# Patient Record
Sex: Male | Born: 1955 | Race: White | Hispanic: No | Marital: Married | Smoking: Current every day smoker
Health system: Southern US, Community
[De-identification: ages and names within clinical notes are randomized; demographics above are authoritative.]

## PROBLEM LIST (undated history)

## (undated) DIAGNOSIS — I4510 Unspecified right bundle-branch block: Secondary | ICD-10-CM

## (undated) DIAGNOSIS — E785 Hyperlipidemia, unspecified: Secondary | ICD-10-CM

## (undated) DIAGNOSIS — M1990 Unspecified osteoarthritis, unspecified site: Secondary | ICD-10-CM

## (undated) HISTORY — PX: COLONOSCOPY: SHX174

## (undated) HISTORY — DX: Hyperlipidemia, unspecified: E78.5

## (undated) HISTORY — DX: Unspecified right bundle-branch block: I45.10

---

## 2013-01-19 ENCOUNTER — Encounter: Payer: Self-pay | Admitting: Emergency Medicine

## 2013-01-19 ENCOUNTER — Ambulatory Visit (INDEPENDENT_AMBULATORY_CARE_PROVIDER_SITE_OTHER): Payer: PRIVATE HEALTH INSURANCE | Admitting: Emergency Medicine

## 2013-01-19 VITALS — BP 128/70 | HR 58 | Temp 98.7°F | Resp 18 | Ht 68.5 in | Wt 199.0 lb

## 2013-01-19 DIAGNOSIS — Z0000 Encounter for general adult medical examination without abnormal findings: Secondary | ICD-10-CM

## 2013-01-19 LAB — POCT CBC
GRANULOCYTE PERCENT: 55.8 % (ref 37–80)
HEMATOCRIT: 46.6 % (ref 43.5–53.7)
Hemoglobin: 14.9 g/dL (ref 14.1–18.1)
LYMPH, POC: 3 (ref 0.6–3.4)
MCH: 30.7 pg (ref 27–31.2)
MCHC: 32 g/dL (ref 31.8–35.4)
MCV: 95.8 fL (ref 80–97)
MID (CBC): 0.7 (ref 0–0.9)
MPV: 9.7 fL (ref 0–99.8)
PLATELET COUNT, POC: 189 10*3/uL (ref 142–424)
POC Granulocyte: 4.7 (ref 2–6.9)
POC LYMPH %: 36 % (ref 10–50)
POC MID %: 8.2 %M (ref 0–12)
RBC: 4.86 M/uL (ref 4.69–6.13)
RDW, POC: 13.4 %
WBC: 8.4 10*3/uL (ref 4.6–10.2)

## 2013-01-19 LAB — COMPREHENSIVE METABOLIC PANEL
ALT: 32 U/L (ref 0–53)
AST: 19 U/L (ref 0–37)
Albumin: 4.6 g/dL (ref 3.5–5.2)
Alkaline Phosphatase: 53 U/L (ref 39–117)
BILIRUBIN TOTAL: 0.7 mg/dL (ref 0.3–1.2)
BUN: 14 mg/dL (ref 6–23)
CO2: 30 mEq/L (ref 19–32)
CREATININE: 0.71 mg/dL (ref 0.50–1.35)
Calcium: 10 mg/dL (ref 8.4–10.5)
Chloride: 101 mEq/L (ref 96–112)
Glucose, Bld: 101 mg/dL — ABNORMAL HIGH (ref 70–99)
Potassium: 4.8 mEq/L (ref 3.5–5.3)
SODIUM: 140 meq/L (ref 135–145)
TOTAL PROTEIN: 7.4 g/dL (ref 6.0–8.3)

## 2013-01-19 LAB — POCT URINALYSIS DIPSTICK
Bilirubin, UA: NEGATIVE
Blood, UA: NEGATIVE
Glucose, UA: NEGATIVE
KETONES UA: NEGATIVE
Leukocytes, UA: NEGATIVE
Nitrite, UA: NEGATIVE
PH UA: 6
Protein, UA: NEGATIVE
SPEC GRAV UA: 1.015
Urobilinogen, UA: 0.2

## 2013-01-19 LAB — POCT UA - MICROSCOPIC ONLY
Bacteria, U Microscopic: NEGATIVE
CRYSTALS, UR, HPF, POC: NEGATIVE
Casts, Ur, LPF, POC: NEGATIVE
MUCUS UA: NEGATIVE
YEAST UA: NEGATIVE

## 2013-01-19 LAB — LIPID PANEL
Cholesterol: 203 mg/dL — ABNORMAL HIGH (ref 0–200)
HDL: 37 mg/dL — AB (ref >39)
LDL Cholesterol: 132 mg/dL — ABNORMAL HIGH (ref 0–99)
TRIGLYCERIDES: 171 mg/dL — AB (ref <150)
Total CHOL/HDL Ratio: 5.5 Ratio
VLDL: 34 mg/dL (ref 0–40)

## 2013-01-19 LAB — IFOBT (OCCULT BLOOD): IFOBT: NEGATIVE

## 2013-01-19 LAB — TSH: TSH: 0.908 u[IU]/mL (ref 0.350–4.500)

## 2013-01-19 LAB — PSA: PSA: 1.03 ng/mL (ref <=4.00)

## 2013-01-19 MED ORDER — HYDROCORTISONE ACETATE 25 MG RE SUPP: 25.0000 mg | Freq: Two times a day (BID) | RECTAL | Status: DC

## 2013-01-19 NOTE — Progress Notes (Addendum)
[facility] [address], [place] [address] [phone]  Date:  01/19/2013   Name:  Raymond Velez   DOB:  1955/04/03   MRN:  [id number]  PCP:  No primary provider on file.    Chief Complaint: No chief complaint on file.   History of Present Illness:  Raymond Velez is a 58 y.o. very pleasant male patient who presents with the following:  For wellness examination. No medication. Daily smoker.  Overdue for colonoscopy.  Works as a [employer].  Denies other complaint or health concern today.   There are no active problems to display for this patient.   No past medical history on file.  No past surgical history on file.  History  Substance Use Topics  . Smoking status: Current Every Day Smoker    Types: Cigarettes  . Smokeless tobacco: Not on file  . Alcohol Use: Not on file    No family history on file.  Allergies not on file  Medication list has been reviewed and updated.  No current outpatient prescriptions on file prior to visit.   No current facility-administered medications on file prior to visit.    Review of Systems:  As per HPI, otherwise negative.    Physical Examination: Filed Vitals:   01/19/13 0800  BP: 128/70  Pulse: 58  Temp: 98.7 F (37.1 C)  Resp: 18   Filed Vitals:   01/19/13 0800  Height: 5' 8.5" (1.74 m)  Weight: 199 lb (90.266 kg)   Body mass index is 29.81 kg/(m^2). Ideal Body Weight: Weight in (lb) to have BMI = 25: 166.5  GEN: WDWN, NAD, Non-toxic, A & O x 3 HEENT: Atraumatic, Normocephalic. Neck supple. No masses, No LAD. Ears and Nose: No external deformity. CV: RRR, No M/G/R. No JVD. No thrill. No extra heart sounds. PULM: CTA B, no wheezes, crackles, rhonchi. No retractions. No resp. distress. No accessory muscle use. ABD: S, NT, ND, +BS. No rebound. No HSM. EXTR: No c/c/e NEURO Normal gait.  PSYCH: Normally interactive. Conversant. Not depressed or anxious appearing.  Calm demeanor.   DRE:  External hemorrhoids  Assessment and Plan: Hemorrhoids Labs Follow up pending labs Stop smoking   Signed,  Phillips Odor, MD   Results for orders placed in visit on 01/19/13  PSA      Result Value Range   PSA 1.03  <=4.00 ng/mL  TSH      Result Value Range   TSH 0.908  0.350 - 4.500 uIU/mL  LIPID PANEL      Result Value Range   Cholesterol 203 (*) 0 - 200 mg/dL   Triglycerides 295 (*) <150 mg/dL   HDL 37 (*) >62 mg/dL   Total CHOL/HDL Ratio 5.5     VLDL 34  0 - 40 mg/dL   LDL Cholesterol 130 (*) 0 - 99 mg/dL  COMPREHENSIVE METABOLIC PANEL      Result Value Range   Sodium 140  135 - 145 mEq/L   Potassium 4.8  3.5 - 5.3 mEq/L   Chloride 101  96 - 112 mEq/L   CO2 30  19 - 32 mEq/L   Glucose, Bld 101 (*) 70 - 99 mg/dL   BUN 14  6 - 23 mg/dL   Creat 8.65  7.84 - 6.96 mg/dL   Total Bilirubin 0.7  0.3 - 1.2 mg/dL   Alkaline Phosphatase 53  39 - 117 U/L   AST 19  0 - 37 U/L  ALT 32  0 - 53 U/L   Total Protein 7.4  6.0 - 8.3 g/dL   Albumin 4.6  3.5 - 5.2 g/dL   Calcium 16.110.0  8.4 - 09.610.5 mg/dL  POCT CBC      Result Value Range   WBC 8.4  4.6 - 10.2 K/uL   Lymph, poc 3.0  0.6 - 3.4   POC LYMPH PERCENT 36.0  10 - 50 %L   MID (cbc) 0.7  0 - 0.9   POC MID % 8.2  0 - 12 %M   POC Granulocyte 4.7  2 - 6.9   Granulocyte percent 55.8  37 - 80 %G   RBC 4.86  4.69 - 6.13 M/uL   Hemoglobin 14.9  14.1 - 18.1 g/dL   HCT, POC 04.546.6  40.943.5 - 53.7 %   MCV 95.8  80 - 97 fL   MCH, POC 30.7  27 - 31.2 pg   MCHC 32.0  31.8 - 35.4 g/dL   RDW, POC 81.113.4     Platelet Count, POC 189  142 - 424 K/uL   MPV 9.7  0 - 99.8 fL  POCT URINALYSIS DIPSTICK      Result Value Range   Color, UA yellow     Clarity, UA clear     Glucose, UA neg     Bilirubin, UA neg     Ketones, UA neg     Spec Grav, UA 1.015     Blood, UA neg     pH, UA 6.0     Protein, UA neg     Urobilinogen, UA 0.2     Nitrite, UA neg     Leukocytes, UA Negative    POCT UA - MICROSCOPIC ONLY      Result Value  Range   WBC, Ur, HPF, POC 0-1     RBC, urine, microscopic 0-2     Bacteria, U Microscopic neg     Mucus, UA neg     Epithelial cells, urine per micros 0-3     Crystals, Ur, HPF, POC neg     Casts, Ur, LPF, POC neg     Yeast, UA neg    IFOBT (OCCULT BLOOD)      Result Value Range   IFOBT Negative

## 2013-01-19 NOTE — Patient Instructions (Signed)
Hemorrhoids Hemorrhoids are swollen veins around the rectum or anus. There are two types of hemorrhoids:   Internal hemorrhoids. These occur in the veins just inside the rectum. They may poke through to the outside and become irritated and painful.  External hemorrhoids. These occur in the veins outside the anus and can be felt as a painful swelling or hard lump near the anus. CAUSES  Pregnancy.   Obesity.   Constipation or diarrhea.   Straining to have a bowel movement.   Sitting for long periods on the toilet.  Heavy lifting or other activity that caused you to strain.  Anal intercourse. SYMPTOMS   Pain.   Anal itching or irritation.   Rectal bleeding.   Fecal leakage.   Anal swelling.   One or more lumps around the anus.  DIAGNOSIS  Your caregiver may be able to diagnose hemorrhoids by visual examination. Other examinations or tests that may be performed include:   Examination of the rectal area with a gloved hand (digital rectal exam).   Examination of anal canal using a small tube (scope).   A blood test if you have lost a significant amount of blood.  A test to look inside the colon (sigmoidoscopy or colonoscopy). TREATMENT Most hemorrhoids can be treated at home. However, if symptoms do not seem to be getting better or if you have a lot of rectal bleeding, your caregiver may perform a procedure to help make the hemorrhoids get smaller or remove them completely. Possible treatments include:   Placing a rubber band at the base of the hemorrhoid to cut off the circulation (rubber band ligation).   Injecting a chemical to shrink the hemorrhoid (sclerotherapy).   Using a tool to burn the hemorrhoid (infrared light therapy).   Surgically removing the hemorrhoid (hemorrhoidectomy).   Stapling the hemorrhoid to block blood flow to the tissue (hemorrhoid stapling).  HOME CARE INSTRUCTIONS   Eat foods with fiber, such as whole grains, beans,  nuts, fruits, and vegetables. Ask your doctor about taking products with added fiber in them (fibersupplements).  Increase fluid intake. Drink enough water and fluids to keep your urine clear or pale yellow.   Exercise regularly.   Go to the bathroom when you have the urge to have a bowel movement. Do not wait.   Avoid straining to have bowel movements.   Keep the anal area dry and clean. Use wet toilet paper or moist towelettes after a bowel movement.   Medicated creams and suppositories may be used or applied as directed.   Only take over-the-counter or prescription medicines as directed by your caregiver.   Take warm sitz baths for 15 20 minutes, 3 4 times a day to ease pain and discomfort.   Place ice packs on the hemorrhoids if they are tender and swollen. Using ice packs between sitz baths may be helpful.   Put ice in a plastic bag.   Place a towel between your skin and the bag.   Leave the ice on for 15 20 minutes, 3 4 times a day.   Do not use a donut-shaped pillow or sit on the toilet for long periods. This increases blood pooling and pain.  SEEK MEDICAL CARE IF:  You have increasing pain and swelling that is not controlled by treatment or medicine.  You have uncontrolled bleeding.  You have difficulty or you are unable to have a bowel movement.  You have pain or inflammation outside the area of the hemorrhoids. MAKE SURE YOU:    Understand these instructions.  Will watch your condition.  Will get help right away if you are not doing well or get worse. Document Released: 12/25/1999 Document Revised: 12/14/2011 Document Reviewed: 11/01/2011 [employer] Patient Information 2014 [employer].  

## 2013-01-19 NOTE — Addendum Note (Signed)
Addended by: Vira AgarFARRINGTON, Chizuko Trine L on: 01/19/2013 08:36 AM   Modules accepted: Orders

## 2013-02-21 ENCOUNTER — Telehealth: Payer: Self-pay

## 2013-02-21 NOTE — Telephone Encounter (Signed)
Patient was seen for a CPE in January and he states he has not gotten any of his results back yet. States he would like a letter with all the documentation. States that a phone call is not sufficient enough.  [phone]

## 2013-03-15 ENCOUNTER — Encounter: Payer: Self-pay | Admitting: Emergency Medicine

## 2013-05-13 ENCOUNTER — Encounter: Payer: Self-pay | Admitting: Internal Medicine

## 2013-05-13 ENCOUNTER — Ambulatory Visit (INDEPENDENT_AMBULATORY_CARE_PROVIDER_SITE_OTHER): Payer: PRIVATE HEALTH INSURANCE | Admitting: Internal Medicine

## 2013-05-13 ENCOUNTER — Ambulatory Visit: Payer: PRIVATE HEALTH INSURANCE | Admitting: Internal Medicine

## 2013-05-13 VITALS — BP 120/74 | HR 75 | Temp 97.8°F | Resp 18 | Ht 68.0 in | Wt 196.0 lb

## 2013-05-13 VITALS — BP 120/74 | HR 75 | Temp 97.8°F | Resp 16 | Ht 68.0 in | Wt 196.0 lb

## 2013-05-13 DIAGNOSIS — B958 Unspecified staphylococcus as the cause of diseases classified elsewhere: Secondary | ICD-10-CM

## 2013-05-13 DIAGNOSIS — Z0289 Encounter for other administrative examinations: Secondary | ICD-10-CM

## 2013-05-13 DIAGNOSIS — L738 Other specified follicular disorders: Secondary | ICD-10-CM

## 2013-05-13 DIAGNOSIS — L739 Follicular disorder, unspecified: Secondary | ICD-10-CM

## 2013-05-13 DIAGNOSIS — L678 Other hair color and hair shaft abnormalities: Secondary | ICD-10-CM

## 2013-05-13 MED ORDER — DOXYCYCLINE HYCLATE 100 MG PO TABS: 100.0000 mg | ORAL_TABLET | Freq: Two times a day (BID) | ORAL | Status: DC

## 2013-05-13 NOTE — Progress Notes (Signed)
   Subjective:    Patient ID: Raymond Velez, male    DOB: February 07, 1955, 58 y.o.   MRN: [id number]  HPI Healthy, on no meds.   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

## 2013-05-13 NOTE — Patient Instructions (Signed)
Folliculitis  Folliculitis is redness, soreness, and swelling (inflammation) of the hair follicles. This condition can occur anywhere on the body. People with weakened immune systems, diabetes, or obesity have a greater risk of getting folliculitis. CAUSES  Bacterial infection. This is the most common cause.  Fungal infection.  Viral infection.  Contact with certain chemicals, especially oils and tars. Long-term folliculitis can result from bacteria that live in the nostrils. The bacteria may trigger multiple outbreaks of folliculitis over time. SYMPTOMS Folliculitis most commonly occurs on the scalp, thighs, legs, back, buttocks, and areas where hair is shaved frequently. An early sign of folliculitis is a small, white or yellow, pus-filled, itchy lesion (pustule). These lesions appear on a red, inflamed follicle. They are usually less than 0.2 inches (5 mm) wide. When there is an infection of the follicle that goes deeper, it becomes a boil or furuncle. A group of closely packed boils creates a larger lesion (carbuncle). Carbuncles tend to occur in hairy, sweaty areas of the body. DIAGNOSIS  Your caregiver can usually tell what is wrong by doing a physical exam. A sample may be taken from one of the lesions and tested in a lab. This can help determine what is causing your folliculitis. TREATMENT  Treatment may include:  Applying warm compresses to the affected areas.  Taking antibiotic medicines orally or applying them to the skin.  Draining the lesions if they contain a large amount of pus or fluid.  Laser hair removal for cases of long-lasting folliculitis. This helps to prevent regrowth of the hair. HOME CARE INSTRUCTIONS  Apply warm compresses to the affected areas as directed by your caregiver.  If antibiotics are prescribed, take them as directed. Finish them even if you start to feel better.  You may take over-the-counter medicines to relieve itching.  Do not shave  irritated skin.  Follow up with your caregiver as directed. SEEK IMMEDIATE MEDICAL CARE IF:   You have increasing redness, swelling, or pain in the affected area.  You have a fever. MAKE SURE YOU:  Understand these instructions.  Will watch your condition.  Will get help right away if you are not doing well or get worse. Document Released: 03/07/2001 Document Revised: 06/28/2011 Document Reviewed: 03/29/2011 [facility] Patient Information 2014 [place], [place]. MRSA Overview MRSA stands for methicillin-resistant Staphylococcus aureus. It is a type of bacteria that is resistant to some common antibiotics. It can cause infections in the skin and many other places in the body. Staphylococcus aureus, often called "staph," is a bacteria that normally lives on the skin or in the nose. Staph on the surface of the skin or in the nose does not cause problems. However, if the staph enters the body through a cut, wound, or break in the skin, an infection can happen. Up until recently, infections with the MRSA type of staph mainly occurred in hospitals and other healthcare settings. There are now increasing problems with MRSA infections in the community as well. Infections with MRSA may be very serious or even life-threatening. Most MRSA infections are acquired in one of two ways:  Healthcare-associated MRSA (HA-MRSA)  This can be acquired by people in any healthcare setting. MRSA can be a big problem for hospitalized people, people in nursing homes, people in rehabilitation facilities, people with weakened immune systems, dialysis patients, and those who have had surgery.  Community-associated MRSA (CA-MRSA)  Community spread of MRSA is becoming more common. It is known to spread in crowded settings, in jails and prisons, and  in situations where there is close skin-to-skin contact, such as during sporting events or in locker rooms. MRSA can be spread through shared items, such as children's toys,  razors, towels, or sports equipment. CAUSES  All staph, including MRSA, are normally harmless unless they enter the body through a scratch, cut, or wound, such as with surgery. All staph, including MRSA, can be spread from person-to-person by touching contaminated objects or through direct contact. SPECIAL GROUPS MRSA can present problems for special groups of people. Some of these groups include:  Breastfeeding women.  The most common problem is MRSA infection of the breast (mastitis). There is evidence that MRSA can be passed to an infant from infected breast milk. Your caregiver may recommend that you stop breastfeeding until the mastitis is under control.  If you are breastfeeding and have a MRSA infection in a place other than the breast, you may usually continue breastfeeding while under treatment. If taking antibiotics, ask your caregiver if it is safe to continue breastfeeding while taking your prescribed medicines.  Neonates (babies from birth to 76 month old) and infants (babies from 38 month to 58 year old).  There is evidence that MRSA can be passed to a newborn at birth if the mother has MRSA on the skin, in or around the birth canal, or an infection in the uterus, cervix, or vagina. MRSA infection can have the same appearance as a normal newborn or infant rash or several other skin infections. This can make it hard to diagnose MRSA.  Immune compromised people.  If you have an immune system problem, you may have a higher chance of developing a MRSA infection.  People after any type of surgery.  Staph in general, including MRSA, is the most common cause of infections occurring at the site of recent surgery.  People on long-term steroid medicines.  These kinds of medicines can lower your resistance to infection. This can increase your chance of getting MRSA.  People who have had frequent hospitalizations, live in nursing homes or other residential care facilities, have venous or  urinary catheters, or have taken multiple courses of antibiotic therapy for any reason. DIAGNOSIS  Diagnosis of MRSA is done by cultures of fluid samples that may come from:  Swabs taken from cuts or wounds in infected areas.  Nasal swabs.  Saliva or deep cough specimens from the lungs (sputum).  Urine.  Blood. Many people are "colonized" with MRSA but have no signs of infection. This means that people carry the MRSA germ on their skin or in their nose and may never develop MRSA infection.  TREATMENT  Treatment varies and is based on how serious, how deep, or how extensive the infection is. For example:  Some skin infections, such as a small boil or abscess, may be treated by draining yellowish-white fluid (pus) from the site of the infection.  Deeper or more widespread soft tissue infections are usually treated with surgery to drain pus and with antibiotic medicine given by vein or by mouth. This may be recommended even if you are pregnant.  Serious infections may require a hospital stay. If antibiotics are given, they may be needed for several weeks. PREVENTION  Because many people are colonized with staph, including MRSA, preventing the spread of the bacteria from person-to-person is most important. The best way to prevent the spread of bacteria and other germs is through proper hand washing or by using alcohol-based hand disinfectants. The following are other ways to help prevent MRSA infection  within the hospital and community settings.   Healthcare settings:  Strict hand washing or hand disinfection procedures need to be followed before and after touching every patient.  Patients infected with MRSA are placed in isolation to prevent the spread of the bacteria.  Healthcare workers need to wear disposable gowns and gloves when touching or caring for patients infected with MRSA. Visitors may also be asked to wear a gown and gloves.  Hospital surfaces need to be disinfected  frequently.  Community settings:  NIKEWash your hands frequently with soap and water for at least 15 seconds. Otherwise, use alcohol-based hand disinfectants when soap and water is not available.  Make sure people who live with you wash their hands often, too.  Do not share personal items. For example, avoid sharing razors and other personal hygiene items, towels, clothing, and athletic equipment.  Wash and dry your clothes and bedding at the warmest temperatures recommended on the labels.  Keep wounds covered. Pus from infected sores may contain MRSA and other bacteria. Keep cuts and abrasions clean and covered with germ-free (sterile), dry bandages until they are healed.  If you have a wound that appears infected, ask your caregiver if a culture for MRSA and other bacteria should be done.  If you are breastfeeding, talk to your caregiver about MRSA. You may be asked to temporarily stop breastfeeding. HOME CARE INSTRUCTIONS   Take your antibiotics as directed. Finish them even if you start to feel better.  Avoid close contact with those around you as much as possible. Do not use towels, razors, toothbrushes, bedding, or other items that will be used by others.  To fight the infection, follow your caregiver's instructions for wound care. Wash your hands before and after changing your bandages.  If you have an intravascular device, such as a catheter, make sure you know how to care for it.  Be sure to tell any healthcare providers that you have MRSA so they are aware of your infection. SEEK IMMEDIATE MEDICAL CARE IF:   The infection appears to be getting worse. Signs include:  Increased warmth, redness, or tenderness around the wound site.  A red line that extends from the infection site.  A dark color in the area around the infection.  Wound drainage that is tan, yellow, or green.  A bad smell coming from the wound.  You feel sick to your stomach (nauseous) and throw up (vomit)  or cannot keep medicine down.  You have a fever.  Your baby is older than 3 months with a rectal temperature of 102 F (38.9 C) or higher.  Your baby is 933 months old or younger with a rectal temperature of 100.4 F (38 C) or higher.  You have difficulty breathing. MAKE SURE YOU:   Understand these instructions.  Will watch your condition.  Will get help right away if you are not doing well or get worse. Document Released: 12/27/2004 Document Revised: 03/21/2011 Document Reviewed: 03/31/2010 [facility] : [facility]ExitCare Patient Information 2014 [place] RidgeExitCare, MarylandLLC.

## 2013-05-13 NOTE — Progress Notes (Signed)
Sp gr-1.010, protein-neg, glucose-neg, blood-neg 

## 2013-05-13 NOTE — Patient Instructions (Signed)
Immunization Schedule, Adult  Influenza vaccine.  All adults should be immunized every year.  All adults, including pregnant women and people with hives-only allergy to eggs can receive the inactivated influenza (IIV) vaccine.  Adults aged 57 49 years can receive the recombinant influenza (RIV) vaccine. The RIV vaccine does not contain any egg protein.  Adults aged 65 years or older can receive the standard-dose IIV or the high-dose IIV.  Tetanus, diphtheria, and acellular pertussis (Td, Tdap) vaccine.  Pregnant women should receive 1 dose of Tdap vaccine during each pregnancy. The dose should be obtained regardless of the length of time since the last dose. Immunization is preferred during the 27th to 36th week of gestation.  An adult who has not previously received Tdap or who does not know his or her vaccine status should receive 1 dose of Tdap. This initial dose should be followed by tetanus and diphtheria toxoids (Td) booster doses every 10 years.  Adults with an unknown or incomplete history of completing a 3-dose immunization series with Td-containing vaccines should begin or complete a primary immunization series including a Tdap dose.  Adults should receive a Td booster every 10 years.  Varicella vaccine.  An adult without evidence of immunity to varicella should receive 2 doses or a second dose if he or she has previously received 1 dose.  Pregnant females who do not have evidence of immunity should receive the first dose after pregnancy. This first dose should be obtained before leaving the health care facility. The second dose should be obtained 4 8 weeks after the first dose.  Human papillomavirus (HPV) vaccine.  Females aged 13 26 years who have not received the vaccine previously should obtain the 3-dose series.  The vaccine is not recommended for use in pregnant females. However, pregnancy testing is not needed before receiving a dose. If a male is found to be  pregnant after receiving a dose, no treatment is needed. In that case, the remaining doses should be delayed until after the pregnancy.  Males aged 13 21 years who have not received the vaccine previously should receive the 3-dose series. Males aged 22 26 years may be immunized.  Immunization is recommended through the age of 26 years for any male who has sex with males and did not get any or all doses earlier.  Immunization is recommended for any person with an immunocompromised condition through the age of 26 years if he or she did not get any or all doses earlier.  During the 3-dose series, the second dose should be obtained 4 8 weeks after the first dose. The third dose should be obtained 24 weeks after the first dose and 16 weeks after the second dose.  Zoster vaccine.  One dose is recommended for adults aged 60 years or older unless certain conditions are present.  Measles, mumps, and rubella (MMR) vaccine.  Adults born before 1957 generally are considered immune to measles and mumps.  Adults born in 1957 or later should have 1 or more doses of MMR vaccine unless there is a contraindication to the vaccine or there is laboratory evidence of immunity to each of the three diseases.  A routine second dose of MMR vaccine should be obtained at least 28 days after the first dose for students attending postsecondary schools, health care workers, or international travelers.  People who received inactivated measles vaccine or an unknown type of measles vaccine during 1963 1967 should receive 2 doses of MMR vaccine.  People who received   inactivated mumps vaccine or an unknown type of mumps vaccine before 1979 and are at high risk for mumps infection should consider immunization with 2 doses of MMR vaccine.  For females of childbearing age, rubella immunity should be determined. If there is no evidence of immunity, females who are not pregnant should be vaccinated. If there is no evidence of  immunity, females who are pregnant should delay immunization until after pregnancy.  Unvaccinated health care workers born before 45 who lack laboratory evidence of measles, mumps, or rubella immunity or laboratory confirmation of disease should consider measles and mumps immunization with 2 doses of MMR vaccine or rubella immunization with 1 dose of MMR vaccine.  Pneumococcal 13-valent conjugate (PCV13) vaccine.  When indicated, a person who is uncertain of his or her immunization history and has no record of immunization should receive the PCV13 vaccine.  An adult aged 38 years or older who has certain medical conditions and has not been previously immunized should receive 1 dose of PCV13 vaccine. This PCV13 should be followed with a dose of pneumococcal polysaccharide (PPSV23) vaccine. The PPSV23 vaccine dose should be obtained at least 8 weeks after the dose of PCV13 vaccine.  An adult aged 33 years or older who has certain medical conditions and previously received 1 or more doses of PPSV23 vaccine should receive 1 dose of PCV13. The PCV13 vaccine dose should be obtained 1 or more years after the last PPSV23 vaccine dose.  Pneumococcal polysaccharide (PPSV23) vaccine.  When PCV13 is also indicated, PCV13 should be obtained first.  All adults aged 71 years and older should be immunized.  An adult younger than age 74 years who has certain medical conditions should be immunized.  Any person who resides in a nursing home or long-term care facility should be immunized.  An adult smoker should be immunized.  People with an immunocompromised condition and certain other conditions should receive both PCV13 and PPSV23 vaccines.  People with human immunodeficiency virus (HIV) infection should be immunized as soon as possible after diagnosis.  Immunization during chemotherapy or radiation therapy should be avoided.  Routine use of PPSV23 vaccine is not recommended for American Indians,  [place] Natives, or people younger than 65 years unless there are medical conditions that require PPSV23 vaccine.  When indicated, people who have unknown immunization and have no record of immunization should receive PPSV23 vaccine.  One-time revaccination 5 years after the first dose of PPSV23 is recommended for people aged 22 64 years who have chronic kidney failure, nephrotic syndrome, asplenia, or immunocompromised conditions.  People who received 1 2 doses of PPSV23 before age 69 years should receive another dose of PPSV23 vaccine at age 70 years or later if at least 5 years have passed since the previous dose.  Doses of PPSV23 are not needed for people immunized with PPSV23 at or after age 66 years.  Meningococcal vaccine.  Adults with asplenia or persistent complement component deficiencies should receive 2 doses of quadrivalent meningococcal conjugate (MenACWY-D) vaccine. The doses should be obtained at least 2 months apart.  Microbiologists working with certain meningococcal bacteria, [place] recruits, people at risk during an outbreak, and people who travel to or live in countries with a high rate of meningitis should be immunized.  A first-year college student up through age 74 years who is living in a residence hall should receive a dose if he or she did not receive a dose on or after his or her 16th birthday.  Adults who have  certain high-risk conditions should receive one or more doses of vaccine.  Hepatitis A vaccine.  Adults who wish to be protected from this disease, have certain high-risk conditions, work with hepatitis A-infected animals, work in hepatitis A research labs, or travel to or work in countries with a high rate of hepatitis A should be immunized.  Adults who were previously unvaccinated and who anticipate close contact with an international adoptee during the first 60 days after arrival in the [place] from a country with a high rate of hepatitis A should  be immunized.  Hepatitis B vaccine.  Adults who wish to be protected from this disease, have certain high-risk conditions, may be exposed to blood or other infectious body fluids, are household contacts or sex partners of hepatitis B positive people, are clients or workers in certain care facilities, or travel to or work in countries with a high rate of hepatitis B should be immunized.  Haemophilus influenzae type b (Hib) vaccine.  A previously unvaccinated person with asplenia or sickle cell disease or having a scheduled splenectomy should receive 1 dose of Hib vaccine.  Regardless of previous immunization, a recipient of a hematopoietic stem cell transplant should receive a 3-dose series 6 12 months after his or her successful transplant.  Hib vaccine is not recommended for adults with HIV infection. Document Released: 03/19/2003 Document Revised: 04/23/2012 Document Reviewed: 02/14/2012 [facility] Patient Information 2014 [place], [place].

## 2013-05-13 NOTE — Progress Notes (Signed)
   Subjective:    Patient ID: Raymond Velez, male    DOB: Aug 27, 1955, 58 y.o.   MRN: [id number]  HPI 58 yr old male is today with complaints of a rash between his right testicle and anus area for the last 3-4 weeks. He states he has tried neosporin with no relief. He states he has not ran a fever or changes laundry detergent, soaps or lotion in the last 3-4 weeks.    Review of Systems     Objective:   Physical Exam        Assessment & Plan:  Folliculitis Soap and water/Doxycycline for 2 weeks

## 2014-02-20 ENCOUNTER — Ambulatory Visit (INDEPENDENT_AMBULATORY_CARE_PROVIDER_SITE_OTHER): Payer: PRIVATE HEALTH INSURANCE

## 2014-02-20 ENCOUNTER — Ambulatory Visit (INDEPENDENT_AMBULATORY_CARE_PROVIDER_SITE_OTHER): Payer: PRIVATE HEALTH INSURANCE | Admitting: Urgent Care

## 2014-02-20 VITALS — BP 128/80 | HR 59 | Temp 97.7°F | Resp 18 | Ht 68.5 in | Wt 206.4 lb

## 2014-02-20 DIAGNOSIS — R059 Cough, unspecified: Secondary | ICD-10-CM

## 2014-02-20 DIAGNOSIS — F17200 Nicotine dependence, unspecified, uncomplicated: Secondary | ICD-10-CM

## 2014-02-20 DIAGNOSIS — Z720 Tobacco use: Secondary | ICD-10-CM

## 2014-02-20 DIAGNOSIS — E785 Hyperlipidemia, unspecified: Secondary | ICD-10-CM

## 2014-02-20 DIAGNOSIS — R911 Solitary pulmonary nodule: Secondary | ICD-10-CM

## 2014-02-20 DIAGNOSIS — R05 Cough: Secondary | ICD-10-CM

## 2014-02-20 DIAGNOSIS — Z0000 Encounter for general adult medical examination without abnormal findings: Secondary | ICD-10-CM

## 2014-02-20 DIAGNOSIS — J988 Other specified respiratory disorders: Secondary | ICD-10-CM

## 2014-02-20 DIAGNOSIS — R350 Frequency of micturition: Secondary | ICD-10-CM

## 2014-02-20 LAB — CBC
HEMATOCRIT: 44.1 % (ref 39.0–52.0)
HEMOGLOBIN: 15.2 g/dL (ref 13.0–17.0)
MCH: 31.1 pg (ref 26.0–34.0)
MCHC: 34.5 g/dL (ref 30.0–36.0)
MCV: 90.2 fL (ref 78.0–100.0)
MPV: 11.9 fL (ref 8.6–12.4)
Platelets: 200 10*3/uL (ref 150–400)
RBC: 4.89 MIL/uL (ref 4.22–5.81)
RDW: 13.1 % (ref 11.5–15.5)
WBC: 7.6 10*3/uL (ref 4.0–10.5)

## 2014-02-20 LAB — COMPREHENSIVE METABOLIC PANEL
ALK PHOS: 56 U/L (ref 39–117)
ALT: 27 U/L (ref 0–53)
AST: 20 U/L (ref 0–37)
Albumin: 4.6 g/dL (ref 3.5–5.2)
BUN: 12 mg/dL (ref 6–23)
CO2: 30 meq/L (ref 19–32)
Calcium: 10 mg/dL (ref 8.4–10.5)
Chloride: 102 mEq/L (ref 96–112)
Creat: 0.78 mg/dL (ref 0.50–1.35)
Glucose, Bld: 102 mg/dL — ABNORMAL HIGH (ref 70–99)
POTASSIUM: 4.7 meq/L (ref 3.5–5.3)
SODIUM: 138 meq/L (ref 135–145)
Total Bilirubin: 0.6 mg/dL (ref 0.2–1.2)
Total Protein: 7.6 g/dL (ref 6.0–8.3)

## 2014-02-20 LAB — POCT URINALYSIS DIPSTICK
Bilirubin, UA: NEGATIVE
Glucose, UA: NEGATIVE
KETONES UA: NEGATIVE
Nitrite, UA: NEGATIVE
PH UA: 6.5
Protein, UA: NEGATIVE
RBC UA: NEGATIVE
SPEC GRAV UA: 1.01
Urobilinogen, UA: 0.2

## 2014-02-20 LAB — LIPID PANEL
CHOL/HDL RATIO: 5.6 ratio
Cholesterol: 192 mg/dL (ref 0–200)
HDL: 34 mg/dL — AB (ref >39)
LDL Cholesterol: 122 mg/dL — ABNORMAL HIGH (ref 0–99)
Triglycerides: 180 mg/dL — ABNORMAL HIGH (ref <150)
VLDL: 36 mg/dL (ref 0–40)

## 2014-02-20 LAB — POCT UA - MICROSCOPIC ONLY
BACTERIA, U MICROSCOPIC: NEGATIVE
CRYSTALS, UR, HPF, POC: NEGATIVE
Casts, Ur, LPF, POC: NEGATIVE
Epithelial cells, urine per micros: NEGATIVE
Mucus, UA: NEGATIVE
RBC, URINE, MICROSCOPIC: NEGATIVE
Yeast, UA: NEGATIVE

## 2014-02-20 LAB — POCT GLYCOSYLATED HEMOGLOBIN (HGB A1C): HEMOGLOBIN A1C: 5.7

## 2014-02-20 MED ORDER — AZITHROMYCIN 500 MG PO TABS: 500.0000 mg | ORAL_TABLET | Freq: Every day | ORAL | Status: DC

## 2014-02-20 NOTE — Progress Notes (Signed)
MRN: [id number]  Subjective:   [name] is a 59 y.o. male presenting for annual physical exam and cough, urinary frequency.  Medical care team includes:  PCP: No PCP Per Patient, last physical 1 year ago Vision: Eye care appointment ~2 years ago, wears glasses, denies vision changes. Dental: Wears partial dentures, does not get regular dental care. Specialists:   Colonoscopy: Has had 3, had a repeat 2015, with 3 non-cancerous polyps excised, recommended repeat in 3 years.  Concerns: Urinary frequency - reports 3 months history of difficulty urinating, weakened stream, occasional painful erections. Has not tried any interventions. Denies fever, rash, swelling, erythema, discharge, dysuria, post-mic dribble, nocturia, lesions, testicular masses. Patient is married, has monogamous relationship to best of his knowledge, denies history of STI. Previously had similar symptoms, saw urologist ~6 years ago and was treated with an antibiotic, symptoms resolved thereafter. Of note, patient [facility], 02/20/2014, will go straight there after today.  Cough - reports several week history of dry hacking cough, has coughing fits, causes him to "swallow spit". Has not tried any interventions. ROS as above, also denies sinus pain, congestion, ear pain, itchy or watery eyes, tooth pain, sore throat, voice hoarseness, night sweats, chest pain, chest tightness, wheezing, shob, n/v, abdominal pain, weight loss. Denies history of asthma, COPD, allergies. Patient currently smokes >1.5ppd, has 20+ pack smoking history. Patient had quit previously but picked up smoking again ~5 years ago. He admits that this has been an issue with him and his family, promised he would quit for the new year and lied about it. Would like help to quit smoking.  Raymond Velez does not have any active problems on the problem list.  Health Maintenance: Diet is unhealthy, works as a  Hospital doctordriver for a living and eats whatever is convenient, exercises 1-2 times a week, fast-paced walking, admits that he should probably do more.  Immunizations: Does not get flu shot, last TDAP ~17 years ago  [place] currently takes Swiss Kriss herbal supplement as needed for constipation. He has No Known Allergies  His past medical history includes hyperlipidemia. History reviewed. No pertinent past surgical history.  Review of Systems  Constitutional: Negative for malaise/fatigue.  HENT: Negative for tinnitus.   Eyes: Negative for blurred vision and double vision.  Gastrointestinal: Negative for heartburn, constipation and blood in stool.  Musculoskeletal: Negative for back pain, joint pain and neck pain.  Neurological: Negative for dizziness, weakness and headaches.  Psychiatric/Behavioral: Negative for depression. The patient is not nervous/anxious and does not have insomnia.   Otherwise as above.   Objective:   Vitals: BP 128/80 mmHg  Pulse 59  Temp(Src) 97.7 F (36.5 C) (Oral)  Resp 18  Ht 5' 8.5" (1.74 m)  Wt 206 lb 6.4 oz (93.622 kg)  BMI 30.92 kg/m2  SpO2 99%  Physical Exam  Constitutional: He is oriented to person, place, and time and well-developed, well-nourished, and in no distress.  HENT:  TM's intact bilaterally, no effusions or erythema. Nasal passages patent, turbinates pink and moist. Oropharynx clear, wearing partial upper dentures, no tonsillar edema or exudates. Mucous membranes moist.  Eyes: Conjunctivae and EOM are normal. Pupils are equal, round, and reactive to light. Right eye exhibits no discharge. Left eye exhibits no discharge. No scleral icterus.  Neck: Normal range of motion. No thyromegaly (no masses palpated) present.  Cardiovascular: Normal rate, regular rhythm, normal heart sounds and intact distal pulses.  Exam reveals no gallop  and no friction rub.   No murmur heard. Pulmonary/Chest: Effort normal and breath sounds normal. No stridor. No  respiratory distress. He has no wheezes. He has no rales. He exhibits no tenderness.  Abdominal: Soft. Bowel sounds are normal. He exhibits no distension and no mass. There is no tenderness.  Genitourinary: He exhibits no abnormal testicular mass, no testicular tenderness, no abnormal scrotal mass, no scrotal tenderness and no epididymal tenderness. Cremasteric reflex is present. Penis exhibits no lesions and no edema. No discharge found.  Patient deferred digital rectal exam since he was going to have his prostate checked with urologist today.  Musculoskeletal: Normal range of motion. He exhibits no edema or tenderness.  Lymphadenopathy:    He has no cervical adenopathy.  Neurological: He is alert and oriented to person, place, and time. He has normal reflexes.  Skin: Skin is warm and dry. No rash noted. No erythema.  Hands with dry skin bilaterally.  Psychiatric: Mood and affect normal.   Results for orders placed or performed in visit on 02/20/14 (from the past 24 hour(s))  POCT urinalysis dipstick     Status: None   Collection Time: 02/20/14  9:30 AM  Result Value Ref Range   Color, UA yellow    Clarity, UA clear    Glucose, UA neg    Bilirubin, UA neg    Ketones, UA neg    Spec Grav, UA 1.010    Blood, UA neg    pH, UA 6.5    Protein, UA neg    Urobilinogen, UA 0.2    Nitrite, UA neg    Leukocytes, UA Trace   POCT glycosylated hemoglobin (Hb A1C)     Status: None   Collection Time: 02/20/14  9:30 AM  Result Value Ref Range   Hemoglobin A1C 5.7   POCT UA - Microscopic Only     Status: None   Collection Time: 02/20/14  9:30 AM  Result Value Ref Range   WBC, Ur, HPF, POC 0-3    RBC, urine, microscopic neg    Bacteria, U Microscopic neg    Mucus, UA neg    Epithelial cells, urine per micros neg    Crystals, Ur, HPF, POC neg    Casts, Ur, LPF, POC neg    Yeast, UA neg    [facility] reading (PRIMARY) by  Dr. Clelia Croft and PA-Tanikka Bresnan. Chest: Right lower lobe infiltrates. Degenerative  changes in thoracic spine.  Dg Chest 2 View  02/20/2014   CLINICAL DATA:  Cough.  EXAM: CHEST  2 VIEW  COMPARISON:  None.  FINDINGS: Mediastinum and hilar structures are normal. Lungs are clear. No pleural effusion or pneumothorax. Calcified right base pulmonary nodule noted consistent with granuloma. No acute bony abnormality .  IMPRESSION: Calcified right base nodule noted consistent with granuloma. No acute cardiopulmonary disease.   Electronically Signed   By: Maisie Fus  Register   On: 02/20/2014 10:09   Assessment and Plan :   1. PE (physical exam), annual - Stable, discussed healthy lifestyle, diet, exercise, preventative care, vaccinations, and addressed patient's concerns. Patient was reluctant to update TDap. Is not interested in flu vaccine. - Discussed smoking cessation and medication options. Patient will try Wellbutrin for this. - Plan for updating his TDAP at follow up in 6 weeks. Patient reluctant to do it today. - Labs pending, consider starting statin therapy for cholesterol. Framingham 10-year risk calculator at ~18% for this patient. - Return for repeat annual exam in 1 year.  2. Urinary  frequency - UA reassuring, will call patient to follow up or urology visit, consider dirty catch urine for STI check  3. Cough 4. Tobacco use disorder 5. Respiratory infection - Start Azithromycin for possible infectious process d/t abnormal chest x-ray, will follow up by phone as above, plan to start Wellburtin for smoking cessation.  UPDATE: chest x-ray over-read reported pulmonary nodule. No previous chest x-ray for comparison. Given extensive smoking history of symptoms, will obtain chest CT.  Wallis Bamberg, PA-C [facility] [phone] 02/20/2014 9:35 AM

## 2014-02-20 NOTE — Patient Instructions (Signed)
Smoking Cessation Quitting smoking is important to your health and has many advantages. However, it is not always easy to quit since nicotine is a very addictive drug. Oftentimes, people try 3 times or more before being able to quit. This document explains the best ways for you to prepare to quit smoking. Quitting takes hard work and a lot of effort, but you can do it. ADVANTAGES OF QUITTING SMOKING  You will live longer, feel better, and live better.  Your body will feel the impact of quitting smoking almost immediately.  Within 20 minutes, blood pressure decreases. Your pulse returns to its normal level.  After 8 hours, carbon monoxide levels in the blood return to normal. Your oxygen level increases.  After 24 hours, the chance of having a heart attack starts to decrease. Your breath, hair, and body stop smelling like smoke.  After 48 hours, damaged nerve endings begin to recover. Your sense of taste and smell improve.  After 72 hours, the body is virtually free of nicotine. Your bronchial tubes relax and breathing becomes easier.  After 2 to 12 weeks, lungs can hold more air. Exercise becomes easier and circulation improves.  The risk of having a heart attack, stroke, cancer, or lung disease is greatly reduced.  After 1 year, the risk of coronary heart disease is cut in half.  After 5 years, the risk of stroke falls to the same as a nonsmoker.  After 10 years, the risk of lung cancer is cut in half and the risk of other cancers decreases significantly.  After 15 years, the risk of coronary heart disease drops, usually to the level of a nonsmoker.  If you are pregnant, quitting smoking will improve your chances of having a healthy baby.  The people you live with, especially any children, will be healthier.  You will have extra money to spend on things other than cigarettes. QUESTIONS TO THINK ABOUT BEFORE ATTEMPTING TO QUIT You may want to talk about your answers with your  health care provider.  Why do you want to quit?  If you tried to quit in the past, what helped and what did not?  What will be the most difficult situations for you after you quit? How will you plan to handle them?  Who can help you through the tough times? Your family? Friends? A health care provider?  What pleasures do you get from smoking? What ways can you still get pleasure if you quit? Here are some questions to ask your health care provider:  How can you help me to be successful at quitting?  What medicine do you think would be best for me and how should I take it?  What should I do if I need more help?  What is smoking withdrawal like? How can I get information on withdrawal? GET READY  Set a quit date.  Change your environment by getting rid of all cigarettes, ashtrays, matches, and lighters in your home, car, or work. Do not let people smoke in your home.  Review your past attempts to quit. Think about what worked and what did not. GET SUPPORT AND ENCOURAGEMENT You have a better chance of being successful if you have help. You can get support in many ways.  Tell your family, friends, and coworkers that you are going to quit and need their support. Ask them not to smoke around you.  Get individual, group, or telephone counseling and support. Programs are available at local hospitals and health centers. Call   your local health department for information about programs in your area.  Spiritual beliefs and practices may help some smokers quit.  Download a "quit meter" on your computer to keep track of quit statistics, such as how long you have gone without smoking, cigarettes not smoked, and money saved.  Get a self-help book about quitting smoking and staying off tobacco. LEARN NEW SKILLS AND BEHAVIORS  Distract yourself from urges to smoke. Talk to someone, go for a walk, or occupy your time with a task.  Change your normal routine. Take a different route to work.  Drink tea instead of coffee. Eat breakfast in a different place.  Reduce your stress. Take a hot bath, exercise, or read a book.  Plan something enjoyable to do every day. Reward yourself for not smoking.  Explore interactive web-based programs that specialize in helping you quit. GET MEDICINE AND USE IT CORRECTLY Medicines can help you stop smoking and decrease the urge to smoke. Combining medicine with the above behavioral methods and support can greatly increase your chances of successfully quitting smoking.  Nicotine replacement therapy helps deliver nicotine to your body without the negative effects and risks of smoking. Nicotine replacement therapy includes nicotine gum, lozenges, inhalers, nasal sprays, and skin patches. Some may be available over-the-counter and others require a prescription.  Antidepressant medicine helps people abstain from smoking, but how this works is unknown. This medicine is available by prescription.  Nicotinic receptor partial agonist medicine simulates the effect of nicotine in your brain. This medicine is available by prescription. Ask your health care provider for advice about which medicines to use and how to use them based on your health history. Your health care provider will tell you what side effects to look out for if you choose to be on a medicine or therapy. Carefully read the information on the package. Do not use any other product containing nicotine while using a nicotine replacement product.  RELAPSE OR DIFFICULT SITUATIONS Most relapses occur within the first 3 months after quitting. Do not be discouraged if you start smoking again. Remember, most people try several times before finally quitting. You may have symptoms of withdrawal because your body is used to nicotine. You may crave cigarettes, be irritable, feel very hungry, cough often, get headaches, or have difficulty concentrating. The withdrawal symptoms are only temporary. They are strongest  when you first quit, but they will go away within 10-14 days. To reduce the chances of relapse, try to:  Avoid drinking alcohol. Drinking lowers your chances of successfully quitting.  Reduce the amount of caffeine you consume. Once you quit smoking, the amount of caffeine in your body increases and can give you symptoms, such as a rapid heartbeat, sweating, and anxiety.  Avoid smokers because they can make you want to smoke.  Do not let weight gain distract you. Many smokers will gain weight when they quit, usually less than 10 pounds. Eat a healthy diet and stay active. You can always lose the weight gained after you quit.  Find ways to improve your mood other than smoking. FOR MORE INFORMATION  www.smokefree.gov  Document Released: 12/21/2000 Document Revised: 05/13/2013 Document Reviewed: 04/07/2011 [facility] Patient Information 2015 [place], [place]. This information is not intended to replace advice given to you by your health care provider. Make sure you discuss any questions you have with your health care provider.    Bupropion sustained-release tablets (smoking cessation) What is this medicine? BUPROPION (byoo PROE pee on) is used to help people  quit smoking. This medicine may be used for other purposes; ask your health care provider or pharmacist if you have questions. COMMON BRAND NAME(S): Buproban, Zyban What should I tell my health care provider before I take this medicine? They need to know if you have any of these conditions: -an eating disorder, such as anorexia or bulimia -bipolar disorder or psychosis -diabetes or high blood sugar, treated with medication -glaucoma -head injury or brain tumor -heart disease, previous heart attack, or irregular heart beat -high blood pressure -kidney or liver disease -seizures -suicidal thoughts or a previous suicide attempt -Tourette's syndrome -weight loss -an unusual or allergic reaction to bupropion, other medicines, foods,  dyes, or preservatives -breast-feeding -pregnant or trying to become pregnant How should I use this medicine? Take this medicine by mouth with a glass of water. Follow the directions on the prescription label. You can take it with or without food. If it upsets your stomach, take it with food. Do not cut, crush or chew this medicine. Take your medicine at regular intervals. If you take this medicine more than once a day, take your second dose at least 8 hours after you take your first dose. To limit difficulty in sleeping, avoid taking this medicine at bedtime. Do not take your medicine more often than directed. Do not stop taking this medicine suddenly except upon the advice of your doctor. Stopping this medicine too quickly may cause serious side effects. A special MedGuide will be given to you by the pharmacist with each prescription and refill. Be sure to read this information carefully each time. Talk to your pediatrician regarding the use of this medicine in children. Special care may be needed. Overdosage: If you think you have taken too much of this medicine contact a poison control center or emergency room at once. NOTE: This medicine is only for you. Do not share this medicine with others. What if I miss a dose? If you miss a dose, skip the missed dose and take your next tablet at the regular time. There should be at least 8 hours between doses. Do not take double or extra doses. What may interact with this medicine? Do not take this medicine with any of the following medications: -linezolid -MAOIs like Azilect, Carbex, Eldepryl, Marplan, Nardil, and Parnate -methylene blue (injected into a vein) -other medicines that contain bupropion like Wellbutrin This medicine may also interact with the following medications: -alcohol -certain medicines for anxiety or sleep -certain medicines for blood pressure like metoprolol, propranolol -certain medicines for depression or psychotic  disturbances -certain medicines for HIV or AIDS like efavirenz, lopinavir, nelfinavir, ritonavir -certain medicines for irregular heart beat like propafenone, flecainide -certain medicines for Parkinson's disease like amantadine, levodopa -certain medicines for seizures like carbamazepine, phenytoin, phenobarbital -cimetidine -clopidogrel -cyclophosphamide -furazolidone -isoniazid -nicotine -orphenadrine -procarbazine -steroid medicines like prednisone or cortisone -stimulant medicines for attention disorders, weight loss, or to stay awake -tamoxifen -theophylline -thiotepa -ticlopidine -tramadol -warfarin This list may not describe all possible interactions. Give your health care provider a list of all the medicines, herbs, non-prescription drugs, or dietary supplements you use. Also tell them if you smoke, drink alcohol, or use illegal drugs. Some items may interact with your medicine. What should I watch for while using this medicine? Visit your doctor or health care professional for regular checks on your progress. This medicine should be used together with a patient support program. It is important to participate in a behavioral program, counseling, or other support program that is recommended  by your health care professional. Patients and their families should watch out for new or worsening thoughts of suicide or depression. Also watch out for sudden changes in feelings such as feeling anxious, agitated, panicky, irritable, hostile, aggressive, impulsive, severely restless, overly excited and hyperactive, or not being able to sleep. If this happens, especially at the beginning of treatment or after a change in dose, call your health care professional. Avoid alcoholic drinks while taking this medicine. Drinking excessive alcoholic beverages, using sleeping or anxiety medicines, or quickly stopping the use of these agents while taking this medicine may increase your risk for a  seizure. Do not drive or use heavy machinery until you know how this medicine affects you. This medicine can impair your ability to perform these tasks. Do not take this medicine close to bedtime. It may prevent you from sleeping. Your mouth may get dry. Chewing sugarless gum or sucking hard candy, and drinking plenty of water may help. Contact your doctor if the problem does not go away or is severe. Do not use nicotine patches or chewing gum without the advice of your doctor or health care professional while taking this medicine. You may need to have your blood pressure taken regularly if your doctor recommends that you use both nicotine and this medicine together. What side effects may I notice from receiving this medicine? Side effects that you should report to your doctor or health care professional as soon as possible: -allergic reactions like skin rash, itching or hives, swelling of the face, lips, or tongue -breathing problems -changes in vision -confusion -fast or irregular heartbeat -hallucinations -increased blood pressure -redness, blistering, peeling or loosening of the skin, including inside the mouth -seizures -suicidal thoughts or other mood changes -unusually weak or tired -vomiting Side effects that usually do not require medical attention (report to your doctor or health care professional if they continue or are bothersome): -change in sex drive or performance -constipation -headache -loss of appetite -nausea -tremors -weight loss This list may not describe all possible side effects. Call your doctor for medical advice about side effects. You may report side effects to FDA at 1-800-FDA-1088. Where should I keep my medicine? Keep out of the reach of children. Store at room temperature between 20 and 25 degrees C (68 and 77 degrees F). Protect from light. Keep container tightly closed. Throw away any unused medicine after the expiration date. NOTE: This sheet is a  summary. It may not cover all possible information. If you have questions about this medicine, talk to your doctor, pharmacist, or health care provider.  2015, [employer]vier/[employer] Standard. (2012-08-24 10:55:10)

## 2014-02-21 ENCOUNTER — Telehealth: Payer: Self-pay | Admitting: Urgent Care

## 2014-02-21 DIAGNOSIS — E785 Hyperlipidemia, unspecified: Secondary | ICD-10-CM

## 2014-02-21 DIAGNOSIS — R911 Solitary pulmonary nodule: Secondary | ICD-10-CM

## 2014-02-21 DIAGNOSIS — F17200 Nicotine dependence, unspecified, uncomplicated: Secondary | ICD-10-CM | POA: Insufficient documentation

## 2014-02-21 MED ORDER — PRAVASTATIN SODIUM 40 MG PO TABS: 40.0000 mg | ORAL_TABLET | Freq: Every day | ORAL | Status: DC

## 2014-02-21 NOTE — Telephone Encounter (Signed)
Please schedule patient for a chest CT. Radiologist over-read reported that there was a right sided pulmonary nodule and due to his history of smoking, we would like to investigate this further. I would like for patient to continue Azithromycin course and I plan on following up with him after CT results. Please provide patient with instructions on where to go for his chest CT.  Lastly, I will be sending a rx for pravastatin to help lower his cholesterol. He has >18% increased risk of undergoing cardiovascular event such as heart attack or stroke. Lowering his cholesterol will decrease his risk of having a major event.  Raymond BambergMario [name], PA-C [facility] [phone] 02/21/2014  11:54 AM

## 2014-02-21 NOTE — Telephone Encounter (Signed)
Patient states that he received a call from [facility], [place]. Patient stated something about scheduling an appointment to come back and see him (I tried to help with this part but did not see any appointments on schedule until April. Gave patient his walk in hours instead). Patient also mentioned something about more imaging. Does he have a referral? Please call patient at [phone].  Thanks, Costco WholesaleJasmine

## 2014-02-21 NOTE — Telephone Encounter (Signed)
Spoke with pt, advised message from Mani. Pt understood. 

## 2014-02-21 NOTE — Telephone Encounter (Signed)
Mani I don't see any referral. Please advise.

## 2014-02-27 ENCOUNTER — Telehealth: Payer: Self-pay

## 2014-02-27 NOTE — Telephone Encounter (Signed)
Pt LM on lab VM regarding labs. LMOM to CB

## 2014-02-27 NOTE — Telephone Encounter (Signed)
Patient came by office and I went over lab results and gave him a copy

## 2014-03-14 ENCOUNTER — Inpatient Hospital Stay: Admission: RE | Admit: 2014-03-14 | Payer: Self-pay | Source: Ambulatory Visit

## 2014-09-24 ENCOUNTER — Ambulatory Visit (INDEPENDENT_AMBULATORY_CARE_PROVIDER_SITE_OTHER): Payer: PRIVATE HEALTH INSURANCE

## 2014-09-24 ENCOUNTER — Ambulatory Visit (INDEPENDENT_AMBULATORY_CARE_PROVIDER_SITE_OTHER): Payer: PRIVATE HEALTH INSURANCE | Admitting: Emergency Medicine

## 2014-09-24 VITALS — BP 116/72 | HR 63 | Temp 97.7°F | Resp 18 | Ht 68.5 in | Wt 205.0 lb

## 2014-09-24 DIAGNOSIS — R631 Polydipsia: Secondary | ICD-10-CM

## 2014-09-24 DIAGNOSIS — F17200 Nicotine dependence, unspecified, uncomplicated: Secondary | ICD-10-CM

## 2014-09-24 DIAGNOSIS — Z720 Tobacco use: Secondary | ICD-10-CM

## 2014-09-24 DIAGNOSIS — E785 Hyperlipidemia, unspecified: Secondary | ICD-10-CM

## 2014-09-24 LAB — GLUCOSE, POCT (MANUAL RESULT ENTRY): POC Glucose: 94 mg/dl (ref 70–99)

## 2014-09-24 LAB — POCT CBC
Granulocyte percent: 70.3 %G (ref 37–80)
HCT, POC: 42.4 % — AB (ref 43.5–53.7)
Hemoglobin: 14 g/dL — AB (ref 14.1–18.1)
LYMPH, POC: 2.7 (ref 0.6–3.4)
MCH, POC: 29 pg (ref 27–31.2)
MCHC: 33 g/dL (ref 31.8–35.4)
MCV: 87.8 fL (ref 80–97)
MID (CBC): 0.4 (ref 0–0.9)
MPV: 8.5 fL (ref 0–99.8)
POC Granulocyte: 7.4 — AB (ref 2–6.9)
POC LYMPH PERCENT: 25.8 %L (ref 10–50)
POC MID %: 3.9 %M (ref 0–12)
Platelet Count, POC: 198 10*3/uL (ref 142–424)
RBC: 4.83 M/uL (ref 4.69–6.13)
RDW, POC: 14.6 %
WBC: 10.5 10*3/uL — AB (ref 4.6–10.2)

## 2014-09-24 LAB — COMPLETE METABOLIC PANEL WITH GFR
ALT: 21 U/L (ref 9–46)
AST: 19 U/L (ref 10–35)
Albumin: 4.3 g/dL (ref 3.6–5.1)
Alkaline Phosphatase: 51 U/L (ref 40–115)
BILIRUBIN TOTAL: 0.7 mg/dL (ref 0.2–1.2)
BUN: 14 mg/dL (ref 7–25)
CHLORIDE: 104 mmol/L (ref 98–110)
CO2: 26 mmol/L (ref 20–31)
CREATININE: 0.69 mg/dL — AB (ref 0.70–1.33)
Calcium: 9.3 mg/dL (ref 8.6–10.3)
GFR, Est African American: >89 mL/min (ref >=60)
GFR, Est Non African American: >89 mL/min (ref >=60)
GLUCOSE: 95 mg/dL (ref 65–99)
Potassium: 4.6 mmol/L (ref 3.5–5.3)
SODIUM: 137 mmol/L (ref 135–146)
TOTAL PROTEIN: 7.1 g/dL (ref 6.1–8.1)

## 2014-09-24 LAB — POCT URINALYSIS DIPSTICK
Bilirubin, UA: NEGATIVE
GLUCOSE UA: NEGATIVE
Ketones, UA: NEGATIVE
Leukocytes, UA: NEGATIVE
NITRITE UA: NEGATIVE
PROTEIN UA: NEGATIVE
RBC UA: NEGATIVE
SPEC GRAV UA: 1.015
UROBILINOGEN UA: 0.2
pH, UA: 6

## 2014-09-24 LAB — LIPID PANEL
Cholesterol: 186 mg/dL (ref 125–200)
HDL: 29 mg/dL — ABNORMAL LOW (ref >=40)
LDL CALC: 108 mg/dL (ref <130)
TRIGLYCERIDES: 247 mg/dL — AB (ref <150)
Total CHOL/HDL Ratio: 6.4 Ratio — ABNORMAL HIGH (ref <=5.0)
VLDL: 49 mg/dL — ABNORMAL HIGH (ref <30)

## 2014-09-24 LAB — POCT UA - MICROSCOPIC ONLY
BACTERIA, U MICROSCOPIC: NEGATIVE
CASTS, UR, LPF, POC: NEGATIVE
Crystals, Ur, HPF, POC: NEGATIVE
Epithelial cells, urine per micros: NEGATIVE
Mucus, UA: NEGATIVE
RBC, URINE, MICROSCOPIC: NEGATIVE
WBC, Ur, HPF, POC: NEGATIVE
Yeast, UA: NEGATIVE

## 2014-09-24 LAB — POCT GLYCOSYLATED HEMOGLOBIN (HGB A1C): Hemoglobin A1C: 5.8

## 2014-09-24 LAB — TSH: TSH: 0.935 u[IU]/mL (ref 0.350–4.500)

## 2014-09-24 NOTE — Progress Notes (Addendum)
Subjective:  This chart was scribed for Lesle Chris MD by Veverly Fells, at [facility].  This patient was seen in room 3 and the patient's care was started at 8:17 AM.    Patient ID: Raymond Velez, male    DOB: 31-Jul-1955, 59 y.o.   MRN: [id number] Chief Complaint  Patient presents with  . dm check    thristy since feb   . Fatigue    HPI  HPI Comments: Raymond Velez is a 59 y.o. male who presents to the [facility] complaining of constantly being thirsty with "salty lips" (since February) .  Patient states that he drinks 6-7 bottles a day and still feels thirsty. He was seen here last in February and was given medication for cholesterol (which he is not currently compliant with).  He states that since, he has a loss of energy and would like his sugar checked.  His weight has not changed since his last visit and denies any change in appetite.  Patient has been working regularly ([employer]).  He denies any change in frequency.  Patient smokes 1 pack a day.    Patient Active Problem List   Diagnosis Date Noted  . Tobacco use disorder 02/21/2014  . Pulmonary nodule, right 02/21/2014  . Hyperlipidemia 02/21/2014   Past Medical History  Diagnosis Date  . Hyperlipidemia    History reviewed. No pertinent past surgical history. No Known Allergies Prior to Admission medications   Medication Sig Start Date End Date Taking? Authorizing Provider  azithromycin (ZITHROMAX) 500 MG tablet Take 1 tablet (500 mg total) by mouth daily. Patient not taking: Reported on 09/24/2014 02/20/14   Wallis Bamberg, PA-C  pravastatin (PRAVACHOL) 40 MG tablet Take 1 tablet (40 mg total) by mouth daily. Patient not taking: Reported on 09/24/2014 02/21/14   Wallis Bamberg, PA-C   Social History   Social History  . Marital Status: Married    Spouse Name: N/A  . Number of Children: N/A  . Years of Education: N/A   Occupational History  . Not on file.   Social  History Main Topics  . Smoking status: Current Every Day Smoker    Types: Cigarettes  . Smokeless tobacco: Not on file  . Alcohol Use: 0.0 oz/week    0 Standard drinks or equivalent per week  . Drug Use: No  . Sexual Activity: Not on file   Other Topics Concern  . Not on file   Social History Narrative        Review of Systems  Constitutional: Positive for fatigue. Negative for fever, chills, appetite change and unexpected weight change.  Gastrointestinal: Negative for nausea and vomiting.  Genitourinary: Negative for frequency.  Musculoskeletal: Negative for neck pain and neck stiffness.       Objective:   Physical Exam   CONSTITUTIONAL: Well developed/well nourished HEAD: Normocephalic/atraumatic EYES: EOMI/PERRL ENMT: Mucous membranes moist NECK: supple no meningeal signs SPINE/BACK:entire spine nontender CV: S1/S2 noted, no murmurs/rubs/gallops noted LUNGS: Lungs are clear to auscultation bilaterally, no apparent distress ABDOMEN: soft, nontender, no rebound or guarding, bowel sounds noted throughout abdomen GU:no cva tenderness NEURO: Pt is awake/alert/appropriate, moves all extremitiesx4.  No facial droop.   EXTREMITIES: pulses normal/equal, full ROM SKIN: warm, color normal PSYCH: no abnormalities of mood noted, alert and oriented to situation  [facility] reading (PRIMARY) by  Dr. Cleta Alberts  NAD calc. gran rt. base  Filed Vitals:   09/24/14 0813  BP: 116/72  Pulse: 63  Temp: 97.7 F (36.5 C)  TempSrc: Oral  Resp: 18  Height: 5' 8.5" (1.74 m)  Weight: 205 lb (92.987 kg)  SpO2: 98%   Results for orders placed or performed in visit on 09/24/14  POCT CBC  Result Value Ref Range   WBC 10.5 (A) 4.6 - 10.2 K/uL   Lymph, poc 2.7 0.6 - 3.4   POC LYMPH PERCENT 25.8 10 - 50 %L   MID (cbc) 0.4 0 - 0.9   POC MID % 3.9 0 - 12 %M   POC Granulocyte 7.4 (A) 2 - 6.9   Granulocyte percent 70.3 37 - 80 %G   RBC 4.83 4.69 - 6.13 M/uL   Hemoglobin 14.0 (A) 14.1 - 18.1 g/dL    HCT, POC 16.1 (A) 09.6 - 53.7 %   MCV 87.8 80 - 97 fL   MCH, POC 29.0 27 - 31.2 pg   MCHC 33.0 31.8 - 35.4 g/dL   RDW, POC 04.5 %   Platelet Count, POC 198.0 142 - 424 K/uL   MPV 8.5 0 - 99.8 fL  POCT glucose (manual entry)  Result Value Ref Range   POC Glucose 94 70 - 99 mg/dl  POCT glycosylated hemoglobin (Hb A1C)  Result Value Ref Range   Hemoglobin A1C 5.8   POCT UA - Microscopic Only  Result Value Ref Range   WBC, Ur, HPF, POC Negative    RBC, urine, microscopic Negative    Bacteria, U Microscopic Negative    Mucus, UA Negative    Epithelial cells, urine per micros Negative    Crystals, Ur, HPF, POC Negative    Casts, Ur, LPF, POC Negative    Yeast, UA Negative   POCT urinalysis dipstick  Result Value Ref Range   Color, UA Amber    Clarity, UA Clear    Glucose, UA Negative    Bilirubin, UA Negative    Ketones, UA Negative    Spec Grav, UA 1.015    Blood, UA Negative    pH, UA 6.0    Protein, UA Negative    Urobilinogen, UA 0.2    Nitrite, UA Negative    Leukocytes, UA Negative Negative       Assessment & Plan:  Labs look good. No signs of diabetes on this testing his physical exam was normal. He was advised to quit smoking. He does have a calcified granuloma in the right base.I personally performed the services described in this documentation, which was scribed in my presence. The recorded information has been reviewed and is accurate.

## 2014-09-26 ENCOUNTER — Encounter: Payer: Self-pay | Admitting: Family Medicine

## 2015-09-10 ENCOUNTER — Other Ambulatory Visit: Payer: Self-pay | Admitting: Family Medicine

## 2015-09-10 ENCOUNTER — Ambulatory Visit
Admission: RE | Admit: 2015-09-10 | Discharge: 2015-09-10 | Disposition: A | Discharge status: Home / Self Care | Payer: PRIVATE HEALTH INSURANCE | Source: Ambulatory Visit | Attending: Family Medicine | Admitting: Family Medicine

## 2015-09-10 DIAGNOSIS — R05 Cough: Secondary | ICD-10-CM

## 2015-09-10 DIAGNOSIS — Z87891 Personal history of nicotine dependence: Secondary | ICD-10-CM

## 2015-09-10 DIAGNOSIS — R054 Cough syncope: Secondary | ICD-10-CM

## 2018-09-28 ENCOUNTER — Encounter (HOSPITAL_COMMUNITY): Payer: Self-pay

## 2018-09-28 ENCOUNTER — Other Ambulatory Visit (HOSPITAL_COMMUNITY): Payer: Self-pay | Admitting: *Deleted

## 2018-09-28 NOTE — Patient Instructions (Addendum)
DUE TO COVID-19 ONLY ONE VISITOR IS ALLOWED TO COME WITH YOU AND STAY IN THE WAITING ROOM ONLY DURING PRE OP AND PROCEDURE DAY OF SURGERY. THE 1 VISITOR MAY VISIT WITH YOU AFTER SURGERY IN YOUR PRIVATE ROOM DURING VISITING HOURS ONLY!  YOU NEED TO HAVE A COVID 19 TEST ON 10-05-2018 at   0905 am , THIS TEST MUST BE DONE BEFORE SURGERY, COME  Raymond Velez, [place] , [address].  ([place]) ONCE YOUR COVID TEST IS COMPLETED, PLEASE BEGIN THE QUARANTINE INSTRUCTIONS AS OUTLINED IN YOUR HANDOUT.                Raymond Velez    Your procedure is scheduled on: 10-09-2018 Tuesday    Report to [facility] Main  Entrance              Report to admitting at 610  AM     Call this number if you have problems the morning of surgery [phone]    Remember: [place], NO [place].              Remember: NO SMOKING 24 HOURS BEFORE SURGERY!    NO SOLID FOOD AFTER MIDNIGHT THE NIGHT PRIOR TO SURGERY. NOTHING BY MOUTH EXCEPT CLEAR LIQUIDS UNTIL 540 am .              PLEASE FINISH ENSURE DRINK PER SURGEON ORDER  WHICH NEEDS TO BE COMPLETED AT 540 am .    CLEAR LIQUID DIET   Foods Allowed                                                                     Foods Excluded  Coffee and tea, regular and decaf                             liquids that you cannot  Plain Jell-O any favor except red or purple                                           see through such as: Fruit ices (not with fruit pulp)                                     milk, soups, orange juice  Iced Popsicles                                    All solid food Carbonated beverages, regular and diet                                    Cranberry, grape and apple juices Sports drinks like Gatorade Lightly seasoned clear broth or consume(fat free) Sugar, honey syrup  Sample Menu Breakfast  Lunch                                      Supper Cranberry juice                    Beef broth                            Chicken broth Jell-O                                     Grape juice                           Apple juice Coffee or tea                        Jell-O                                      Popsicle                                                Coffee or tea                        Coffee or tea  _____________________________________________________________________    Take these medicines the morning of surgery with A SIP OF WATER: none                                 You may not have any metal on your body including hair pins and              piercings  Do not wear jewelry, make-up, lotions, powders or perfumes, deodorant                         Men may shave face and neck.   Do not bring valuables to the hospital. [facility] IS NOT             RESPONSIBLE   FOR VALUABLES.  Contacts, dentures or bridgework may not be worn into surgery.  Leave suitcase in the car. After surgery it may be brought to your room.                  Please read over the following fact sheets you were given: _____________________________________________________________________             [facility] - Preparing for Surgery Before surgery, you can play an important role.  Because skin is not sterile, your skin needs to be as free of germs as possible.  You can reduce the number of germs on your skin by washing with CHG (chlorahexidine gluconate) soap before surgery.  CHG is an antiseptic cleaner which kills germs and bonds with the skin to continue killing germs even after washing. Please DO NOT use if you have an allergy to CHG or antibacterial soaps.  If your skin becomes reddened/irritated stop using the CHG and inform your nurse when you arrive at Short Stay. Do not shave (including legs and underarms) for at least 48 hours prior to the first CHG shower.  You may shave your face/neck. Please follow  these instructions carefully:  1.  Shower with CHG Soap the night before surgery and the  morning of Surgery.  2.  If you choose to wash your hair, wash your hair first as usual with your  normal  shampoo.  3.  After you shampoo, rinse your hair and body thoroughly to remove the  shampoo.                           4.  Use CHG as you would any other liquid soap.  You can apply chg directly  to the skin and wash                       Gently with a scrungie or clean washcloth.  5.  Apply the CHG Soap to your body ONLY FROM THE NECK DOWN.   Do not use on face/ open                           Wound or open sores. Avoid contact with eyes, ears mouth and genitals (private parts).                       Wash face,  Genitals (private parts) with your normal soap.             6.  Wash thoroughly, paying special attention to the area where your surgery  will be performed.  7.  Thoroughly rinse your body with warm water from the neck down.  8.  DO NOT shower/wash with your normal soap after using and rinsing off  the CHG Soap.                9.  Pat yourself dry with a clean towel.            10.  Wear clean pajamas.            11.  Place clean sheets on your bed the night of your first shower and do not  sleep with pets. Day of Surgery : Do not apply any lotions/deodorants the morning of surgery.  Please wear clean clothes to the hospital/surgery center.  FAILURE TO FOLLOW THESE INSTRUCTIONS MAY RESULT IN THE CANCELLATION OF YOUR SURGERY PATIENT SIGNATURE_________________________________  NURSE SIGNATURE__________________________________  ________________________________________________________________________   Raymond Velez  An incentive spirometer is a tool that can help keep your lungs clear and active. This tool measures how well you are filling your lungs with each breath. Taking long deep breaths may help reverse or decrease the chance of developing breathing (pulmonary) problems  (especially infection) following:  A long period of time when you are unable to move or be active. BEFORE THE PROCEDURE   If the spirometer includes an indicator to show your best effort, your nurse or respiratory therapist will set it to a desired goal.  If possible, sit up straight or lean slightly forward. Try not to slouch.  Hold the incentive spirometer in an upright position. INSTRUCTIONS FOR USE  1. Sit on the edge of your bed if possible, or sit up as far as you can in bed or on  a chair. 2. Hold the incentive spirometer in an upright position. 3. Breathe out normally. 4. Place the mouthpiece in your mouth and seal your lips tightly around it. 5. Breathe in slowly and as deeply as possible, raising the piston or the ball toward the top of the column. 6. Hold your breath for 3-5 seconds or for as long as possible. Allow the piston or ball to fall to the bottom of the column. 7. Remove the mouthpiece from your mouth and breathe out normally. 8. Rest for a few seconds and repeat Steps 1 through 7 at least 10 times every 1-2 hours when you are awake. Take your time and take a few normal breaths between deep breaths. 9. The spirometer may include an indicator to show your best effort. Use the indicator as a goal to work toward during each repetition. 10. After each set of 10 deep breaths, practice coughing to be sure your lungs are clear. If you have an incision (the cut made at the time of surgery), support your incision when coughing by placing a pillow or rolled up towels firmly against it. Once you are able to get out of bed, walk around indoors and cough well. You may stop using the incentive spirometer when instructed by your caregiver.  RISKS AND COMPLICATIONS  Take your time so you do not get dizzy or light-headed.  If you are in pain, you may need to take or ask for pain medication before doing incentive spirometry. It is harder to take a deep breath if you are having  pain. AFTER USE  Rest and breathe slowly and easily.  It can be helpful to keep track of a log of your progress. Your caregiver can provide you with a simple table to help with this. If you are using the spirometer at home, follow these instructions: SEEK MEDICAL CARE IF:   You are having difficultly using the spirometer.  You have trouble using the spirometer as often as instructed.  Your pain medication is not giving enough relief while using the spirometer.  You develop fever of 100.5 F (38.1 C) or higher. SEEK IMMEDIATE MEDICAL CARE IF:   You cough up bloody sputum that had not been present before.  You develop fever of 102 F (38.9 C) or greater.  You develop worsening pain at or near the incision site. MAKE SURE YOU:   Understand these instructions.  Will watch your condition.  Will get help right away if you are not doing well or get worse. Document Released: 05/09/2006 Document Revised: 03/21/2011 Document Reviewed: 07/10/2006 ExitCare Patient Information 2014 [employer], [place].   ________________________________________________________________________  WHAT IS A BLOOD TRANSFUSION? Blood Transfusion Information  A transfusion is the replacement of blood or some of its parts. Blood is made up of multiple cells which provide different functions.  Red blood cells carry oxygen and are used for blood loss replacement.  White blood cells fight against infection.  Platelets control bleeding.  Plasma helps clot blood.  Other blood products are available for specialized needs, such as hemophilia or other clotting disorders. BEFORE THE TRANSFUSION  Who gives blood for transfusions?   Healthy volunteers who are fully evaluated to make sure their blood is safe. This is blood bank blood. Transfusion therapy is the safest it has ever been in the practice of medicine. Before blood is taken from a donor, a complete history is taken to make sure that person has no history  of diseases nor engages in risky social behavior (  examples are intravenous drug use or sexual activity with multiple partners). The donor's travel history is screened to minimize risk of transmitting infections, such as malaria. The donated blood is tested for signs of infectious diseases, such as HIV and hepatitis. The blood is then tested to be sure it is compatible with you in order to minimize the chance of a transfusion reaction. If you or a relative donates blood, this is often done in anticipation of surgery and is not appropriate for emergency situations. It takes many days to process the donated blood. RISKS AND COMPLICATIONS Although transfusion therapy is very safe and saves many lives, the main dangers of transfusion include:   Getting an infectious disease.  Developing a transfusion reaction. This is an allergic reaction to something in the blood you were given. Every precaution is taken to prevent this. The decision to have a blood transfusion has been considered carefully by your caregiver before blood is given. Blood is not given unless the benefits outweigh the risks. AFTER THE TRANSFUSION  Right after receiving a blood transfusion, you will usually feel much better and more energetic. This is especially true if your red blood cells have gotten low (anemic). The transfusion raises the level of the red blood cells which carry oxygen, and this usually causes an energy increase.  The nurse administering the transfusion will monitor you carefully for complications. HOME CARE INSTRUCTIONS  No special instructions are needed after a transfusion. You may find your energy is better. Speak with your caregiver about any limitations on activity for underlying diseases you may have. SEEK MEDICAL CARE IF:   Your condition is not improving after your transfusion.  You develop redness or irritation at the intravenous (IV) site. SEEK IMMEDIATE MEDICAL CARE IF:  Any of the following symptoms  occur over the next 12 hours:  Shaking chills.  You have a temperature by mouth above 102 F (38.9 C), not controlled by medicine.  Chest, back, or muscle pain.  People around you feel you are not acting correctly or are confused.  Shortness of breath or difficulty breathing.  Dizziness and fainting.  You get a rash or develop hives.  You have a decrease in urine output.  Your urine turns a dark color or changes to pink, red, or brown. Any of the following symptoms occur over the next 10 days:  You have a temperature by mouth above 102 F (38.9 C), not controlled by medicine.  Shortness of breath.  Weakness after normal activity.  The white part of the eye turns yellow (jaundice).  You have a decrease in the amount of urine or are urinating less often.  Your urine turns a dark color or changes to pink, red, or brown. Document Released: 12/25/1999 Document Revised: 03/21/2011 Document Reviewed: 08/13/2007 [facility] Patient Information 2014 [place], [place].  _______________________________________________________________________

## 2018-09-28 NOTE — H&P (Signed)
TOTAL HIP ADMISSION H&P  Patient is admitted for left total hip arthroplasty, anterior approach.  Subjective:  Chief Complaint:   Left hip primary OA / pain  HPI: Raymond Velez, 63 y.o. male, has a history of pain and functional disability in the left hip(s) due to arthritis and patient has failed non-surgical conservative treatments for greater than 12 weeks to include NSAID's and/or analgesics, corticosteriod injections and activity modification.  Onset of symptoms was gradual starting 5-6 years ago with gradually worsening course since that time.The patient noted no past surgery on the left hip(s).  Patient currently rates pain in the left hip at 9 out of 10 with activity. Patient has night pain, worsening of pain with activity and weight bearing, trendelenberg gait, pain that interfers with activities of daily living and pain with passive range of motion. Patient has evidence of periarticular osteophytes and joint space narrowing by imaging studies. This condition presents safety issues increasing the risk of falls.   There is no current active infection.  Risks, benefits and expectations were discussed with the patient.  Risks including but not limited to the risk of anesthesia, blood clots, nerve damage, blood vessel damage, failure of the prosthesis, infection and up to and including death.  Patient understand the risks, benefits and expectations and wishes to proceed with surgery.   PCP: Patient, No Pcp Per  D/C Plans:       Home   Post-op Meds:       No Rx given   Tranexamic Acid:      To be given - IV   Decadron:      Is to be given  FYI:      ASA  [place]  DME:   Pt equipment Rxs sent for - RW & 3-n-1  PT:   HEP  Pharmacy: [facility] [address]    Patient Active Problem List   Diagnosis Date Noted  . Tobacco use disorder 02/21/2014  . Pulmonary nodule, right 02/21/2014  . Hyperlipidemia 02/21/2014   Past Medical History:  Diagnosis Date  . Hyperlipidemia     No  past surgical history on file.  No current facility-administered medications for this encounter.    Current Outpatient Medications  Medication Sig Dispense Refill Last Dose  . ibuprofen (ADVIL) 200 MG tablet Take 400-800 mg by mouth every 8 (eight) hours as needed (hip pain.).     Marland Kitchen. PAIN RELIEF 500 MG tablet Take 500-1,000 mg by mouth every 6 (six) hours as needed (for hip pain.).     Marland Kitchen. SENNOSIDES PO Take 8.5 mg by mouth 3 (three) times daily with meals. Swiss Kriss Herbal Laxative Tablets  Finely Powdered Sun-Dried Leaves of SennaInactive Ingredients: Finely Powdered Strawberry Leaves, Peach Leaves, Anise Seed, Caraway Seed, Hibiscus and Calendula Flowers (for their flavoring), Carminative Principles      No Known Allergies   Social History   Tobacco Use  . Smoking status: Current Every Day Smoker    Types: Cigarettes  Substance Use Topics  . Alcohol use: Yes    Alcohol/week: 0.0 standard drinks       Review of Systems  Constitutional: Negative.   HENT: Negative.   Eyes: Negative.   Respiratory: Negative.   Cardiovascular: Negative.   Gastrointestinal: Positive for constipation.  Genitourinary: Negative.   Musculoskeletal: Positive for joint pain.  Skin: Negative.   Neurological: Negative.   Endo/Heme/Allergies: Negative.   Psychiatric/Behavioral: Negative.     Objective:  Physical Exam  Constitutional: He is oriented  to person, place, and time. He appears well-developed.  HENT:  Head: Normocephalic.  Eyes: Pupils are equal, round, and reactive to light.  Neck: Neck supple. No JVD present. No tracheal deviation present. No thyromegaly present.  Cardiovascular: Normal rate, regular rhythm and intact distal pulses.  Respiratory: Effort normal and breath sounds normal. No respiratory distress. He has no wheezes.  GI: Soft. There is no abdominal tenderness. There is no guarding.  Musculoskeletal:     Left hip: He exhibits decreased range of motion, decreased strength,  tenderness and bony tenderness. He exhibits no swelling, no deformity and no laceration.  Lymphadenopathy:    He has no cervical adenopathy.  Neurological: He is alert and oriented to person, place, and time.  Skin: Skin is warm and dry.  Psychiatric: He has a normal mood and affect.      Labs:  Estimated body mass index is 30.72 kg/m as calculated from the following:   Height as of 09/24/14: 5' 8.5" (1.74 m).   Weight as of 09/24/14: 93 kg.   Imaging Review Plain radiographs demonstrate severe degenerative joint disease of the left hip. The bone quality appears to be good for age and reported activity level.      Assessment/Plan:  End stage arthritis, left hip  The patient history, physical examination, clinical judgement of the provider and imaging studies are consistent with end stage degenerative joint disease of the left hip and total hip arthroplasty is deemed medically necessary. The treatment options including medical management, injection therapy, arthroscopy and arthroplasty were discussed at length. The risks and benefits of total hip arthroplasty were presented and reviewed. The risks due to aseptic loosening, infection, stiffness, dislocation/subluxation,  thromboembolic complications and other imponderables were discussed.  The patient acknowledged the explanation, agreed to proceed with the plan and consent was signed. Patient is being admitted for inpatient treatment for surgery, pain control, PT, OT, prophylactic antibiotics, VTE prophylaxis, progressive ambulation and ADL's and discharge planning.The patient is planning to be discharged home.     West Pugh [place]   PA-C  09/28/2018, 9:20 AM

## 2018-10-01 ENCOUNTER — Encounter (HOSPITAL_COMMUNITY)
Admission: RE | Admit: 2018-10-01 | Discharge: 2018-10-01 | Disposition: A | Discharge status: Home / Self Care | Payer: PRIVATE HEALTH INSURANCE | Source: Ambulatory Visit | Attending: Orthopedic Surgery | Admitting: Orthopedic Surgery

## 2018-10-01 ENCOUNTER — Other Ambulatory Visit: Payer: Self-pay

## 2018-10-01 ENCOUNTER — Encounter (HOSPITAL_COMMUNITY): Payer: Self-pay

## 2018-10-01 DIAGNOSIS — M1612 Unilateral primary osteoarthritis, left hip: Secondary | ICD-10-CM | POA: Diagnosis not present

## 2018-10-01 DIAGNOSIS — M25552 Pain in left hip: Secondary | ICD-10-CM | POA: Insufficient documentation

## 2018-10-01 DIAGNOSIS — Z01812 Encounter for preprocedural laboratory examination: Secondary | ICD-10-CM | POA: Insufficient documentation

## 2018-10-01 HISTORY — DX: Unspecified osteoarthritis, unspecified site: M19.90

## 2018-10-01 LAB — CBC
HCT: 46.5 % (ref 39.0–52.0)
Hemoglobin: 15.2 g/dL (ref 13.0–17.0)
MCH: 30.2 pg (ref 26.0–34.0)
MCHC: 32.7 g/dL (ref 30.0–36.0)
MCV: 92.3 fL (ref 80.0–100.0)
Platelets: 210 10*3/uL (ref 150–400)
RBC: 5.04 MIL/uL (ref 4.22–5.81)
RDW: 13.3 % (ref 11.5–15.5)
WBC: 11.5 10*3/uL — ABNORMAL HIGH (ref 4.0–10.5)
nRBC: 0 % (ref 0.0–0.2)

## 2018-10-01 LAB — SURGICAL PCR SCREEN
MRSA, PCR: POSITIVE — AB
Staphylococcus aureus: POSITIVE — AB

## 2018-10-01 LAB — ABO/RH: ABO/RH(D): B NEG

## 2018-10-01 NOTE — Progress Notes (Signed)
PCP - [place] Cardiologist - 2-3 years ago [facility] physician for fluid in mouth-negative results  Chest x-ray -years ago  EKG - years ago Stress Test - years ago ECHO - years ago Cardiac Cath -N/A   Sleep Study -N/A  CPAP -   Fasting Blood Sugar - N/A Checks Blood Sugar _____ times a day  Blood Thinner Instructions: Aspirin Instructions:N/A Last Dose:  Anesthesia review:  Chart given to Ward Givens to review  Patient denies shortness of breath, fever, cough and chest pain at PAT appointment  Today at pre-op visit ,patient states has at times a smoker's cough  Patient verbalized understanding of instructions that were given to them at the PAT appointment. Patient was also instructed that they will need to review over the PAT instructions again at home before surgery.

## 2018-10-02 ENCOUNTER — Other Ambulatory Visit (HOSPITAL_COMMUNITY): Payer: PRIVATE HEALTH INSURANCE

## 2018-10-03 ENCOUNTER — Encounter: Payer: Self-pay | Admitting: Cardiology

## 2018-10-03 ENCOUNTER — Ambulatory Visit: Payer: PRIVATE HEALTH INSURANCE | Admitting: Cardiology

## 2018-10-03 ENCOUNTER — Other Ambulatory Visit: Payer: Self-pay

## 2018-10-03 VITALS — BP 139/90 | HR 77 | Ht 66.0 in | Wt 204.2 lb

## 2018-10-03 DIAGNOSIS — Z01810 Encounter for preprocedural cardiovascular examination: Secondary | ICD-10-CM | POA: Diagnosis not present

## 2018-10-03 DIAGNOSIS — I4510 Unspecified right bundle-branch block: Secondary | ICD-10-CM | POA: Diagnosis not present

## 2018-10-03 DIAGNOSIS — F17200 Nicotine dependence, unspecified, uncomplicated: Secondary | ICD-10-CM

## 2018-10-03 NOTE — Progress Notes (Signed)
Primary Physician/Referring:  Ileana Ladd, MD  Patient ID: Raymond Velez, male    DOB: 10/05/1955, 63 y.o.   MRN: [id number]  Chief Complaint  Patient presents with   RBBB    Stat   HPI:    Raymond Velez  is a 63 y.o. Caucasian male with hyperlipidemia, tobacco use disorder, hyperglycemia, who I had last seen in 2017 when he presented with vasovagal syncope.  He had a routine treadmill stress test at that time which was negative for myocardial ischemia and good exercise tolerance and echocardiogram was essentially normal.  He is now referred back for preoperative cardiac stratification, he scheduled for left hip arthroplasty by Dr. Durene Romans.  Smokes about 2 packs of cigarettes a day.  He has 50 to 60 pack year history of smoking.  Until a year ago states that he was fairly active and walking on a daily basis but over the past 1 year due to severe left hip pain he has essentially been markedly sedentary, he is a Naval architect by profession.  Denies chest pain or shortness of breath.  Does have chronic cough.  Denies symptoms of claudication involving the calves.  Past Medical History:  Diagnosis Date   Arthritis    Hyperlipidemia    RBBB    Past Surgical History:  Procedure Laterality Date   COLONOSCOPY      x 3   Social History   Socioeconomic History   Marital status: Married    Spouse name: Not on file   Number of children: 2   Years of education: Not on file   Highest education level: Not on file  Occupational History   Not on file  Social Needs   Financial resource strain: Not on file   Food insecurity    Worry: Not on file    Inability: Not on file   Transportation needs    Medical: Not on file    Non-medical: Not on file  Tobacco Use   Smoking status: Current Every Day Smoker    Packs/day: 1.50    Types: Cigarettes   Smokeless tobacco: Never Used  Substance and Sexual Activity   Alcohol use: Yes    Alcohol/week: 0.0 standard drinks     Comment: beer occasionally   Drug use: No   Sexual activity: Not on file  Lifestyle   Physical activity    Days per week: Not on file    Minutes per session: Not on file   Stress: Not on file  Relationships   Social connections    Talks on phone: Not on file    Gets together: Not on file    Attends religious service: Not on file    Active member of club or organization: Not on file    Attends meetings of clubs or organizations: Not on file    Relationship status: Not on file   Intimate partner violence    Fear of current or ex partner: Not on file    Emotionally abused: Not on file    Physically abused: Not on file    Forced sexual activity: Not on file  Other Topics Concern   Not on file  Social History Narrative   Not on file   ROS  Review of Systems  Constitution: Negative for chills, decreased appetite, malaise/fatigue and weight gain.  Cardiovascular: Negative for dyspnea on exertion, leg swelling and syncope.  Respiratory: Positive for cough (chronic).   Endocrine: Negative for cold intolerance.  Hematologic/Lymphatic: Does  not bruise/bleed easily.  Musculoskeletal: Positive for joint pain (left hip). Negative for joint swelling.  Gastrointestinal: Negative for abdominal pain, anorexia, change in bowel habit, hematochezia and melena.  Neurological: Negative for headaches and light-headedness.  Psychiatric/Behavioral: Negative for depression and substance abuse.  All other systems reviewed and are negative.  Objective   Vitals with BMI 10/03/2018 10/01/2018 09/24/2014  Height 5\' 6"  5\' 6"  5' 8.5"  Weight 204 lbs 3 oz 206 lbs 205 lbs  BMI 32.97 66.06 30.1  Systolic 601 093 235  Diastolic 92 87 72  Pulse 77 71 63    Blood pressure (!) 139/92, pulse 77, height 5\' 6"  (1.676 m), weight 204 lb 3.2 oz (92.6 kg), SpO2 97 %. Body mass index is 32.96 kg/m.   Physical Exam  Constitutional: He appears well-developed and well-nourished. No distress.  HENT:    Head: Atraumatic.  Eyes: Conjunctivae are normal.  Neck: Neck supple. No JVD present. No thyromegaly present.  Cardiovascular: Normal rate, regular rhythm, normal heart sounds, intact distal pulses and normal pulses. Exam reveals no gallop.  No murmur heard. No edema, no JVD  Pulmonary/Chest: Effort normal. He has rales (scattered bilateral).  Abdominal: Soft. Bowel sounds are normal.  Musculoskeletal: Normal range of motion.  Neurological: He is alert.  Skin: Skin is warm and dry.  Psychiatric: He has a normal mood and affect.   Radiology: No results found.  Laboratory examination:   No results for input(s): NA, K, CL, CO2, GLUCOSE, BUN, CREATININE, CALCIUM, GFRNONAA, GFRAA in the last 8760 hours. CMP Latest Ref Rng & Units 09/24/2014 02/20/2014 01/19/2013  Glucose 65 - 99 mg/dL 95 102(H) 101(H)  BUN 7 - 25 mg/dL 14 12 14   Creatinine 0.70 - 1.33 mg/dL 0.69(L) 0.78 0.71  Sodium 135 - 146 mmol/L 137 138 140  Potassium 3.5 - 5.3 mmol/L 4.6 4.7 4.8  Chloride 98 - 110 mmol/L 104 102 101  CO2 20 - 31 mmol/L 26 30 30   Calcium 8.6 - 10.3 mg/dL 9.3 10.0 10.0  Total Protein 6.1 - 8.1 g/dL 7.1 7.6 7.4  Total Bilirubin 0.2 - 1.2 mg/dL 0.7 0.6 0.7  Alkaline Phos 40 - 115 U/L 51 56 53  AST 10 - 35 U/L 19 20 19   ALT 9 - 46 U/L 21 27 32   CBC Latest Ref Rng & Units 10/01/2018 09/24/2014 02/20/2014  WBC 4.0 - 10.5 K/uL 11.5(H) 10.5(A) 7.6  Hemoglobin 13.0 - 17.0 g/dL 15.2 14.0(A) 15.2  Hematocrit 39.0 - 52.0 % 46.5 42.4(A) 44.1  Platelets 150 - 400 K/uL 210 - 200   Lipid Panel     Component Value Date/Time   CHOL 186 09/24/2014 0828   TRIG 247 (H) 09/24/2014 0828   HDL 29 (L) 09/24/2014 0828   CHOLHDL 6.4 (H) 09/24/2014 0828   VLDL 49 (H) 09/24/2014 0828   LDLCALC 108 09/24/2014 0828   HEMOGLOBIN A1C Lab Results  Component Value Date   HGBA1C 5.8 09/24/2014   TSH No results for input(s): TSH in the last 8760 hours. Medications  No Known Allergies   Prior to Admission  medications   Medication Sig Start Date End Date Taking? Authorizing Provider  atorvastatin (LIPITOR) 20 MG tablet daily. 10/02/18  Yes [provider]  CHANTIX STARTING MONTH PAK 0.5 MG X 11 & 1 MG X 42 tablet Has not picked up yet 10/02/18  Yes [provider]  ibuprofen (ADVIL) 200 MG tablet Take 400-800 mg by mouth every 8 (eight) hours as needed (hip  pain.).   Yes [provider]  PAIN RELIEF 500 MG tablet Take 500-1,000 mg by mouth every 6 (six) hours as needed (for hip pain.).   Yes [provider]  SENNOSIDES PO Take 8.5 mg by mouth 3 (three) times daily with meals. Swiss Kriss Herbal Laxative Tablets  Finely Powdered Sun-Dried Leaves of SennaInactive Ingredients: Finely Powdered Strawberry Leaves, Peach Leaves, Anise Seed, Caraway Seed, Hibiscus and Calendula Flowers (for their flavoring), Carminative Principles   Yes [provider]     Current Outpatient Medications  Medication Instructions   atorvastatin (LIPITOR) 20 MG tablet Daily   CHANTIX STARTING MONTH PAK 0.5 MG X 11 & 1 MG X 42 tablet Has not picked up yet   ibuprofen (ADVIL) 400-800 mg, Oral, Every 8 hours PRN   Pain Relief 500-1,000 mg, Oral, Every 6 hours PRN   SENNOSIDES PO 8.5 mg, Oral, 3 times daily with meals, Swiss Kriss Herbal Laxative Tablets Finely Powdered Sun-Dried Leaves of SennaInactive Ingredients: Finely Powdered Strawberry Leaves, Peach Leaves, Anise Seed, Caraway Seed, Hibiscus and Calendula Flowers (for their flavoring), Carminative Principles    Cardiac Studies:   Echocardiogram 11/03/2015  Left ventricle cavity is normal in size. Normal global wall motion. Normal diastolic filling pattern, normal LAP. Calculated EF 64%. Trace mitral regurgitation. Normal echocardiogram.  Treadmill stress test 10/23/2015  Indications: Syncope The resting electrocardiogram demonstrated normal sinus rhythm with an IRBBB, no resting arrhythmias . The stress  electrocardiogram was normal. There were no significant arrhythmias. Patient exercised on Bruce protocol for 8:17 minutes and achieved 91% of Max Predicted HR (Target HR was >85% MPHR) and 10.16 METS. Stress symptoms included fatigue and dyspnea. Normal BP response. Exercise capacity was normal . Impression: Normal stress EKG. No significant arrhythmias. Normal BP response.  Assessment     ICD-10-CM   1. Preoperative cardiovascular examination  Z01.810 EKG 12-Lead  2. Tobacco use disorder  F17.200   3. Incomplete RBBB  I45.10     EKG 10/03/2018: Normal sinus rhythm with rate of 75 bpm with left atrial abnormality.  Normal axis, incomplete right bundle branch.  No evidence of ischemia, normal QT interval.  No significant change from 10/02/2018 and 2017 EKG.  Recommendations:   Patient seen on a stat basis for consultation.  Patient is a Naval architect, still smokes 3-7/1-6 packs of cigarettes a day almost a 50-60-pack-year history of smoking, referred to me for preoperative cardiovascular risk stratification.  Patient fairly active until a year ago reduced his physical activity.  He is had a negative treadmill excess stress test in 2017.  No change in his physical exam except he is now developing COPD and emphysema.  From cardiac standpoint he can be taken up for the upcoming surgery with low risk.  He is already on statin, his blood pressure was slightly elevated may be related to the pain, previously patient's blood pressure has been very well controlled.  I would have a low threshold to start him on antihypertensive medications if his blood pressure continues to be borderline high.  We have discussed extensively regarding smoking cessation, he appears to be motivated.  I will see him back on a p.r.n. basis.  Yates Decamp, MD, [facility] 10/03/2018, 4:44 PM [facility] Pager: [phone] Office: [phone] If no answer Cell [phone]

## 2018-10-05 ENCOUNTER — Other Ambulatory Visit (HOSPITAL_COMMUNITY)
Admission: RE | Admit: 2018-10-05 | Discharge: 2018-10-05 | Disposition: A | Discharge status: Home / Self Care | Payer: PRIVATE HEALTH INSURANCE | Source: Ambulatory Visit | Attending: Orthopedic Surgery | Admitting: Orthopedic Surgery

## 2018-10-05 DIAGNOSIS — Z01812 Encounter for preprocedural laboratory examination: Secondary | ICD-10-CM | POA: Insufficient documentation

## 2018-10-05 DIAGNOSIS — Z20828 Contact with and (suspected) exposure to other viral communicable diseases: Secondary | ICD-10-CM | POA: Diagnosis not present

## 2018-10-06 LAB — NOVEL CORONAVIRUS, NAA (HOSP ORDER, SEND-OUT TO REF LAB; TAT 18-24 HRS): SARS-CoV-2, NAA: NOT DETECTED

## 2018-10-09 ENCOUNTER — Inpatient Hospital Stay (HOSPITAL_COMMUNITY)
Admission: RE | Admit: 2018-10-09 | Discharge: 2018-10-10 | DRG: 470 | Disposition: A | Discharge status: Home / Self Care | Payer: No Typology Code available for payment source | Attending: Orthopedic Surgery | Admitting: Orthopedic Surgery

## 2018-10-09 ENCOUNTER — Other Ambulatory Visit: Payer: Self-pay

## 2018-10-09 ENCOUNTER — Encounter (HOSPITAL_COMMUNITY): Payer: Self-pay | Admitting: *Deleted

## 2018-10-09 ENCOUNTER — Inpatient Hospital Stay (HOSPITAL_COMMUNITY): Payer: No Typology Code available for payment source

## 2018-10-09 ENCOUNTER — Encounter (HOSPITAL_COMMUNITY)
Admission: RE | Disposition: A | Discharge status: Home / Self Care | Payer: Self-pay | Source: Home / Self Care | Attending: Orthopedic Surgery

## 2018-10-09 ENCOUNTER — Inpatient Hospital Stay (HOSPITAL_COMMUNITY): Payer: No Typology Code available for payment source | Admitting: Certified Registered"

## 2018-10-09 ENCOUNTER — Inpatient Hospital Stay (HOSPITAL_COMMUNITY): Payer: No Typology Code available for payment source | Admitting: Physician Assistant

## 2018-10-09 DIAGNOSIS — R911 Solitary pulmonary nodule: Secondary | ICD-10-CM | POA: Diagnosis present

## 2018-10-09 DIAGNOSIS — E785 Hyperlipidemia, unspecified: Secondary | ICD-10-CM | POA: Diagnosis present

## 2018-10-09 DIAGNOSIS — Z79899 Other long term (current) drug therapy: Secondary | ICD-10-CM

## 2018-10-09 DIAGNOSIS — Z20828 Contact with and (suspected) exposure to other viral communicable diseases: Secondary | ICD-10-CM | POA: Diagnosis present

## 2018-10-09 DIAGNOSIS — K5900 Constipation, unspecified: Secondary | ICD-10-CM | POA: Diagnosis present

## 2018-10-09 DIAGNOSIS — Z96642 Presence of left artificial hip joint: Secondary | ICD-10-CM

## 2018-10-09 DIAGNOSIS — Z419 Encounter for procedure for purposes other than remedying health state, unspecified: Secondary | ICD-10-CM

## 2018-10-09 DIAGNOSIS — M1612 Unilateral primary osteoarthritis, left hip: Secondary | ICD-10-CM | POA: Diagnosis present

## 2018-10-09 DIAGNOSIS — F17210 Nicotine dependence, cigarettes, uncomplicated: Secondary | ICD-10-CM | POA: Diagnosis present

## 2018-10-09 HISTORY — PX: TOTAL HIP ARTHROPLASTY: SHX124

## 2018-10-09 LAB — TYPE AND SCREEN
ABO/RH(D): B NEG
Antibody Screen: NEGATIVE

## 2018-10-09 SURGERY — ARTHROPLASTY, HIP, TOTAL, ANTERIOR APPROACH
Anesthesia: Spinal | Site: Hip | Laterality: Left

## 2018-10-09 MED ORDER — CEFAZOLIN SODIUM-DEXTROSE 1-4 GM/50ML-% IV SOLN
1.0000 g | Freq: Four times a day (QID) | INTRAVENOUS | Status: AC
  Administered 2018-10-09 (×2): 1 g via INTRAVENOUS
  Filled 2018-10-09 (×2): qty 50

## 2018-10-09 MED ORDER — FENTANYL CITRATE (PF) 100 MCG/2ML IJ SOLN
INTRAMUSCULAR | Status: AC
  Filled 2018-10-09: qty 2

## 2018-10-09 MED ORDER — PANTOPRAZOLE SODIUM 40 MG PO TBEC
40.0000 mg | DELAYED_RELEASE_TABLET | Freq: Every day | ORAL | Status: DC
  Administered 2018-10-10: 40 mg via ORAL
  Filled 2018-10-09: qty 1

## 2018-10-09 MED ORDER — ONDANSETRON HCL 4 MG/2ML IJ SOLN: 4.0000 mg | Freq: Four times a day (QID) | INTRAMUSCULAR | Status: DC | PRN

## 2018-10-09 MED ORDER — PROPOFOL 10 MG/ML IV BOLUS
INTRAVENOUS | Status: AC
  Filled 2018-10-09: qty 20

## 2018-10-09 MED ORDER — PROPOFOL 10 MG/ML IV BOLUS
INTRAVENOUS | Status: DC | PRN
  Administered 2018-10-09 (×2): 10 mg via INTRAVENOUS

## 2018-10-09 MED ORDER — MIDAZOLAM HCL 2 MG/2ML IJ SOLN
INTRAMUSCULAR | Status: DC | PRN
  Administered 2018-10-09: 2 mg via INTRAVENOUS

## 2018-10-09 MED ORDER — ONDANSETRON HCL 4 MG PO TABS: 4.0000 mg | ORAL_TABLET | Freq: Four times a day (QID) | ORAL | Status: DC | PRN

## 2018-10-09 MED ORDER — ASPIRIN 81 MG PO CHEW
81.0000 mg | CHEWABLE_TABLET | Freq: Two times a day (BID) | ORAL | Status: DC
  Administered 2018-10-09 – 2018-10-10 (×2): 81 mg via ORAL
  Filled 2018-10-09 (×2): qty 1

## 2018-10-09 MED ORDER — METOCLOPRAMIDE HCL 5 MG PO TABS: 5.0000 mg | ORAL_TABLET | Freq: Three times a day (TID) | ORAL | Status: DC | PRN

## 2018-10-09 MED ORDER — DEXAMETHASONE SODIUM PHOSPHATE 10 MG/ML IJ SOLN: 10.0000 mg | Freq: Once | INTRAMUSCULAR | Status: DC

## 2018-10-09 MED ORDER — CHLORHEXIDINE GLUCONATE 4 % EX LIQD: 60.0000 mL | Freq: Once | CUTANEOUS | Status: DC

## 2018-10-09 MED ORDER — DEXAMETHASONE SODIUM PHOSPHATE 10 MG/ML IJ SOLN
INTRAMUSCULAR | Status: AC
  Filled 2018-10-09: qty 1

## 2018-10-09 MED ORDER — FERROUS SULFATE 325 (65 FE) MG PO TABS
325.0000 mg | ORAL_TABLET | Freq: Two times a day (BID) | ORAL | Status: DC
  Administered 2018-10-09 – 2018-10-10 (×2): 325 mg via ORAL
  Filled 2018-10-09 (×2): qty 1

## 2018-10-09 MED ORDER — METHOCARBAMOL 500 MG PO TABS
500.0000 mg | ORAL_TABLET | Freq: Four times a day (QID) | ORAL | Status: DC | PRN
  Administered 2018-10-10: 06:00:00 500 mg via ORAL
  Filled 2018-10-09: qty 1

## 2018-10-09 MED ORDER — TRANEXAMIC ACID-NACL 1000-0.7 MG/100ML-% IV SOLN
1000.0000 mg | INTRAVENOUS | Status: AC
  Administered 2018-10-09: 1000 mg via INTRAVENOUS
  Filled 2018-10-09: qty 100

## 2018-10-09 MED ORDER — 0.9 % SODIUM CHLORIDE (POUR BTL) OPTIME
TOPICAL | Status: DC | PRN
  Administered 2018-10-09: 10:00:00 1000 mL

## 2018-10-09 MED ORDER — PROPOFOL 10 MG/ML IV BOLUS
INTRAVENOUS | Status: AC
  Filled 2018-10-09: qty 80

## 2018-10-09 MED ORDER — DOCUSATE SODIUM 100 MG PO CAPS
100.0000 mg | ORAL_CAPSULE | Freq: Two times a day (BID) | ORAL | Status: DC
  Administered 2018-10-09 – 2018-10-10 (×2): 100 mg via ORAL
  Filled 2018-10-09 (×2): qty 1

## 2018-10-09 MED ORDER — ALUM & MAG HYDROXIDE-SIMETH 200-200-20 MG/5ML PO SUSP: 30.0000 mL | ORAL | Status: DC | PRN

## 2018-10-09 MED ORDER — ATORVASTATIN CALCIUM 20 MG PO TABS
20.0000 mg | ORAL_TABLET | Freq: Every day | ORAL | Status: DC
  Administered 2018-10-09 – 2018-10-10 (×2): 20 mg via ORAL
  Filled 2018-10-09 (×2): qty 1

## 2018-10-09 MED ORDER — BUPIVACAINE IN DEXTROSE 0.75-8.25 % IT SOLN
INTRATHECAL | Status: DC | PRN
  Administered 2018-10-09: 1.8 mL via INTRATHECAL

## 2018-10-09 MED ORDER — TRANEXAMIC ACID-NACL 1000-0.7 MG/100ML-% IV SOLN
1000.0000 mg | Freq: Once | INTRAVENOUS | Status: AC
  Administered 2018-10-09: 13:00:00 1000 mg via INTRAVENOUS
  Filled 2018-10-09: qty 100

## 2018-10-09 MED ORDER — FENTANYL CITRATE (PF) 100 MCG/2ML IJ SOLN
25.0000 ug | INTRAMUSCULAR | Status: DC | PRN
  Administered 2018-10-09 (×3): 50 ug via INTRAVENOUS

## 2018-10-09 MED ORDER — ONDANSETRON HCL 4 MG/2ML IJ SOLN: 4.0000 mg | Freq: Once | INTRAMUSCULAR | Status: DC | PRN

## 2018-10-09 MED ORDER — ACETAMINOPHEN 325 MG PO TABS
325.0000 mg | ORAL_TABLET | Freq: Four times a day (QID) | ORAL | Status: DC | PRN
  Administered 2018-10-10: 06:00:00 650 mg via ORAL
  Filled 2018-10-09: qty 2

## 2018-10-09 MED ORDER — STERILE WATER FOR IRRIGATION IR SOLN
Status: DC | PRN
  Administered 2018-10-09: 2000 mL

## 2018-10-09 MED ORDER — ONDANSETRON HCL 4 MG/2ML IJ SOLN
INTRAMUSCULAR | Status: DC | PRN
  Administered 2018-10-09: 4 mg via INTRAVENOUS

## 2018-10-09 MED ORDER — HYDROCODONE-ACETAMINOPHEN 7.5-325 MG PO TABS: 1.0000 | ORAL_TABLET | ORAL | Status: DC | PRN

## 2018-10-09 MED ORDER — VANCOMYCIN HCL IN DEXTROSE 1-5 GM/200ML-% IV SOLN
1000.0000 mg | Freq: Once | INTRAVENOUS | Status: AC
  Administered 2018-10-09: 09:00:00 1000 mg via INTRAVENOUS
  Filled 2018-10-09: qty 200

## 2018-10-09 MED ORDER — CEFAZOLIN SODIUM-DEXTROSE 1-4 GM/50ML-% IV SOLN: 1.0000 g | Freq: Four times a day (QID) | INTRAVENOUS | Status: DC

## 2018-10-09 MED ORDER — MENTHOL 3 MG MT LOZG: 1.0000 | LOZENGE | OROMUCOSAL | Status: DC | PRN

## 2018-10-09 MED ORDER — POLYETHYLENE GLYCOL 3350 17 G PO PACK: 17.0000 g | PACK | Freq: Every day | ORAL | Status: DC | PRN

## 2018-10-09 MED ORDER — SODIUM CHLORIDE 0.9 % IV SOLN
INTRAVENOUS | Status: DC
  Administered 2018-10-09: 13:00:00 via INTRAVENOUS

## 2018-10-09 MED ORDER — METOCLOPRAMIDE HCL 5 MG/ML IJ SOLN: 5.0000 mg | Freq: Three times a day (TID) | INTRAMUSCULAR | Status: DC | PRN

## 2018-10-09 MED ORDER — ONDANSETRON HCL 4 MG/2ML IJ SOLN
INTRAMUSCULAR | Status: AC
  Filled 2018-10-09: qty 2

## 2018-10-09 MED ORDER — OXYCODONE HCL 5 MG PO TABS: 5.0000 mg | ORAL_TABLET | Freq: Once | ORAL | Status: DC | PRN

## 2018-10-09 MED ORDER — PHENOL 1.4 % MT LIQD: 1.0000 | OROMUCOSAL | Status: DC | PRN

## 2018-10-09 MED ORDER — METHOCARBAMOL 500 MG IVPB - SIMPLE MED
INTRAVENOUS | Status: AC
  Filled 2018-10-09: qty 50

## 2018-10-09 MED ORDER — METHOCARBAMOL 500 MG IVPB - SIMPLE MED
500.0000 mg | Freq: Four times a day (QID) | INTRAVENOUS | Status: DC | PRN
  Administered 2018-10-09: 11:00:00 500 mg via INTRAVENOUS
  Filled 2018-10-09: qty 50

## 2018-10-09 MED ORDER — DEXAMETHASONE SODIUM PHOSPHATE 10 MG/ML IJ SOLN
10.0000 mg | Freq: Once | INTRAMUSCULAR | Status: AC
  Administered 2018-10-09: 10 mg via INTRAVENOUS

## 2018-10-09 MED ORDER — MIDAZOLAM HCL 2 MG/2ML IJ SOLN
INTRAMUSCULAR | Status: AC
  Filled 2018-10-09: qty 2

## 2018-10-09 MED ORDER — HYDROCODONE-ACETAMINOPHEN 5-325 MG PO TABS
1.0000 | ORAL_TABLET | ORAL | Status: DC | PRN
  Administered 2018-10-09: 21:00:00 2 via ORAL
  Administered 2018-10-09: 13:00:00 1 via ORAL
  Administered 2018-10-09 – 2018-10-10 (×3): 2 via ORAL
  Filled 2018-10-09: qty 2
  Filled 2018-10-09: qty 1
  Filled 2018-10-09 (×2): qty 2
  Filled 2018-10-09 (×2): qty 1

## 2018-10-09 MED ORDER — PROPOFOL 500 MG/50ML IV EMUL
INTRAVENOUS | Status: DC | PRN
  Administered 2018-10-09: 125 ug/kg/min via INTRAVENOUS

## 2018-10-09 MED ORDER — CEFAZOLIN SODIUM-DEXTROSE 2-4 GM/100ML-% IV SOLN
2.0000 g | INTRAVENOUS | Status: AC
  Administered 2018-10-09: 2 g via INTRAVENOUS
  Filled 2018-10-09: qty 100

## 2018-10-09 MED ORDER — HYDROMORPHONE HCL 1 MG/ML IJ SOLN: 0.5000 mg | INTRAMUSCULAR | Status: DC | PRN

## 2018-10-09 MED ORDER — OXYCODONE HCL 5 MG/5ML PO SOLN: 5.0000 mg | Freq: Once | ORAL | Status: DC | PRN

## 2018-10-09 MED ORDER — LACTATED RINGERS IV SOLN
INTRAVENOUS | Status: DC
  Administered 2018-10-09 (×2): via INTRAVENOUS

## 2018-10-09 SURGICAL SUPPLY — 46 items
BAG DECANTER FOR FLEXI CONT (MISCELLANEOUS) IMPLANT
BAG ZIPLOCK 12X15 (MISCELLANEOUS) IMPLANT
BLADE SAG 18X100X1.27 (BLADE) ×3 IMPLANT
BLADE SURG SZ10 CARB STEEL (BLADE) ×6 IMPLANT
COVER PERINEAL POST (MISCELLANEOUS) ×3 IMPLANT
COVER SURGICAL LIGHT HANDLE (MISCELLANEOUS) ×3 IMPLANT
COVER WAND RF STERILE (DRAPES) ×3 IMPLANT
CUP ACETBLR 54 OD PINNACLE (Hips) ×3 IMPLANT
DERMABOND ADVANCED (GAUZE/BANDAGES/DRESSINGS) ×2
DERMABOND ADVANCED .7 DNX12 (GAUZE/BANDAGES/DRESSINGS) ×1 IMPLANT
DRAPE STERI IOBAN 125X83 (DRAPES) ×3 IMPLANT
DRAPE U-SHAPE 47X51 STRL (DRAPES) ×6 IMPLANT
DRESSING AQUACEL AG SP 3.5X10 (GAUZE/BANDAGES/DRESSINGS) ×1 IMPLANT
DRSG AQUACEL AG ADV 3.5X10 (GAUZE/BANDAGES/DRESSINGS) ×3 IMPLANT
DRSG AQUACEL AG SP 3.5X10 (GAUZE/BANDAGES/DRESSINGS) ×3
DURAPREP 26ML APPLICATOR (WOUND CARE) ×3 IMPLANT
ELECT BLADE TIP CTD 4 INCH (ELECTRODE) ×3 IMPLANT
ELECT REM PT RETURN 15FT ADLT (MISCELLANEOUS) ×3 IMPLANT
ELIMINATOR HOLE APEX DEPUY (Hips) ×3 IMPLANT
GLOVE BIO SURGEON STRL SZ 6 (GLOVE) ×6 IMPLANT
GLOVE BIOGEL PI IND STRL 6.5 (GLOVE) ×1 IMPLANT
GLOVE BIOGEL PI IND STRL 7.5 (GLOVE) ×1 IMPLANT
GLOVE BIOGEL PI IND STRL 8.5 (GLOVE) ×1 IMPLANT
GLOVE BIOGEL PI INDICATOR 6.5 (GLOVE) ×2
GLOVE BIOGEL PI INDICATOR 7.5 (GLOVE) ×2
GLOVE BIOGEL PI INDICATOR 8.5 (GLOVE) ×2
GLOVE ECLIPSE 8.0 STRL XLNG CF (GLOVE) ×6 IMPLANT
GLOVE ORTHO TXT STRL SZ7.5 (GLOVE) ×6 IMPLANT
GOWN STRL REUS W/TWL LRG LVL3 (GOWN DISPOSABLE) ×6 IMPLANT
GOWN STRL REUS W/TWL XL LVL3 (GOWN DISPOSABLE) ×3 IMPLANT
HEAD CERAMIC 36 PLUS5 (Hips) ×3 IMPLANT
HOLDER FOLEY CATH W/STRAP (MISCELLANEOUS) ×3 IMPLANT
KIT TURNOVER KIT A (KITS) IMPLANT
LINER NEUTRAL 54X36MM PLUS 4 (Hips) ×3 IMPLANT
PACK ANTERIOR HIP CUSTOM (KITS) ×3 IMPLANT
SCREW 6.5MMX25MM (Screw) ×3 IMPLANT
STEM FEM ACTIS HIGH SZ7 (Stem) ×3 IMPLANT
SUT MNCRL AB 4-0 PS2 18 (SUTURE) ×3 IMPLANT
SUT STRATAFIX 0 PDS 27 VIOLET (SUTURE) ×3
SUT VIC AB 1 CT1 36 (SUTURE) ×9 IMPLANT
SUT VIC AB 2-0 CT1 27 (SUTURE) ×4
SUT VIC AB 2-0 CT1 TAPERPNT 27 (SUTURE) ×2 IMPLANT
SUTURE STRATFX 0 PDS 27 VIOLET (SUTURE) ×1 IMPLANT
TRAY FOLEY MTR SLVR 16FR STAT (SET/KITS/TRAYS/PACK) ×3 IMPLANT
WATER STERILE IRR 1000ML POUR (IV SOLUTION) ×3 IMPLANT
YANKAUER SUCT BULB TIP 10FT TU (MISCELLANEOUS) ×3 IMPLANT

## 2018-10-09 NOTE — Evaluation (Signed)
Physical Therapy Evaluation Patient Details Name: Raymond Velez MRN: [id number] DOB: 08/04/1955 Today's Date: 10/09/2018   History of Present Illness  Patient is 63 y.o. male s/p Lt THA ant approach on 10/09/18 with PMH significant for HLD and OA.  Clinical Impression  Raymond Velez is a 63 y.o. male POD 0 s/p Lt THA ant approach. Patient reports independence with mobility at baseline. Patient is now limited by functional impairments (see PT problem list below) and requires min assist for transfers and gait with RW. Patient was able to ambulate ~80 feet with RW and minima cuing for safe use. Patient instructed in exercise to facilitate ROM and circulation to manage edema. Patient will benefit from continued skilled PT interventions to address impairments and progress towards PLOF. He would like to participate in additional therapy to improve mobility once discharge home and will benefit form HHPT follow up. Acute PT will follow to progress mobility and stair training in preparation for safe discharge home.     Follow Up Recommendations Home health PT    Equipment Recommendations  Rolling walker with 5" wheels    Recommendations for Other Services       Precautions / Restrictions Precautions Precautions: Fall Restrictions Weight Bearing Restrictions: No      Mobility  Bed Mobility Overal bed mobility: Needs Assistance Bed Mobility: Supine to Sit     Supine to sit: Supervision     General bed mobility comments: pt using be rails to trasnfers, able to move bil LE's without assist however significant effort to do so  Transfers Overall transfer level: Needs assistance Equipment used: Rolling walker (2 wheeled) Transfers: Sit to/from Stand Sit to Stand: Min assist         General transfer comment: cues for safe hand placement and technique, assist required to initiate power up  Ambulation/Gait Ambulation/Gait assistance: Min assist Gait Distance (Feet): 80  Feet Assistive device: Rolling walker (2 wheeled) Gait Pattern/deviations: Step-through pattern;Decreased step length - left;Decreased stride length;Decreased stance time - left;Decreased step length - right;Narrow base of support Gait velocity: decreased   General Gait Details: pt with NBOS requiring cues to increase step width, verbal cues at start for safe hand placement on RW and pt maintained throughout, mild sway while ambulating requiring intermittent assist to prevent LOB, cues for safe use of RW to keep it on floor and roll rather than lift it up  Stairs            Wheelchair Mobility    Modified Rankin (Stroke Patients Only)       Balance Overall balance assessment: Needs assistance Sitting-balance support: No upper extremity supported;Feet supported Sitting balance-Leahy Scale: Good     Standing balance support: During functional activity;Bilateral upper extremity supported Standing balance-Leahy Scale: Fair Standing balance comment: pt performed static standing no support, support required for dynamic stand and gait                             Pertinent Vitals/Pain Pain Assessment: 0-10 Pain Score: 3  Pain Location: Lt hip Pain Descriptors / Indicators: Discomfort;Burning Pain Intervention(s): Limited activity within patient's tolerance;Monitored during session;Ice applied;Repositioned    Home Living Family/patient expects to be discharged to:: Private residence Living Arrangements: Spouse/significant other Available Help at Discharge: Family(pt's wife will be home for the week to assist him and his children live in [place]) Type of Home: House Home Access: Level entry     Home Layout: Able to live  on main level with bedroom/bathroom;Full bath on main level Home Equipment: None      Prior Function Level of Independence: Independent         Comments: pt was mobilizing without device prior to surgery     Hand Dominance         Extremity/Trunk Assessment   Upper Extremity Assessment Upper Extremity Assessment: Overall WFL for tasks assessed    Lower Extremity Assessment Lower Extremity Assessment: LLE deficits/detail LLE Deficits / Details: pt with good quad set in supine, sensation for light touch is intact    Cervical / Trunk Assessment Cervical / Trunk Assessment: Normal  Communication   Communication: No difficulties  Cognition Arousal/Alertness: Awake/alert Behavior During Therapy: WFL for tasks assessed/performed Overall Cognitive Status: Within Functional Limits for tasks assessed                   General Comments      Exercises Total Joint Exercises Ankle Circles/Pumps: AROM;Both;Seated;10 reps Quad Sets: AROM;10 reps;Seated;Supine;Left(2 sets (1x5 supine, 1x5 seated)) Hip ABduction/ADduction: AROM;5 reps;Left;Supine   Assessment/Plan    PT Assessment Patient needs continued PT services  PT Problem List Decreased strength;Decreased balance;Decreased range of motion;Decreased mobility;Decreased activity tolerance;Decreased knowledge of use of DME       PT Treatment Interventions Functional mobility training;DME instruction;Balance training;Patient/family education;Gait training;Therapeutic activities;Therapeutic exercise;Stair training;Modalities    PT Goals (Current goals can be found in the Care Plan section)  Acute Rehab PT Goals Patient Stated Goal: to return home and to work, become independent again Time For Goal Achievement: 10/16/18 Potential to Achieve Goals: Good    Frequency 7X/week    AM-PAC PT "6 Clicks" Mobility  Outcome Measure Help needed turning from your back to your side while in a flat bed without using bedrails?: A Little Help needed moving from lying on your back to sitting on the side of a flat bed without using bedrails?: A Little Help needed moving to and from a bed to a chair (including a wheelchair)?: A Little Help needed standing up from a chair  using your arms (e.g., wheelchair or bedside chair)?: A Little Help needed to walk in hospital room?: A Little Help needed climbing 3-5 steps with a railing? : A Little 6 Click Score: 18    End of Session Equipment Utilized During Treatment: Gait belt Activity Tolerance: Patient tolerated treatment well Patient left: in chair;with call bell/phone within reach;with chair alarm set Nurse Communication: Mobility status PT Visit Diagnosis: Unsteadiness on feet (R26.81);Muscle weakness (generalized) (M62.81);Other abnormalities of gait and mobility (R26.89)    Time: 1829-9371 PT Time Calculation (min) (ACUTE ONLY): 21 min   Charges:   PT Evaluation $PT Eval Low Complexity: 1 Low          Valentino Saxon, PT, DPT, [facility] Physical Therapist with [facility]  10/09/2018 5:11 PM

## 2018-10-09 NOTE — Anesthesia Postprocedure Evaluation (Signed)
Anesthesia Post Note  Patient: Raymond Velez  Procedure(s) Performed: TOTAL HIP ARTHROPLASTY ANTERIOR APPROACH (Left Hip)     Patient location during evaluation: PACU Anesthesia Type: Spinal Level of consciousness: oriented and awake and alert Pain management: pain level controlled Vital Signs Assessment: post-procedure vital signs reviewed and stable Respiratory status: spontaneous breathing, respiratory function stable and patient connected to nasal cannula oxygen Cardiovascular status: blood pressure returned to baseline and stable Postop Assessment: no headache, no backache and no apparent nausea or vomiting Anesthetic complications: no    Last Vitals:  Vitals:   10/09/18 1045 10/09/18 1100  BP: 99/73 98/74  Pulse: (!) 43 (!) 47  Resp: 18 17  Temp:    SpO2: 99% 100%    Last Pain:  Vitals:   10/09/18 1100  TempSrc:   PainSc: Asleep                 Adan Baehr COKER

## 2018-10-09 NOTE — Plan of Care (Signed)
Plan of care reviewed and discussed with the patient. 

## 2018-10-09 NOTE — Interval H&P Note (Signed)
History and Physical Interval Note:  10/09/2018 7:07 AM  Raymond Velez  has presented today for surgery, with the diagnosis of Left hip osteoarthritis.  The various methods of treatment have been discussed with the patient and family. After consideration of risks, benefits and other options for treatment, the patient has consented to  Procedure(s) with comments: TOTAL HIP ARTHROPLASTY ANTERIOR APPROACH (Left) - 70 mins as a surgical intervention.  The patient's history has been reviewed, patient examined, no change in status, stable for surgery.  I have reviewed the patient's chart and labs.  Questions were answered to the patient's satisfaction.     Mauri Pole

## 2018-10-09 NOTE — Anesthesia Procedure Notes (Signed)
Spinal  Patient location during procedure: OR Start time: 10/09/2018 8:43 AM End time: 10/09/2018 8:47 AM Staffing Resident/CRNA: Niel Hummer, CRNA Performed: resident/CRNA  Preanesthetic Checklist Completed: patient identified, surgical consent, pre-op evaluation, IV checked, risks and benefits discussed and monitors and equipment checked Spinal Block Patient position: sitting Prep: DuraPrep Patient monitoring: heart rate, continuous pulse ox and blood pressure Approach: midline Location: L3-4 Injection technique: single-shot Needle Needle type: Pencan  Needle gauge: 24 G

## 2018-10-09 NOTE — Transfer of Care (Signed)
Immediate Anesthesia Transfer of Care Note  Patient: Raymond Velez  Procedure(s) Performed: TOTAL HIP ARTHROPLASTY ANTERIOR APPROACH (Left Hip)  Patient Location: PACU  Anesthesia Type:MAC and Spinal  Level of Consciousness: awake, alert , oriented and patient cooperative  Airway & Oxygen Therapy: Patient Spontanous Breathing and Patient connected to face mask oxygen  Post-op Assessment: Report given to RN and Post -op Vital signs reviewed and stable  Post vital signs: Reviewed and stable  Last Vitals:  Vitals Value Taken Time  BP 86/67 10/09/18 1036  Temp    Pulse 56 10/09/18 1037  Resp 27 10/09/18 1037  SpO2 96 % 10/09/18 1037  Vitals shown include unvalidated device data.  Last Pain:  Vitals:   10/09/18 0704  TempSrc:   PainSc: 0-No pain      Patients Stated Pain Goal: 3 (91/66/06 0045)  Complications: No apparent anesthesia complications

## 2018-10-09 NOTE — Anesthesia Preprocedure Evaluation (Signed)
Anesthesia Evaluation  Patient identified by MRN, date of birth, ID band Patient awake    Reviewed: Allergy & Precautions, NPO status , Patient's Chart, lab work & pertinent test results  Airway Mallampati: II  TM Distance: >3 FB Neck ROM: Full    Dental  (+) Teeth Intact, Dental Advisory Given   Pulmonary Current Smoker and Patient abstained from smoking.,    breath sounds clear to auscultation       Cardiovascular  Rhythm:Regular Rate:Normal     Neuro/Psych    GI/Hepatic   Endo/Other    Renal/GU      Musculoskeletal   Abdominal   Peds  Hematology   Anesthesia Other Findings   Reproductive/Obstetrics                             Anesthesia Physical Anesthesia Plan  ASA: II  Anesthesia Plan: Spinal   Post-op Pain Management:    Induction: Intravenous  PONV Risk Score and Plan: Ondansetron and Dexamethasone  Airway Management Planned: Natural Airway and Simple Face Mask  Additional Equipment:   Intra-op Plan:   Post-operative Plan:   Informed Consent: I have reviewed the patients History and Physical, chart, labs and discussed the procedure including the risks, benefits and alternatives for the proposed anesthesia with the patient or authorized representative who has indicated his/her understanding and acceptance.     Dental advisory given  Plan Discussed with: CRNA and Anesthesiologist  Anesthesia Plan Comments:         Anesthesia Quick Evaluation

## 2018-10-09 NOTE — Plan of Care (Signed)
Plan of care 

## 2018-10-09 NOTE — Anesthesia Procedure Notes (Signed)
Procedure Name: MAC Date/Time: 10/09/2018 8:41 AM Performed by: Niel Hummer, CRNA Pre-anesthesia Checklist: Patient identified, Emergency Drugs available, Suction available and Patient being monitored Patient Re-evaluated:Patient Re-evaluated prior to induction Oxygen Delivery Method: Simple face mask

## 2018-10-09 NOTE — Op Note (Signed)
NAMEDominie Benedick                ACCOUNT NO.: [id number]      MEDICAL RECORD NO.: [id number]      FACILITY:  [facility]      PHYSICIAN:  Mauri Pole  DATE OF BIRTH:  Aug 23, 1955     DATE OF PROCEDURE:  10/09/2018                                 OPERATIVE REPORT         PREOPERATIVE DIAGNOSIS: Left  hip osteoarthritis.      POSTOPERATIVE DIAGNOSIS:  Left hip osteoarthritis.      PROCEDURE:  Left total hip replacement through an anterior approach   utilizing DePuy THR system, component size 85mm pinnacle cup, a size 36+4 neutral   Altrex liner, a size 7 Hi Actis stem with a 36+5 delta ceramic   ball.      SURGEON:  Pietro Cassis. Alvan Dame, M.D.      ASSISTANT:  Griffith Citron, PA-C     ANESTHESIA:  Spinal.      SPECIMENS:  None.      COMPLICATIONS:  None.      BLOOD LOSS:  650 cc     DRAINS:  None.      INDICATION OF THE PROCEDURE:  Abdulkarim Eberlin is a 63 y.o. male who had   presented to office for evaluation of left hip pain.  Radiographs revealed   progressive degenerative changes with bone-on-bone   articulation of the  hip joint, including subchondral cystic changes and osteophytes.  The patient had painful limited range of   motion significantly affecting their overall quality of life and function.  The patient was failing to    respond to conservative measures including medications and/or injections and activity modification and at this point was ready   to proceed with more definitive measures.  Consent was obtained for   benefit of pain relief.  Specific risks of infection, DVT, component   failure, dislocation, neurovascular injury, and need for revision surgery were reviewed in the office as well discussion of   the anterior versus posterior approach were reviewed.     PROCEDURE IN DETAIL:  The patient was brought to operative theater.   Once adequate anesthesia, preoperative antibiotics, 2 gm of Ancef, 1 gm of Tranexamic Acid, and 10 mg of  Decadron were administered, the patient was positioned supine on the [employer] table.  Once the patient was safely positioned with adequate padding of boney prominences we predraped out the hip, and used fluoroscopy to confirm orientation of the pelvis.      The left hip was then prepped and draped from proximal iliac crest to   mid thigh with a shower curtain technique.      Time-out was performed identifying the patient, planned procedure, and the appropriate extremity.     An incision was then made 2 cm lateral to the   anterior superior iliac spine extending over the orientation of the   tensor fascia lata muscle and sharp dissection was carried down to the   fascia of the muscle.      The fascia was then incised.  The muscle belly was identified and swept   laterally and retractor placed along the superior neck.  Following   cauterization of the circumflex vessels and removing some pericapsular  fat, a second cobra retractor was placed on the inferior neck.  A T-capsulotomy was made along the line of the   superior neck to the trochanteric fossa, then extended proximally and   distally.  Tag sutures were placed and the retractors were then placed   intracapsular.  We then identified the trochanteric fossa and   orientation of my neck cut and then made a neck osteotomy with the femur on traction.  The femoral   head was removed without difficulty or complication.  Traction was let   off and retractors were placed posterior and anterior around the   acetabulum.      The labrum and foveal tissue were debrided.  I began reaming with a 47 mm   reamer and reamed up to 53 mm reamer with good bony bed preparation and a 54 mm  cup was chosen.  The final 54 mm Pinnacle cup was then impacted under fluoroscopy to confirm the depth of penetration and orientation with respect to   Abduction and forward flexion.  A screw was placed into the ilium followed by the hole eliminator.  The final   36+4  neutral Altrex liner was impacted with good visualized rim fit.  The cup was positioned anatomically within the acetabular portion of the pelvis.      At this point, the femur was rolled to 100 degrees.  Further capsule was   released off the inferior aspect of the femoral neck.  I then   released the superior capsule proximally.  With the leg in a neutral position the hook was placed laterally   along the femur under the vastus lateralis origin and elevated manually and then held in position using the hook attachment on the bed.  The leg was then extended and adducted with the leg rolled to 100   degrees of external rotation.  Retractors were placed along the medial calcar and posteriorly over the greater trochanter.  Once the proximal femur was fully   exposed, I used a box osteotome to set orientation.  I then began   broaching with the starting chili pepper broach and passed this by hand and then broached up to 7.  With the 7 broach in place I chose a high offset neck and did several trial reductions.  The offset was appropriate, leg lengths   appeared to be equal best matched with the +5 head ball trial confirmed radiographically.   Given these findings, I went ahead and dislocated the hip, repositioned all   retractors and positioned the right hip in the extended and abducted position.  The final 7 Hi Actis stem was   chosen and it was impacted down to the level of neck cut.  Based on this   and the trial reductions, a final 36+5 delta ceramic ball was chosen and   impacted onto a clean and dry trunnion, and the hip was reduced.  The   hip had been irrigated throughout the case again at this point.  I did   reapproximate the superior capsular leaflet to the anterior leaflet   using #1 Vicryl.  The fascia of the   tensor fascia lata muscle was then reapproximated using #1 Vicryl and #0 Stratafix sutures.  The   remaining wound was closed with 2-0 Vicryl and running 4-0 Monocryl.   The hip  was cleaned, dried, and dressed sterilely using Dermabond and   Aquacel dressing.  The patient was then brought   to recovery room in  stable condition tolerating the procedure well.    Dennie BibleAshley Stinson, PA-C was present for the entirety of the case involved from   preoperative positioning, perioperative retractor management, general   facilitation of the case, as well as primary wound closure as assistant.            Madlyn FrankelMatthew D. Charlann Boxerlin, M.D.        10/09/2018 7:23 AM

## 2018-10-10 ENCOUNTER — Encounter (HOSPITAL_COMMUNITY): Payer: Self-pay | Admitting: Orthopedic Surgery

## 2018-10-10 LAB — CBC
HCT: 37.9 % — ABNORMAL LOW (ref 39.0–52.0)
Hemoglobin: 12.1 g/dL — ABNORMAL LOW (ref 13.0–17.0)
MCH: 30 pg (ref 26.0–34.0)
MCHC: 31.9 g/dL (ref 30.0–36.0)
MCV: 94 fL (ref 80.0–100.0)
Platelets: 202 10*3/uL (ref 150–400)
RBC: 4.03 MIL/uL — ABNORMAL LOW (ref 4.22–5.81)
RDW: 12.9 % (ref 11.5–15.5)
WBC: 18.5 10*3/uL — ABNORMAL HIGH (ref 4.0–10.5)
nRBC: 0 % (ref 0.0–0.2)

## 2018-10-10 LAB — BASIC METABOLIC PANEL
Anion gap: 7 (ref 5–15)
BUN: 13 mg/dL (ref 8–23)
CO2: 26 mmol/L (ref 22–32)
Calcium: 8.7 mg/dL — ABNORMAL LOW (ref 8.9–10.3)
Chloride: 101 mmol/L (ref 98–111)
Creatinine, Ser: 0.66 mg/dL (ref 0.61–1.24)
GFR calc Af Amer: >60 mL/min (ref >60)
GFR calc non Af Amer: >60 mL/min (ref >60)
Glucose, Bld: 158 mg/dL — ABNORMAL HIGH (ref 70–99)
Potassium: 4.3 mmol/L (ref 3.5–5.1)
Sodium: 134 mmol/L — ABNORMAL LOW (ref 135–145)

## 2018-10-10 MED ORDER — POLYETHYLENE GLYCOL 3350 17 G PO PACK: 17.0000 g | PACK | Freq: Every day | ORAL | 0 refills | Status: DC | PRN

## 2018-10-10 MED ORDER — METHOCARBAMOL 500 MG PO TABS: 500.0000 mg | ORAL_TABLET | Freq: Four times a day (QID) | ORAL | 0 refills | Status: DC | PRN

## 2018-10-10 MED ORDER — ASPIRIN 81 MG PO CHEW: 81.0000 mg | CHEWABLE_TABLET | Freq: Two times a day (BID) | ORAL | 0 refills | Status: AC

## 2018-10-10 MED ORDER — HYDROCODONE-ACETAMINOPHEN 7.5-325 MG PO TABS: 1.0000 | ORAL_TABLET | Freq: Four times a day (QID) | ORAL | 0 refills | Status: DC | PRN

## 2018-10-10 MED ORDER — FERROUS SULFATE 325 (65 FE) MG PO TABS: 325.0000 mg | ORAL_TABLET | Freq: Two times a day (BID) | ORAL | 0 refills | Status: DC

## 2018-10-10 NOTE — Plan of Care (Signed)
resolved 

## 2018-10-10 NOTE — Progress Notes (Signed)
   Subjective: 1 Day Post-Op Procedure(s) (LRB): TOTAL HIP ARTHROPLASTY ANTERIOR APPROACH (Left) Patient reports pain as mild.   Patient seen in rounds with Dr. Alvan Dame. Patient is well, and has had no acute complaints or problems other than discomfort int he left hip. No acute events overnight. Foley catheter removed, positive flatus. Ambulated 80 feet with PT yesterday. Patient states he is ready to go home today.  We will continue therapy today.   Objective: Vital signs in last 24 hours: Temp:  [97.2 F (36.2 C)-98 F (36.7 C)] 97.7 F (36.5 C) (09/30 0529) Pulse Rate:  [43-73] 57 (09/30 0529) Resp:  [14-22] 16 (09/30 0529) BP: (86-123)/(65-76) 116/68 (09/30 0529) SpO2:  [96 %-100 %] 96 % (09/30 0529) Weight:  [93.4 kg] 93.4 kg (09/29 1148)  Intake/Output from previous day:  Intake/Output Summary (Last 24 hours) at 10/10/2018 0806 Last data filed at 10/10/2018 0600 Gross per 24 hour  Intake 4305.36 ml  Output 4075 ml  Net 230.36 ml     Intake/Output this shift: No intake/output data recorded.  Labs: Recent Labs    10/10/18 0312  HGB 12.1*   Recent Labs    10/10/18 0312  WBC 18.5*  RBC 4.03*  HCT 37.9*  PLT 202   Recent Labs    10/10/18 0312  NA 134*  K 4.3  CL 101  CO2 26  BUN 13  CREATININE 0.66  GLUCOSE 158*  CALCIUM 8.7*   No results for input(s): LABPT, INR in the last 72 hours.  Exam: General - Patient is Alert and Oriented Extremity - Neurologically intact Sensation intact distally Intact pulses distally Dorsiflexion/Plantar flexion intact Dressing - dressing C/D/I Motor Function - intact, moving foot and toes well on exam.   Past Medical History:  Diagnosis Date  . Arthritis   . Hyperlipidemia   . RBBB     Assessment/Plan: 1 Day Post-Op Procedure(s) (LRB): TOTAL HIP ARTHROPLASTY ANTERIOR APPROACH (Left) Active Problems:   Status post total replacement of left hip  Estimated body mass index is 33.23 kg/m as calculated from the  following:   Height as of this encounter: 5\' 6"  (1.676 m).   Weight as of this encounter: 93.4 kg. Advance diet Up with therapy D/C IV fluids  DVT Prophylaxis - Aspirin Weight bearing as tolerated. D/C O2 and pulse ox and try on room air.  Plan is to go Home after hospital stay. Plan for discharge home today after 1-2 sessions of therapy as long as he continues to meet his goals. We discussed that he should not need HHPT. He will be home with his wife. He states his wife may need FMLA paperwork filled out, and I instructed him to bring this to the office to be filled out. Follow up in the office in 2 weeks.   Griffith Citron, PA-C Orthopedic Surgery [phone] 10/10/2018, 8:06 AM

## 2018-10-10 NOTE — TOC Transition Note (Signed)
Transition of Care [facility]) - CM/SW Discharge Note   Patient Details  Name: [name] MRN: [id number] Date of Birth: [birth year]  [facility] CM/SW Contact:  Lia Hopping, [place] Phone Number: 10/10/2018, 1:37 PM   Clinical Narrative:    Therapy Plan: HEP DME ordered through [place]   Final next level of care: Home/Self Care Barriers to Discharge: No Barriers Identified   Patient Goals and CMS Choice     Choice offered to / list presented to : NA  Discharge Placement                       Discharge Plan and Services                DME Arranged: 3-N-1, Walker rolling DME Agency: [employer] Date DME Agency Contacted: 10/10/18 Time DME Agency Contacted: 0900 Representative spoke with at DME Agency: [place] ([place]) Interventions     Readmission Risk Interventions No flowsheet data found.

## 2018-10-10 NOTE — Progress Notes (Signed)
Physical Therapy Treatment Patient Details Name: Raymond Velez MRN: [id number] DOB: 07/03/1955 Today's Date: 10/10/2018    History of Present Illness Patient is 63 y.o. male s/p Lt THA ant approach on 10/09/18 with PMH significant for HLD and OA.    PT Comments    Pt progressing well. Ready for d/c from PT standpoint   Follow Up Recommendations  Follow surgeon's recommendation for DC plan and follow-up therapies(HEP)     Equipment Recommendations       Recommendations for Other Services       Precautions / Restrictions Precautions Precautions: Fall Restrictions Weight Bearing Restrictions: No    Mobility  Bed Mobility Overal bed mobility: Needs Assistance             General bed mobility comments: pt in chair   Transfers Overall transfer level: Needs assistance Equipment used: Rolling walker (2 wheeled) Transfers: Sit to/from Stand Sit to Stand: Supervision         General transfer comment: cues for hand placement   Ambulation/Gait Ambulation/Gait assistance: Supervision Gait Distance (Feet): 200 Feet Assistive device: Rolling walker (2 wheeled) Gait Pattern/deviations: Step-through pattern;Decreased stride length;Narrow base of support;Step-to pattern;Decreased stance time - left     General Gait Details: inital step to gait, progressing to step through, cues for widening BOS   Stairs Stairs: (no stairs)           Wheelchair Mobility    Modified Rankin (Stroke Patients Only)       Balance                                            Cognition Arousal/Alertness: Awake/alert Behavior During Therapy: WFL for tasks assessed/performed Overall Cognitive Status: Within Functional Limits for tasks assessed                                        Exercises Total Joint Exercises Ankle Circles/Pumps: AROM;Both;Seated;10 reps Quad Sets: AROM;Both;10 reps Heel Slides: AAROM;AROM;Left;10 reps Hip  ABduction/ADduction: AROM;Left;10 reps;Standing Long Arc Quad: AROM;Left;10 reps;Seated Knee Flexion: AROM;Left;10 reps;Standing Marching in Standing: AROM;Left;5 reps;Standing Standing Hip Extension: AROM;Left;10 reps;Standing    General Comments        Pertinent Vitals/Pain Pain Assessment: 0-10 Pain Score: 7  Pain Location: Lt hip Pain Descriptors / Indicators: Discomfort;Burning Pain Intervention(s): Limited activity within patient's tolerance;Monitored during session;Ice applied;Repositioned    Home Living                      Prior Function            PT Goals (current goals can now be found in the care plan section) Acute Rehab PT Goals Patient Stated Goal: to return home and to work, become independent again Time For Goal Achievement: 10/16/18 Potential to Achieve Goals: Good Progress towards PT goals: Progressing toward goals    Frequency    7X/week      PT Plan Current plan remains appropriate    Co-evaluation              AM-PAC PT "6 Clicks" Mobility   Outcome Measure  Help needed turning from your back to your side while in a flat bed without using bedrails?: A Little Help needed moving from lying on your back to sitting on the  side of a flat bed without using bedrails?: A Little Help needed moving to and from a bed to a chair (including a wheelchair)?: A Little Help needed standing up from a chair using your arms (e.g., wheelchair or bedside chair)?: None Help needed to walk in hospital room?: None Help needed climbing 3-5 steps with a railing? : A Little 6 Click Score: 20    End of Session Equipment Utilized During Treatment: Gait belt Activity Tolerance: Patient tolerated treatment well Patient left: in chair;with call bell/phone within reach;with chair alarm set Nurse Communication: Mobility status PT Visit Diagnosis: Unsteadiness on feet (R26.81);Muscle weakness (generalized) (M62.81);Other abnormalities of gait and mobility  (R26.89)     Time: 2831-5176 PT Time Calculation (min) (ACUTE ONLY): 24 min  Charges:  $Gait Training: 8-22 mins $Therapeutic Exercise: 8-22 mins                     Kenyon Ana, PT  Pager: [phone] Acute Rehab Dept [facility]): [phone]   10/10/2018    [facility] 10/10/2018, 10:22 AM

## 2018-10-10 NOTE — Discharge Instructions (Signed)
FMLA: If patient's wife needs FMLA paperwork filled out, she should bring this paperwork to the office to have completed.   INSTRUCTIONS AFTER JOINT REPLACEMENT   o Remove items at home which could result in a fall. This includes throw rugs or furniture in walking pathways o ICE to the affected joint every three hours while awake for 30 minutes at a time, for at least the first 3-5 days, and then as needed for pain and swelling.  Continue to use ice for pain and swelling. You may notice swelling that will progress down to the foot and ankle.  This is normal after surgery.  Elevate your leg when you are not up walking on it.   o Continue to use the breathing machine you got in the hospital (incentive spirometer) which will help keep your temperature down.  It is common for your temperature to cycle up and down following surgery, especially at night when you are not up moving around and exerting yourself.  The breathing machine keeps your lungs expanded and your temperature down.   DIET:  As you were doing prior to hospitalization, we recommend a well-balanced diet.  DRESSING / WOUND CARE / SHOWERING  Keep the surgical dressing until follow up.  The dressing is water proof, so you can shower without any extra covering.  IF THE DRESSING FALLS OFF or the wound gets wet inside, change the dressing with sterile gauze.  Please use good hand washing techniques before changing the dressing.  Do not use any lotions or creams on the incision until instructed by your surgeon.    ACTIVITY  o Increase activity slowly as tolerated, but follow the weight bearing instructions below.   o No driving for 6 weeks or until further direction given by your physician.  You cannot drive while taking narcotics.  o No lifting or carrying greater than 10 lbs. until further directed by your surgeon. o Avoid periods of inactivity such as sitting longer than an hour when not asleep. This helps prevent blood clots.  o You may  return to work once you are authorized by your doctor.     WEIGHT BEARING   Weight bearing as tolerated with assist device (walker, cane, etc) as directed, use it as long as suggested by your surgeon or therapist, typically at least 4-6 weeks.   EXERCISES  Results after joint replacement surgery are often greatly improved when you follow the exercise, range of motion and muscle strengthening exercises prescribed by your doctor. Safety measures are also important to protect the joint from further injury. Any time any of these exercises cause you to have increased pain or swelling, decrease what you are doing until you are comfortable again and then slowly increase them. If you have problems or questions, call your caregiver or physical therapist for advice.   Rehabilitation is important following a joint replacement. After just a few days of immobilization, the muscles of the leg can become weakened and shrink (atrophy).  These exercises are designed to build up the tone and strength of the thigh and leg muscles and to improve motion. Often times heat used for twenty to thirty minutes before working out will loosen up your tissues and help with improving the range of motion but do not use heat for the first two weeks following surgery (sometimes heat can increase post-operative swelling).   These exercises can be done on a training (exercise) mat, on the floor, on a table or on a bed. Use whatever  works the best and is most comfortable for you.    Use music or television while you are exercising so that the exercises are a pleasant break in your day. This will make your life better with the exercises acting as a break in your routine that you can look forward to.   Perform all exercises about fifteen times, three times per day or as directed.  You should exercise both the operative leg and the other leg as well.  Exercises include:    Quad Sets - Tighten up the muscle on the front of the thigh  (Quad) and hold for 5-10 seconds.    Straight Leg Raises - With your knee straight (if you were given a brace, keep it on), lift the leg to 60 degrees, hold for 3 seconds, and slowly lower the leg.  Perform this exercise against resistance later as your leg gets stronger.   Leg Slides: Lying on your back, slowly slide your foot toward your buttocks, bending your knee up off the floor (only go as far as is comfortable). Then slowly slide your foot back down until your leg is flat on the floor again.   Angel Wings: Lying on your back spread your legs to the side as far apart as you can without causing discomfort.   Hamstring Strength:  Lying on your back, push your heel against the floor with your leg straight by tightening up the muscles of your buttocks.  Repeat, but this time bend your knee to a comfortable angle, and push your heel against the floor.  You may put a pillow under the heel to make it more comfortable if necessary.   A rehabilitation program following joint replacement surgery can speed recovery and prevent re-injury in the future due to weakened muscles. Contact your doctor or a physical therapist for more information on knee rehabilitation.    CONSTIPATION  Constipation is defined medically as fewer than three stools per week and severe constipation as less than one stool per week.  Even if you have a regular bowel pattern at home, your normal regimen is likely to be disrupted due to multiple reasons following surgery.  Combination of anesthesia, postoperative narcotics, change in appetite and fluid intake all can affect your bowels.   YOU MUST use at least one of the following options; they are listed in order of increasing strength to get the job done.  They are all available over the counter, and you may need to use some, POSSIBLY even all of these options:    Drink plenty of fluids (prune juice may be helpful) and high fiber foods Colace 100 mg by mouth twice a day  Senokot  for constipation as directed and as needed Dulcolax (bisacodyl), take with full glass of water  Miralax (polyethylene glycol) once or twice a day as needed.  If you have tried all these things and are unable to have a bowel movement in the first 3-4 days after surgery call either your surgeon or your primary doctor.    If you experience loose stools or diarrhea, hold the medications until you stool forms back up.  If your symptoms do not get better within 1 week or if they get worse, check with your doctor.  If you experience "the worst abdominal pain ever" or develop nausea or vomiting, please contact the office immediately for further recommendations for treatment.   ITCHING:  If you experience itching with your medications, try taking only a single pain pill, or  even half a pain pill at a time.  You can also use Benadryl over the counter for itching or also to help with sleep.   TED HOSE STOCKINGS:  Use stockings on both legs until for at least 2 weeks or as directed by physician office. They may be removed at night for sleeping.  MEDICATIONS:  See your medication summary on the After Visit Summary that nursing will review with you.  You may have some home medications which will be placed on hold until you complete the course of blood thinner medication.  It is important for you to complete the blood thinner medication as prescribed.  PRECAUTIONS:  If you experience chest pain or shortness of breath - call 911 immediately for transfer to the hospital emergency department.   If you develop a fever greater that 101 F, purulent drainage from wound, increased redness or drainage from wound, foul odor from the wound/dressing, or calf pain - CONTACT YOUR SURGEON.                                                   FOLLOW-UP APPOINTMENTS:  If you do not already have a post-op appointment, please call the office for an appointment to be seen by your surgeon.  Guidelines for how soon to be seen are  listed in your After Visit Summary, but are typically between 1-4 weeks after surgery.  OTHER INSTRUCTIONS:   Knee Replacement:  Do not place pillow under knee, focus on keeping the knee straight while resting. CPM instructions: 0-90 degrees, 2 hours in the morning, 2 hours in the afternoon, and 2 hours in the evening. Place foam block, curve side up under heel at all times except when in CPM or when walking.  DO NOT modify, tear, cut, or change the foam block in any way.  MAKE SURE YOU:   Understand these instructions.   Get help right away if you are not doing well or get worse.    Thank you for letting us be a part of your medical care team.  It is a privilege we respect greatly.  We hope these instructions will help you stay on track for a fast and full recovery!

## 2018-10-15 NOTE — Discharge Summary (Signed)
Physician Discharge Summary   Patient ID: Raymond Velez MRN: [id number] DOB/AGE: 1955-01-20 63 y.o.  Admit date: 10/09/2018 Discharge date: 10/10/2018  Primary Diagnosis: Left hip osteoarthritis  Admission Diagnoses:  Past Medical History:  Diagnosis Date  . Arthritis   . Hyperlipidemia   . RBBB    Discharge Diagnoses:   Active Problems:   Status post total replacement of left hip  Estimated body mass index is 33.23 kg/m as calculated from the following:   Height as of this encounter:  (1.676 m).   Weight as of this encounter: 93.4 kg.  Procedure:  Procedure(s) (LRB): TOTAL HIP ARTHROPLASTY ANTERIOR APPROACH (Left)   Consults: None  HPI: Raymond Velez is a 63 y.o. male who had   presented to office for evaluation of left hip pain.  Radiographs revealed   progressive degenerative changes with bone-on-bone   articulation of the  hip joint, including subchondral cystic changes and osteophytes.  The patient had painful limited range of   motion significantly affecting their overall quality of life and function.  The patient was failing to    respond to conservative measures including medications and/or injections and activity modification and at this point was ready   to proceed with more definitive measures.  Consent was obtained for   benefit of pain relief.  Specific risks of infection, DVT, component   failure, dislocation, neurovascular injury, and need for revision surgery were reviewed in the office as well discussion of   the anterior versus posterior approach were reviewed.  Laboratory Data: Admission on 10/09/2018, Discharged on 10/10/2018  Component Date Value Ref Range Status  . WBC 10/10/2018 18.5* 4.0 - 10.5 K/uL Final  . RBC 10/10/2018 4.03* 4.22 - 5.81 MIL/uL Final  . Hemoglobin 10/10/2018 12.1* 13.0 - 17.0 g/dL Final  . HCT 13/08/6576 37.9* 39.0 - 52.0 % Final  . MCV 10/10/2018 94.0  80.0 - 100.0 fL Final  . MCH 10/10/2018 30.0  26.0 - 34.0 pg  Final  . MCHC 10/10/2018 31.9  30.0 - 36.0 g/dL Final  . RDW 46/96/2952 12.9  11.5 - 15.5 % Final  . Platelets 10/10/2018 202  150 - 400 K/uL Final  . nRBC 10/10/2018 0.0  0.0 - 0.2 % Final   Performed at [facility], [address], [place], [place] [address]  . Sodium 10/10/2018 134* 135 - 145 mmol/L Final  . Potassium 10/10/2018 4.3  3.5 - 5.1 mmol/L Final  . Chloride 10/10/2018 101  98 - 111 mmol/L Final  . CO2 10/10/2018 26  22 - 32 mmol/L Final  . Glucose, Bld 10/10/2018 158* 70 - 99 mg/dL Final  . BUN 44/01/270 13  8 - 23 mg/dL Final  . Creatinine, Ser 10/10/2018 0.66  0.61 - 1.24 mg/dL Final  . Calcium 53/66/4403 8.7* 8.9 - 10.3 mg/dL Final  . GFR calc non Af Amer 10/10/2018 >60  >60 mL/min Final  . GFR calc Af Amer 10/10/2018 >60  >60 mL/min Final  . Anion gap 10/10/2018 7  5 - 15 Final   Performed at [facility], [address], [place], [place] [address]  Hospital Outpatient Visit on 10/05/2018  Component Date Value Ref Range Status  . SARS-CoV-2, NAA 10/05/2018 NOT DETECTED  NOT DETECTED Final   Comment: (NOTE) This nucleic acid amplification test was developed and its performance characteristics determined by [employer]. Nucleic acid amplification tests include PCR and TMA. This test has not been FDA cleared or approved. This test  has been authorized by FDA under an Emergency Use Authorization (EUA). This test is only authorized for the duration of time the declaration that circumstances exist justifying the authorization of the emergency use of in vitro diagnostic tests for detection of SARS-CoV-2 virus and/or diagnosis of COVID-19 infection under section 564(b)(1) of the Act, 21 U.S.C. 767HAL-9(F) (1), unless the authorization is terminated or revoked sooner. When diagnostic testing is negative, the possibility of a false negative result should be considered in the context of a patient's recent exposures and the  presence of clinical signs and symptoms consistent with COVID-19. An individual without symptoms of COVID- 19 and who is not shedding SARS-CoV-2 vi                          rus would expect to have a negative (not detected) result in this assay. Performed At: [facility] [place], [place] [id number] Rush Farmer MD ZH:[id number]   . Coronavirus Source 10/05/2018 NASOPHARYNGEAL   Final   Performed at [facility], [place] [address], [place], [place] [address]  Hospital Outpatient Visit on 10/01/2018  Component Date Value Ref Range Status  . MRSA, PCR 10/01/2018 POSITIVE* NEGATIVE Final   Comment: RESULT CALLED TO, READ BACK BY AND VERIFIED WITH: Naaman Plummer [id number] @ [address]   . Staphylococcus aureus 10/01/2018 POSITIVE* NEGATIVE Final   Comment: (NOTE) The Xpert SA Assay (FDA approved for NASAL specimens in patients 65 years of age and older), is one component of a comprehensive surveillance program. It is not intended to diagnose infection nor to guide or monitor treatment. Performed at [facility], [place] [address], [place], [place] [address]   . ABO/RH(D) 10/01/2018 B NEG   Final  . Antibody Screen 10/01/2018 NEG   Final  . Sample Expiration 10/01/2018 10/12/2018,2359   Final  . Extend sample reason 10/01/2018    Final                   Value:NO TRANSFUSIONS OR PREGNANCY IN THE PAST 3 MONTHS Performed at [place] [address] [place], [place] [address]   . WBC 10/01/2018 11.5* 4.0 - 10.5 K/uL Final  . RBC 10/01/2018 5.04  4.22 - 5.81 MIL/uL Final  . Hemoglobin 10/01/2018 15.2  13.0 - 17.0 g/dL Final  . HCT 10/01/2018 46.5  39.0 - 52.0 % Final  . MCV 10/01/2018 92.3  80.0 - 100.0 fL Final  . MCH 10/01/2018 30.2  26.0 - 34.0 pg Final  . MCHC 10/01/2018 32.7  30.0 - 36.0 g/dL Final  . RDW 10/01/2018 13.3  11.5 - 15.5 % Final  . Platelets 10/01/2018 210  150 - 400 K/uL Final  . nRBC  10/01/2018 0.0  0.0 - 0.2 % Final   Performed at [facility], [place] [address], [place], [place] [address]  . ABO/RH(D) 10/01/2018    Final                   Value:B NEG Performed at [facility], [place] [address], [place],  [address]      X-Rays:Dg C-arm 1-60 Min-no Report  Result Date: 10/09/2018 Fluoroscopy was utilized by the requesting physician.  No radiographic interpretation.   Dg Hip Operative Unilat W Or W/o Pelvis Left  Result Date: 10/09/2018 CLINICAL DATA:  Left hip replacement EXAM: OPERATIVE LEFT HIP (WITH PELVIS IF PERFORMED) 4 VIEWS TECHNIQUE:  Fluoroscopic spot image(s) were submitted for interpretation post-operatively. COMPARISON:  None. FINDINGS: Multiple intraoperative spot images demonstrate changes of left hip replacement. Normal AP alignment. No hardware bony complicating feature. IMPRESSION: Left hip replacement.  No visible complicating feature. Electronically Signed   By: Charlett Nose M.D.   On: 10/09/2018 10:41    EKG: Orders placed or performed in visit on 10/03/18  . EKG 12-Lead     Hospital Course: Raymond Velez is a 63 y.o. who was admitted to [facility]. They were brought to the operating room on 10/09/2018 and underwent Procedure(s): TOTAL HIP ARTHROPLASTY ANTERIOR APPROACH.  Patient tolerated the procedure well and was later transferred to the recovery room and then to the orthopaedic floor for postoperative care. They were given PO and IV analgesics for pain control following their surgery. They were given 24 hours of postoperative antibiotics of  Anti-infectives (From admission, onward)   Start     Dose/Rate Route Frequency Ordered Stop   10/09/18 1500  ceFAZolin (ANCEF) IVPB 1 g/50 mL premix     1 g 100 mL/hr over 30 Minutes Intravenous Every 6 hours 10/09/18 1205 10/09/18 2123   10/09/18 1300  ceFAZolin (ANCEF) IVPB 1 g/50 mL premix  Status:  Discontinued     1 g 100 mL/hr over 30 Minutes  Intravenous Every 6 hours 10/09/18 1157 10/09/18 1205   10/09/18 0730  vancomycin (VANCOCIN) IVPB 1000 mg/200 mL premix     1,000 mg 200 mL/hr over 60 Minutes Intravenous  Once 10/09/18 0645 10/09/18 1017   10/09/18 0700  ceFAZolin (ANCEF) IVPB 2g/100 mL premix     2 g 200 mL/hr over 30 Minutes Intravenous On call to O.R. 10/09/18 6195 10/09/18 0917     and started on DVT prophylaxis in the form of Aspirin.   PT and OT were ordered for total joint protocol. Discharge planning consulted to help with postop disposition and equipment needs.  Patient had a good night on the evening of surgery. They started to get up OOB with therapy on POD #0. Pt was seen during rounds and was ready to go home pending progress with therapy.  He worked with therapy on POD #1 and was meeting his goals. Pt was discharged to home later that day in stable condition.  Diet: Regular diet Activity: WBAT Follow-up: in 2 weeks Disposition: Home Discharged Condition: good   Discharge Instructions    Call MD / Call 911   Complete by: As directed    If you experience chest pain or shortness of breath, CALL 911 and be transported to the hospital emergency room.  If you develope a fever above 101 F, pus (white drainage) or increased drainage or redness at the wound, or calf pain, call your surgeon's office.   Change dressing   Complete by: As directed    Maintain surgical dressing until follow up in the clinic. If the edges start to pull up, may reinforce with tape. If the dressing is no longer working, may remove and cover with gauze and tape, but must keep the area dry and clean.  Call with any questions or concerns.   Constipation Prevention   Complete by: As directed    Drink plenty of fluids.  Prune juice may be helpful.  You may use a stool softener, such as Colace (over the counter) 100 mg twice a day.  Use MiraLax (over the counter) for constipation as needed.   Diet - low sodium heart healthy   Complete by:  As  directed    Discharge instructions   Complete by: As directed    Maintain surgical dressing until follow up in the clinic. If the edges start to pull up, may reinforce with tape. If the dressing is no longer working, may remove and cover with gauze and tape, but must keep the area dry and clean.  [facility]. Call with any questions or concerns.   Increase activity slowly as tolerated   Complete by: As directed    Weight bearing as tolerated with assist device (walker, cane, etc) as directed, use it as long as suggested by your surgeon or therapist, typically at least 4-6 weeks.   TED hose   Complete by: As directed    Use stockings (TED hose) for 2 weeks on both leg(s).  You may remove them at night for sleeping.     Allergies as of 10/10/2018   No Known Allergies     Medication List    STOP taking these medications   ibuprofen 200 MG tablet Commonly known as: ADVIL   Pain Relief 500 MG tablet Generic drug: acetaminophen     TAKE these medications   aspirin 81 MG chewable tablet Chew 1 tablet (81 mg total) by mouth 2 (two) times daily for 28 days. Then discontinue aspirin.   atorvastatin 20 MG tablet Commonly known as: LIPITOR daily.   Chantix Starting Month Pak 0.5 MG X 11 & 1 MG X 42 tablet Generic drug: varenicline Has not picked up yet   ferrous sulfate 325 (65 FE) MG tablet Take 1 tablet (325 mg total) by mouth 2 (two) times daily with a meal for 14 days.   HYDROcodone-acetaminophen 7.5-325 MG tablet Commonly known as: NORCO Take 1-2 tablets by mouth every 6 (six) hours as needed for severe pain (pain score 7-10).   methocarbamol 500 MG tablet Commonly known as: ROBAXIN Take 1 tablet (500 mg total) by mouth every 6 (six) hours as needed for muscle spasms.   polyethylene glycol 17 g packet Commonly known as: MIRALAX / GLYCOLAX Take 17 g by mouth daily as needed for mild constipation.   SENNOSIDES PO Take 8.5 mg by mouth 3  (three) times daily with meals. Swiss Kriss Herbal Laxative Tablets  Finely Powdered Sun-Dried Leaves of SennaInactive Ingredients: Finely Powdered Strawberry Leaves, Peach Leaves, Anise Seed, Caraway Seed, Hibiscus and Calendula Flowers (for their flavoring), Carminative Principles            Discharge Care Instructions  (From admission, onward)         Start     Ordered   10/10/18 0000  Change dressing    Comments: Maintain surgical dressing until follow up in the clinic. If the edges start to pull up, may reinforce with tape. If the dressing is no longer working, may remove and cover with gauze and tape, but must keep the area dry and clean.  Call with any questions or concerns.   10/10/18 0813         Follow-up Information    Durene Romanslin, Matthew, MD. Schedule an appointment as soon as possible for a visit in 2 week(s).   Specialty: Orthopedic Surgery Contact information: [address] [place] [address] [place] [id number] [phone]           Signed: Dennie Bibleshley Zali Kamaka, PA-C Orthopedic Surgery 10/15/2018, 10:53 AM

## 2018-12-18 ENCOUNTER — Other Ambulatory Visit: Payer: Self-pay | Admitting: Family Medicine

## 2018-12-18 DIAGNOSIS — H5509 Other forms of nystagmus: Secondary | ICD-10-CM

## 2018-12-25 ENCOUNTER — Ambulatory Visit
Admission: RE | Admit: 2018-12-25 | Discharge: 2018-12-25 | Disposition: A | Discharge status: Home / Self Care | Payer: PRIVATE HEALTH INSURANCE | Source: Ambulatory Visit | Attending: Family Medicine | Admitting: Family Medicine

## 2018-12-25 DIAGNOSIS — H5509 Other forms of nystagmus: Secondary | ICD-10-CM

## 2018-12-25 MED ORDER — IOPAMIDOL (ISOVUE-300) INJECTION 61%
75.0000 mL | Freq: Once | INTRAVENOUS | Status: AC | PRN
  Administered 2018-12-25: 16:00:00 75 mL via INTRAVENOUS

## 2019-02-19 ENCOUNTER — Ambulatory Visit: Payer: PRIVATE HEALTH INSURANCE | Admitting: Neurology

## 2020-06-12 IMAGING — RF DG HIP (WITH PELVIS) OPERATIVE*L*
1 series · 4 of 4 positions shown · non-contrast
Comparison: None.

CLINICAL DATA: Left hip replacement

EXAM:
OPERATIVE LEFT HIP (WITH PELVIS IF PERFORMED) 4 VIEWS
TECHNIQUE: Fluoroscopic spot image(s) were submitted for interpretation
post-operatively.

[Series 1: run · 4 of 4 slices shown]
[im 1/4]
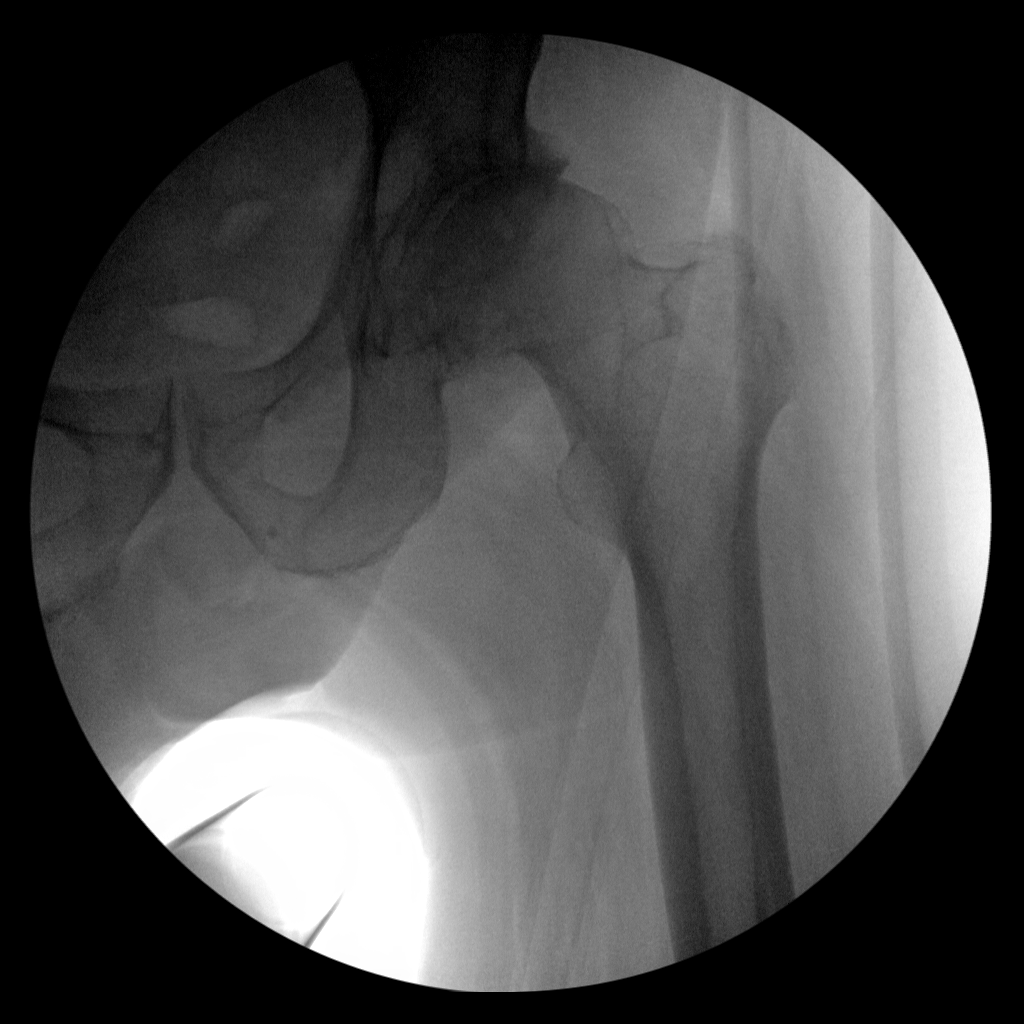
[im 2/4]
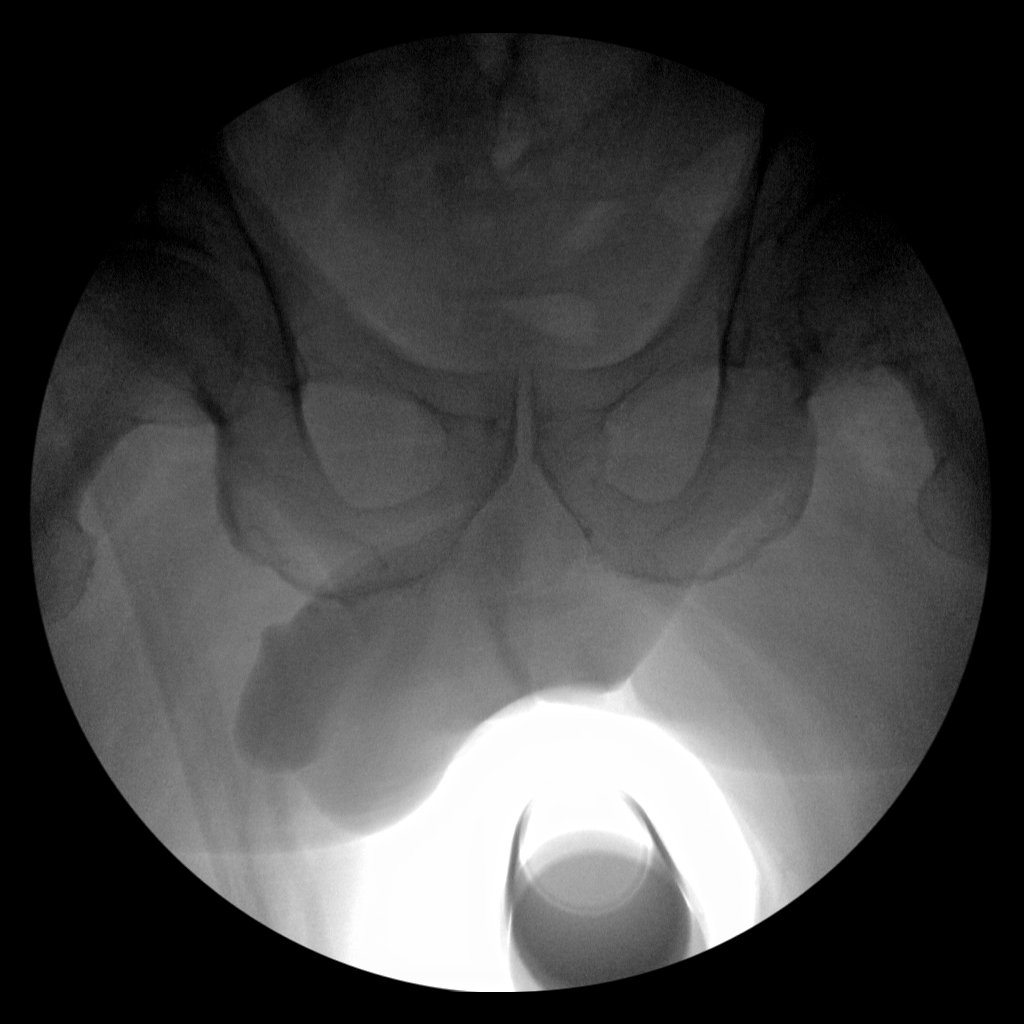
[im 3/4]
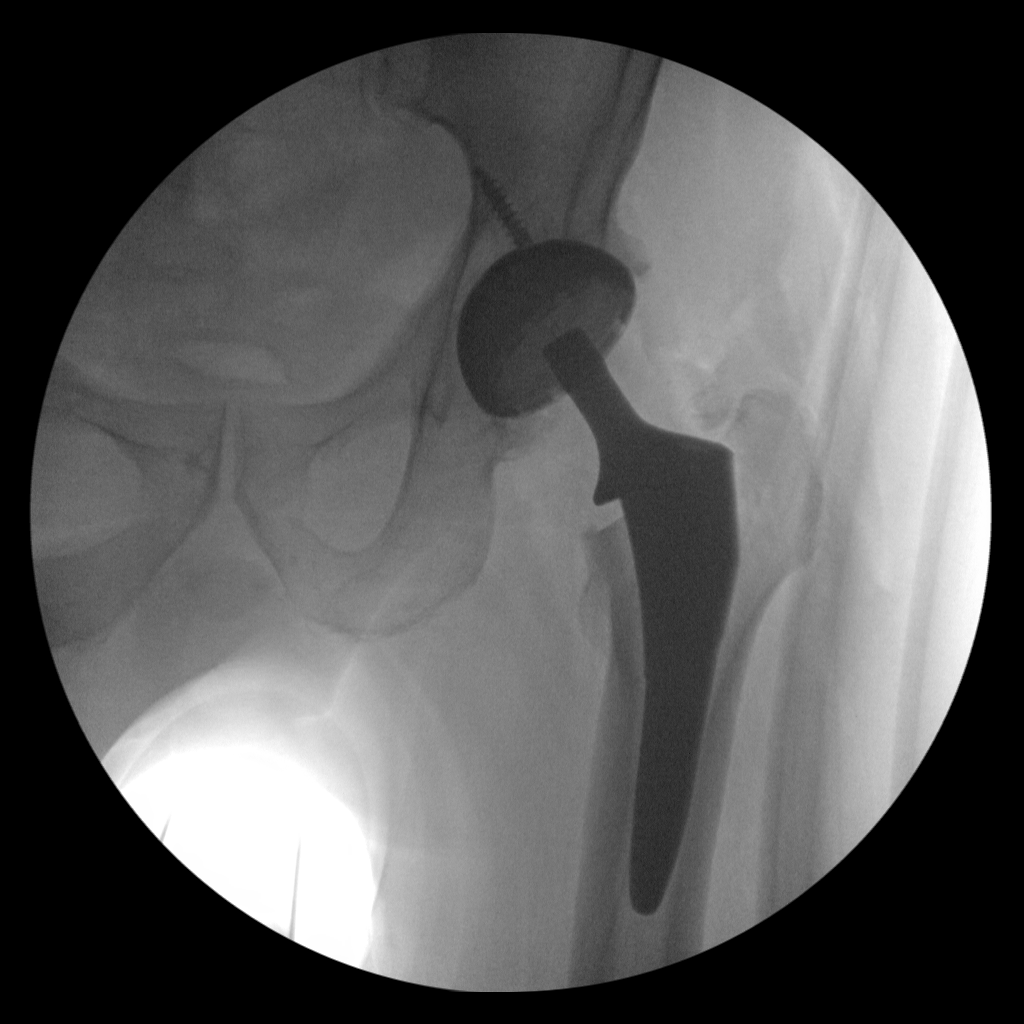
[im 4/4]
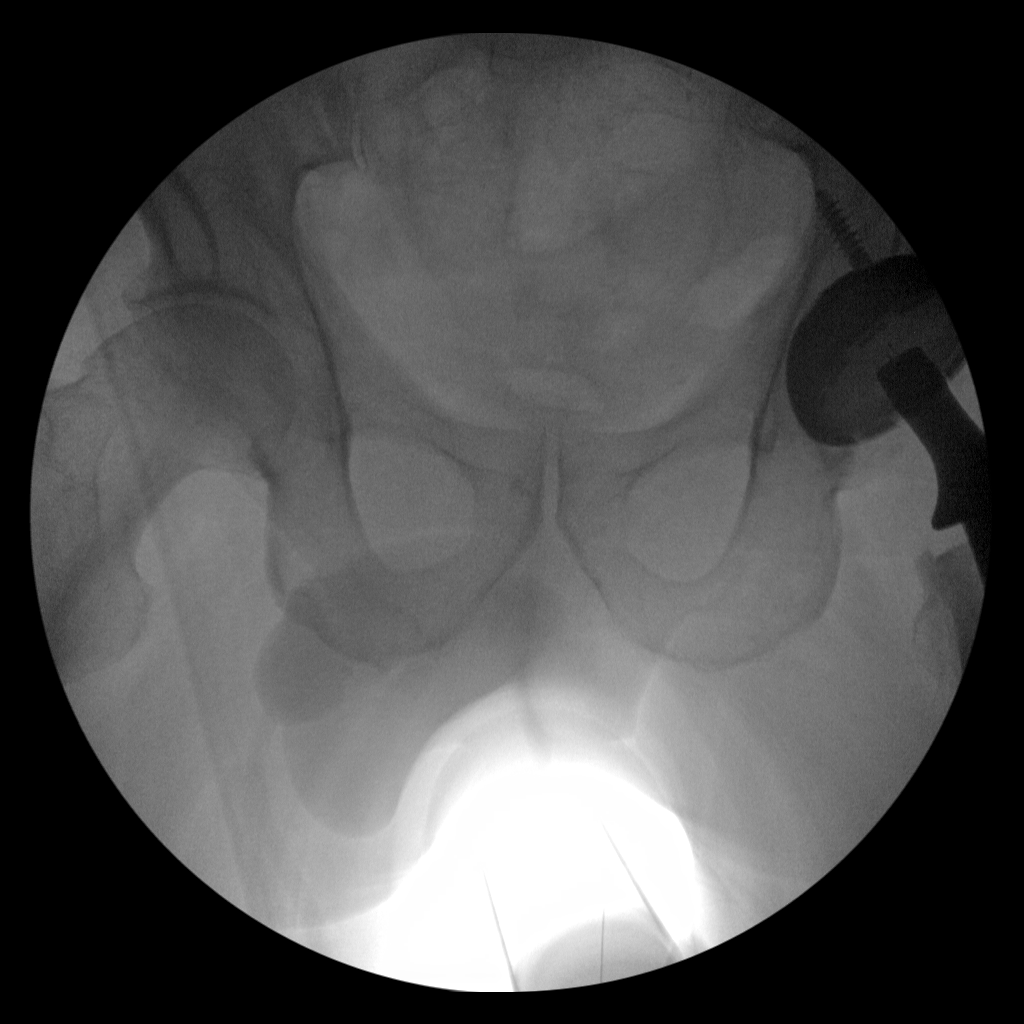

[4 of 4 positions shown; findings below may reference images not displayed]

FINDINGS: Multiple intraoperative spot images demonstrate changes of left hip
replacement. Normal AP alignment. No hardware bony complicating
feature.
IMPRESSION: Left hip replacement.  No visible complicating feature.

## 2020-08-28 IMAGING — CT CT HEAD WO/W CM
4 of 5 series · 16 of 47 positions shown, 18 images · IV contrast (iopamidol)
Comparison: None.

CLINICAL DATA: Vertical nystagmus.

EXAM:
CT HEAD WITHOUT AND WITH CONTRAST
TECHNIQUE: Contiguous axial images were obtained from the base of the skull
through the vertex without and with intravenous contrast
CONTRAST:  75mL [id number] IOPAMIDOL (I6H14Z-XKK) INJECTION 61%

[Series 2: brain 5.00 hr40 s3 ibhc · axial · 0.43mm/px · z∈[-593,-463]mm · 8 of 34 slices shown, 10 images]
[im 4/34  brain]
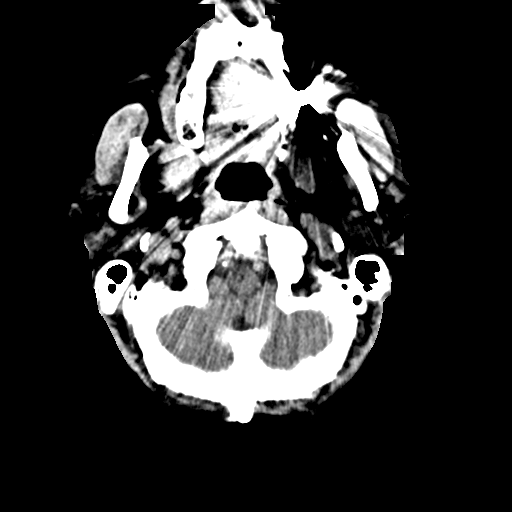
[im 4/34  bone]
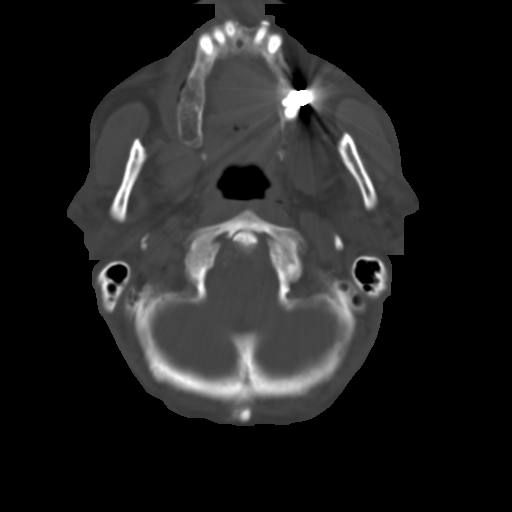
[im 8/34  brain]
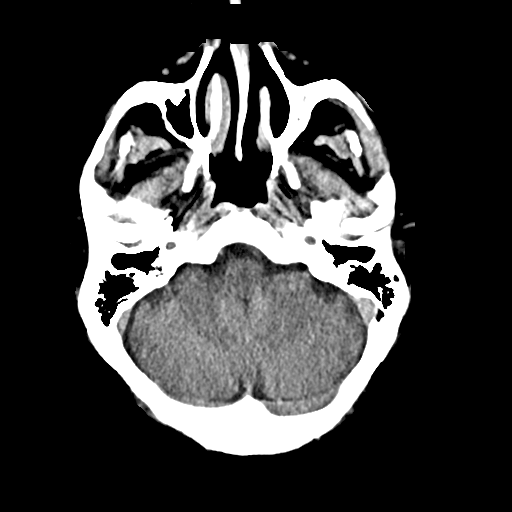
[im 12/34  brain]
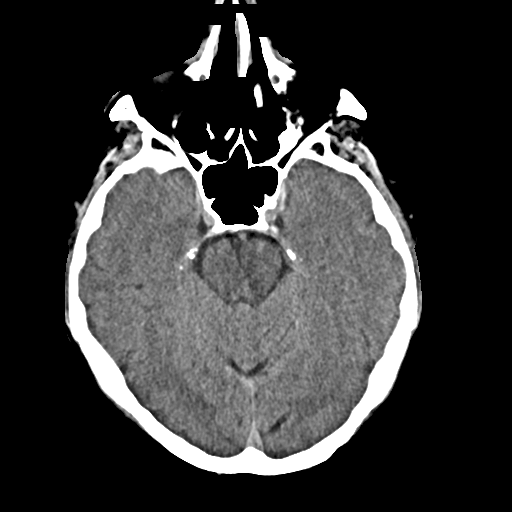
[im 15/34  brain]
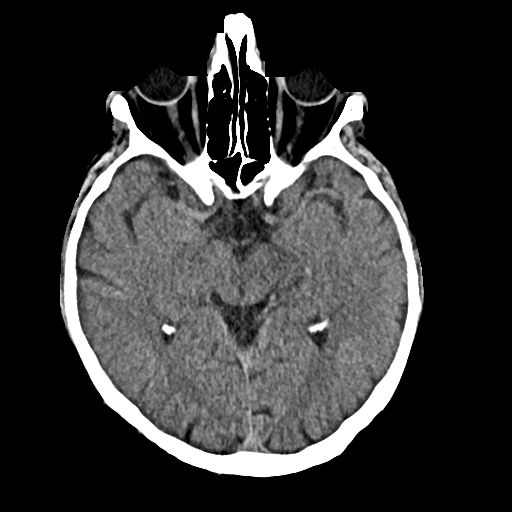
[im 19/34  brain]
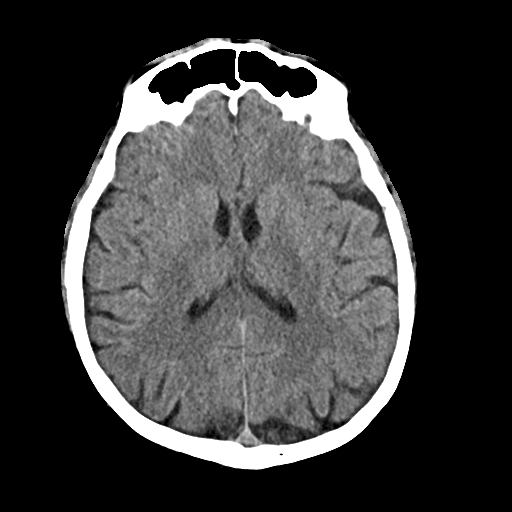
[im 19/34  bone]
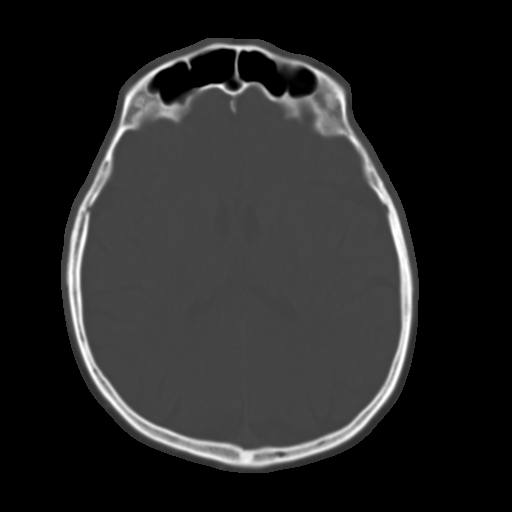
[im 23/34  brain]
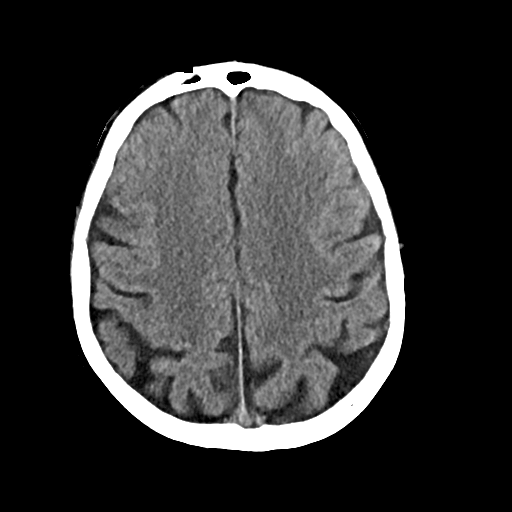
[im 26/34  brain]
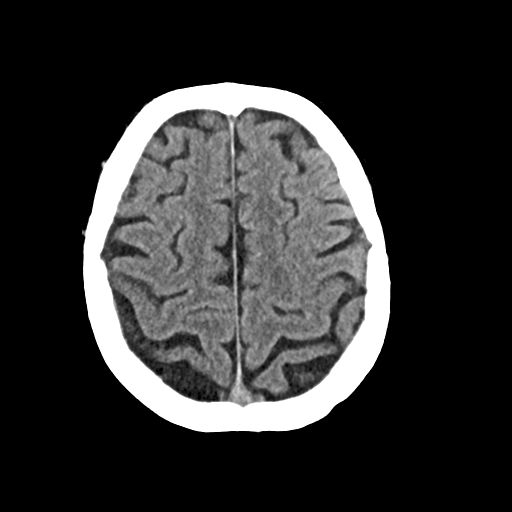
[im 30/34  brain]
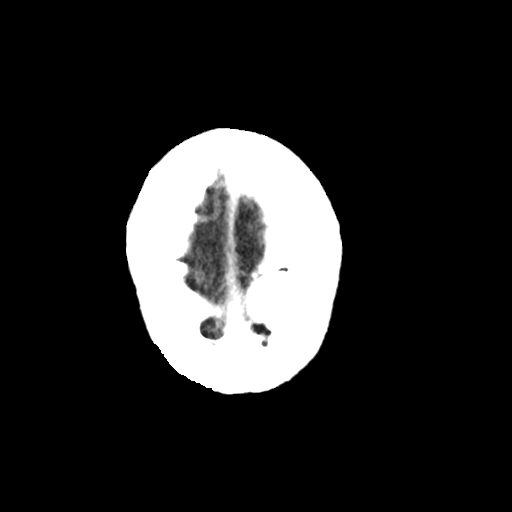

[Series 3: brain 5.00 hr60 s3 axial · axial · 0.43mm/px · z∈[-593,-573]mm · 2 of 34 slices shown]
[im 4/34  brain]
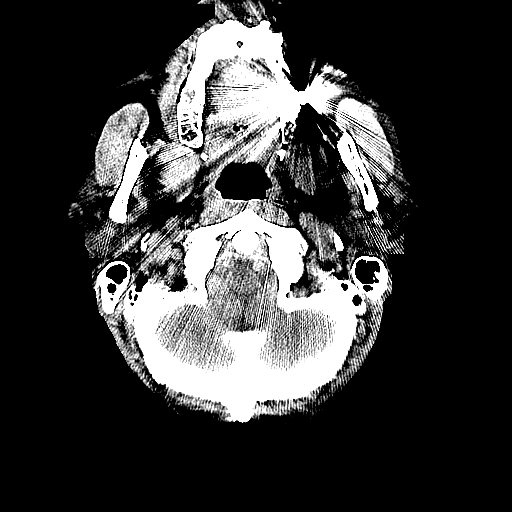
[im 8/34  brain]
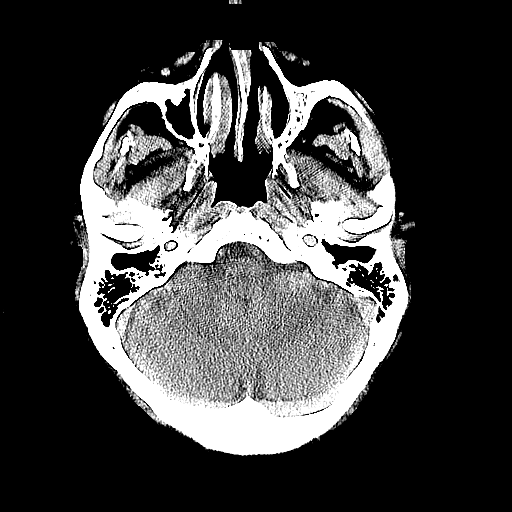

[Series 5: brain 3.00 hr40 s3 cor ibhc · coronal · 0.33mm/px · 3 of 73 slices shown]
[im 25/73  brain]
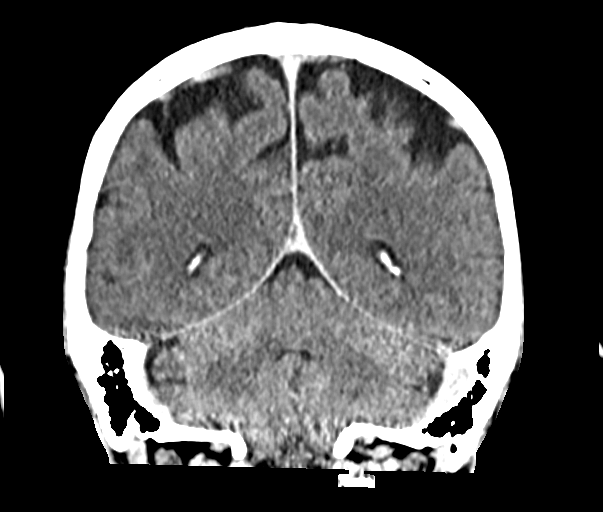
[im 33/73  brain]
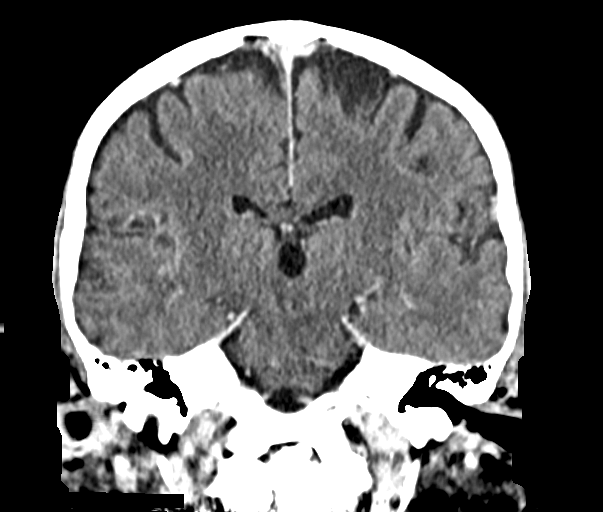
[im 41/73  brain]
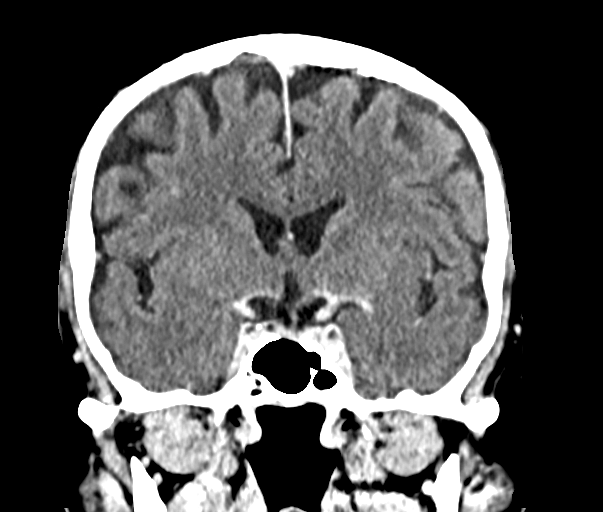

[Series 7: brain 3.00 hr40 s3 sag ibhc · sagittal · 0.33mm/px · 3 of 65 slices shown]
[im 22/65  brain]
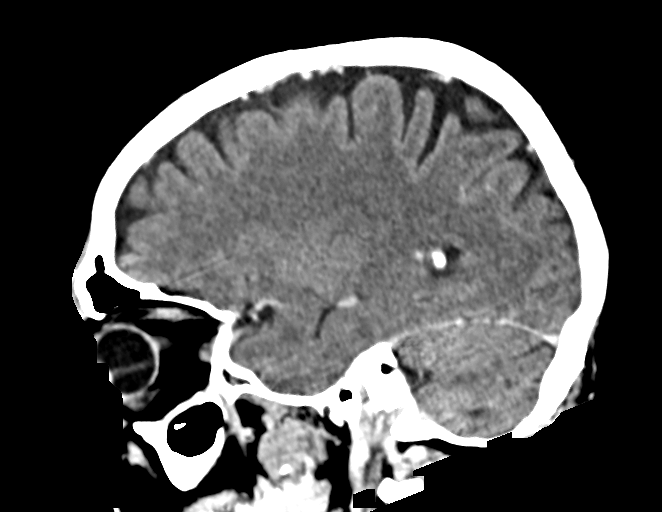
[im 33/65  brain]
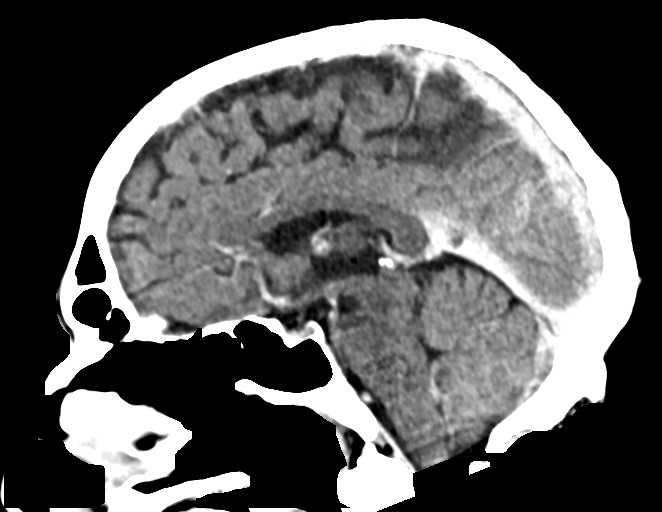
[im 43/65  brain]
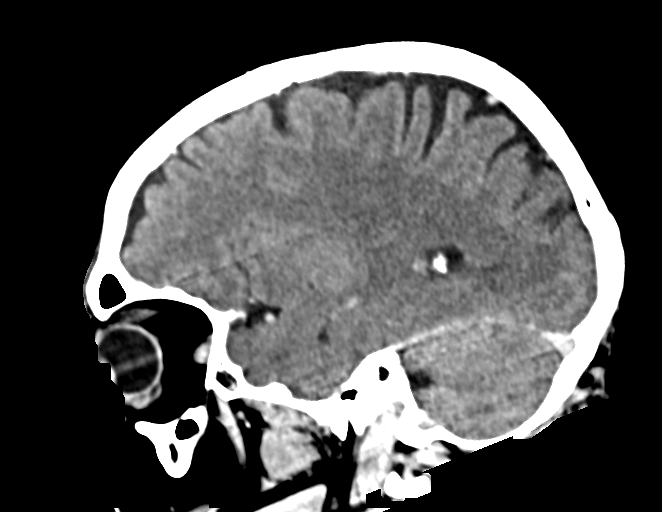

[16 of 47 positions shown; findings below may reference images not displayed]

FINDINGS: Brain: There is no evidence of acute infarct, intracranial
hemorrhage, mass, midline shift, or extra-axial fluid collection.
The ventricles and sulci are normal. A tiny chronic infarct is
questioned in the right cerebellum (series 2, image 6). No abnormal
enhancement is identified.

Vascular: The major dural venous sinuses and large arteries at the
base of the brain are grossly patent.

Skull: No fracture or focal osseous lesion.

Sinuses/Orbits: Small left maxillary sinus with mild mucosal
thickening. Clear mastoid air cells. Unremarkable orbits.

Other: None.
IMPRESSION: 1. No evidence of acute intracranial abnormality or mass.
2. Possible tiny chronic right cerebellar infarct.

## 2022-09-05 ENCOUNTER — Other Ambulatory Visit: Payer: Self-pay | Admitting: Family Medicine

## 2022-09-05 DIAGNOSIS — Z136 Encounter for screening for cardiovascular disorders: Secondary | ICD-10-CM

## 2022-09-05 DIAGNOSIS — Z122 Encounter for screening for malignant neoplasm of respiratory organs: Secondary | ICD-10-CM

## 2022-09-09 ENCOUNTER — Ambulatory Visit
Admission: RE | Admit: 2022-09-09 | Discharge: 2022-09-09 | Disposition: A | Discharge status: Home / Self Care | Payer: Medicare Other | Source: Ambulatory Visit | Attending: Family Medicine

## 2022-09-09 DIAGNOSIS — Z136 Encounter for screening for cardiovascular disorders: Secondary | ICD-10-CM

## 2022-09-23 ENCOUNTER — Other Ambulatory Visit: Payer: PRIVATE HEALTH INSURANCE

## 2022-10-07 ENCOUNTER — Ambulatory Visit
Admission: RE | Admit: 2022-10-07 | Discharge: 2022-10-07 | Disposition: A | Discharge status: Home / Self Care | Payer: Medicare Other | Source: Ambulatory Visit | Attending: Family Medicine

## 2022-10-07 DIAGNOSIS — Z122 Encounter for screening for malignant neoplasm of respiratory organs: Secondary | ICD-10-CM

## 2022-10-28 ENCOUNTER — Telehealth: Payer: Self-pay | Admitting: Emergency Medicine

## 2022-10-28 NOTE — Telephone Encounter (Signed)
Checking to see if any open dates with BI, RB or APP to work this patient in sooner for new nodule consult than 11/1. If not, then keep his existing visit please.

## 2022-10-31 NOTE — Telephone Encounter (Signed)
Thank you :)

## 2022-11-01 ENCOUNTER — Encounter: Payer: Self-pay | Admitting: Emergency Medicine

## 2022-11-02 ENCOUNTER — Telehealth: Payer: Self-pay | Admitting: *Deleted

## 2022-11-02 NOTE — Telephone Encounter (Signed)
Called and spoke with patient's daughter, Raymond Velez, who works in pathology and the patient had requested that we speak with her as he does not understand the medical terminology.  I explained that Dr. Delton Coombes is completely booked up, however, would do a virtual visit on 11/11/22 at 1 pm to discuss the scan and plan going forward to evaluate it.  She was concerned about waiting a week as it was 3 weeks before he received the results of the scan and was concerned that it would be a wait until something was done to evaluate the mass. Dr. Delton Coombes asked that I share with her that his plan was to bronch him on 11/3 or 11/4 after discussing the scan at the virtual visit.  She was relieved to hear that there would not be a long wait to have something done.  She verbalized understanding and will do the virtual visit on 11/1 with Dr. Delton Coombes.  Nothing further needed.

## 2022-11-03 ENCOUNTER — Other Ambulatory Visit: Payer: Self-pay | Admitting: Emergency Medicine

## 2022-11-03 DIAGNOSIS — R918 Other nonspecific abnormal finding of lung field: Secondary | ICD-10-CM

## 2022-11-03 NOTE — Progress Notes (Signed)
Bronchoscopy request orders placed

## 2022-11-04 ENCOUNTER — Ambulatory Visit
Admission: RE | Admit: 2022-11-04 | Discharge: 2022-11-04 | Disposition: A | Discharge status: Home / Self Care | Payer: Medicare Other | Source: Ambulatory Visit | Attending: Emergency Medicine | Admitting: Emergency Medicine

## 2022-11-04 ENCOUNTER — Encounter: Payer: Self-pay | Admitting: Emergency Medicine

## 2022-11-04 DIAGNOSIS — R918 Other nonspecific abnormal finding of lung field: Secondary | ICD-10-CM

## 2022-11-08 ENCOUNTER — Telehealth: Payer: Self-pay | Admitting: Emergency Medicine

## 2022-11-08 NOTE — Telephone Encounter (Signed)
D[facility] dropped off dic. It is placed in Dr.Byrum's box.

## 2022-11-11 ENCOUNTER — Other Ambulatory Visit: Payer: Self-pay

## 2022-11-11 ENCOUNTER — Telehealth (INDEPENDENT_AMBULATORY_CARE_PROVIDER_SITE_OTHER): Payer: Medicare Other | Admitting: Emergency Medicine

## 2022-11-11 ENCOUNTER — Encounter: Payer: Self-pay | Admitting: Emergency Medicine

## 2022-11-11 ENCOUNTER — Encounter (HOSPITAL_COMMUNITY): Payer: Self-pay | Admitting: Emergency Medicine

## 2022-11-11 DIAGNOSIS — R918 Other nonspecific abnormal finding of lung field: Secondary | ICD-10-CM

## 2022-11-11 DIAGNOSIS — F17210 Nicotine dependence, cigarettes, uncomplicated: Secondary | ICD-10-CM | POA: Diagnosis not present

## 2022-11-11 DIAGNOSIS — R911 Solitary pulmonary nodule: Secondary | ICD-10-CM | POA: Diagnosis not present

## 2022-11-11 DIAGNOSIS — F17200 Nicotine dependence, unspecified, uncomplicated: Secondary | ICD-10-CM

## 2022-11-11 NOTE — H&P (View-Only) (Signed)
Virtual Visit via Video Note  I connected with Raymond Velez on 11/11/22 at  1:00 PM EDT by a video enabled telemedicine application and verified that I am speaking with the correct person using two identifiers.  Location: Patient: Home Provider: Office Patient's daughter: Available by phone. Ronnell Freshwater [phone]   I discussed the limitations of evaluation and management by telemedicine and the availability of in person appointments. The patient expressed understanding and agreed to proceed.   Subjective:    Patient ID: Raymond Velez, male    DOB: April 27, 1955, 67 y.o.   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 The scan was initially recommended by his primary care physician due to concerns about the patient's breathing, which was perceived as heavier than normal. The patient reported a single episode of breathing difficulty two years ago during a trip to [place], but no recurrent or persistent respiratory issues since then. He also reported occasional coughing, but denied any productive cough or hemoptysis. The patient noted occasional chest discomfort in extreme heat, but did not describe it as severe.  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 He denied any known exposure to tuberculosis or asbestos. The patient had a chest X-ray in [place] in July of the current year, which did not reveal the mass detected on the recent CT scan. The patient also received multiple vaccinations around the same time, including a COVID-19 vaccine, a flu shot, and a pneumococcal vaccine, but did not report any significant adverse reactions.   Review of Systems As per HPI  Past Medical History:  Diagnosis Date   Arthritis    Hyperlipidemia    RBBB      No  family history on file.   Social History   Socioeconomic History   Marital status: Married    Spouse name: Not on file   Number of children: 2   Years of education: Not on file   Highest education level: Not on file  Occupational History   Not on file  Tobacco Use   Smoking status: Every Day    Current packs/day: 1.50    Types: Cigarettes   Smokeless tobacco: Never  Vaping Use   Vaping status: Never Used  Substance and Sexual Activity   Alcohol use: Yes    Alcohol/week: 0.0 standard drinks of alcohol    Comment: beer occasionally   Drug use: No   Sexual activity: Not on file  Other Topics Concern   Not on file  Social History Narrative   Not on file   Social Determinants of Health   Financial Resource Strain: Not on file  Food Insecurity: Not on file  Transportation Needs: Not on file  Physical Activity: Not on file  Stress: Not on file  Social Connections: Unknown (08/30/2021)   Received from [facility]   Social Network    Social Network: Not on file  Intimate Partner Violence: Unknown (08/30/2021)   Received from [facility]   HITS    Physically Hurt: Not on file    Insult or Talk Down To: Not on file    Threaten Physical Harm: Not on file    Scream or Curse: Not on file   From [place], has also lived in [place] Has been a truck Hospital doctor, 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     CHANTIX STARTING MONTH PAK 0.5 MG X 11 & 1 MG X 42 tablet Has not picked up yet     ferrous sulfate 325 (65 FE) MG tablet Take 1 tablet (325 mg total) by mouth 2 (two) times daily with a meal for 14 days. 28 tablet 0   HYDROcodone-acetaminophen (NORCO) 7.5-325 MG tablet Take 1-2 tablets by mouth every 6 (six) hours as needed for severe pain (pain score 7-10). 56 tablet 0   methocarbamol (ROBAXIN) 500 MG tablet Take 1 tablet (500 mg  total) by mouth every 6 (six) hours as needed for muscle spasms. 40 tablet 0   polyethylene glycol (MIRALAX / GLYCOLAX) 17 g packet Take 17 g by mouth daily as needed for mild constipation. 14 each 0   SENNOSIDES PO Take 8.5 mg by mouth 3 (three) times daily with meals. Swiss Kriss Herbal Laxative Tablets  Finely Powdered Sun-Dried Leaves of SennaInactive Ingredients: Finely Powdered Strawberry Leaves, Peach Leaves, Anise Seed, Caraway Seed, Hibiscus and Calendula Flowers (for their flavoring), Carminative Principles     No facility-administered medications prior to visit.        Objective:   Radiology: CT scan of the chest performed 11/04/2022 reviewed by me shows a 5 mm prevascular node, 4 mm benign calcified granuloma in the right middle lobe, centrilobular and paraseptal emphysema.  There is a 5.4 x 4.2 x 3.7 cm left upper lobe suprahilar mass with surrounding postobstructive changes and interstitial situation and nodularity that could reflect lymphangitic spread and satellite nodularity.  Also increased bandlike atelectasis in the anterior left upper lobe     Assessment & Plan:  Mass of upper lobe of left lung Lung Mass CT scan shows a solid mass-like lesion in the upper part of the left lung, with surrounding haze areas that could represent inflammation or pneumonia. The mass is concerning for lung cancer. The patient has a history of heavy smoking for 35 years. No symptoms of pneumonia or post-obstruction pneumonia. -Perform bronchoscopy under anesthesia on 11/14/2022 to obtain biopsies for pathology. -Order PET scan for more definitive staging. -Refer to oncology for further management pending biopsy results.  Tobacco use disorder Consider PFTs going forward to establish degree of obstructive lung disease.  There is emphysema on his CT chest and he does have some cough, no physical limitations or functional limitation.  Denies any dyspnea.  Occasional chest discomfort.  Will need to  work hard on cessation    I discussed the assessment and treatment plan with the patient. The patient was provided an opportunity to ask questions and all were answered. The patient agreed with the plan and demonstrated an understanding of the instructions.   The patient was advised to call back or seek an in-person evaluation if the symptoms worsen or if the condition fails to improve as anticipated.  I provided 35 minutes of non-face-to-face time during this encounter.   Levy Pupa, MD, PhD 11/11/2022, 1:33 PM [facility] [phone] or if no answer before 7:00PM call [phone] For any issues after 7:00PM please call eLink [phone]

## 2022-11-11 NOTE — Patient Instructions (Signed)
VISIT SUMMARY:  During your virtual consultation, we discussed the findings from your recent CT scan, which showed a concerning mass in your left lung. We also reviewed your history of smoking, occasional cough, snoring, chest pain, and occupational exposure to respiratory hazards. Additionally, we confirmed your recent vaccinations.  YOUR PLAN:  -LUNG MASS: A solid mass was found in the upper part of your left lung, which could be lung cancer. We will perform a bronchoscopy under anesthesia on 11/14/2022 to take tissue samples for further analysis. A PET scan will also be ordered to get more detailed information. You will be referred to an oncologist for further management based on the biopsy results.  -CHRONIC COUGH: You have a chronic cough, likely related to your long-term smoking history. We will continue to monitor your symptoms.  -CHEST PAIN: You experience occasional chest pain during extreme heat or exertion. We will continue to monitor your symptoms.  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 This information will be noted for future reference.  -VACCINATION STATUS: You have received flu, COVID, and Prevnar vaccines. No further action is needed at this time.  INSTRUCTIONS:  We will call you with the pathology results 2-3 business days after your bronchoscopy. Your follow-up appointment is scheduled for 11/22/2022.

## 2022-11-11 NOTE — Assessment & Plan Note (Addendum)
Consider PFTs going forward to establish degree of obstructive lung disease.  There is emphysema on his CT chest and he does have some cough, no physical limitations or functional limitation.  Denies any dyspnea.  Occasional chest discomfort.  Will need to work hard on cessation

## 2022-11-11 NOTE — Progress Notes (Signed)
SDW CALL  Patient was given pre-op instructions over the phone. The opportunity was given for the patient to ask questions. No further questions asked. Patient verbalized understanding of instructions given.   PCP - Juanda Chance Cardiologist - Sonda Rumble  PPM/ICD - denies Device Orders -  Rep Notified -   Chest x-ray - 11/04/22-CT chest EKG - 09/02/22-requested tracing;DOS if results not received Stress Test - 10/23/15 ECHO - 11/03/15 Cardiac Cath - denies  Sleep Study - denies CPAP -   Fasting Blood Sugar - na Checks Blood Sugar _____ times a day  Blood Thinner Instructions:na Aspirin Instructions:na  ERAS Protcol -no PRE-SURGERY Ensure or G2-   COVID TEST- no   Anesthesia review: no  Patient denies shortness of breath, fever, cough and chest pain over the phone call    Surgical Instructions    Your procedure is scheduled on November 4  Report to [facility] Main Entrance "A" at 1015 A.M., then check in with the Admitting office.  Call this number if you have problems the morning of surgery:  [phone]    Remember:  Do not eat or drink anything  after midnight the night before your surgery   Take these medicines the morning of surgery with A SIP OF WATER: Lipitor. PRN- Norco,Robaxin    As of today, STOP taking any Aspirin (unless otherwise instructed by your surgeon) Aleve, Naproxen, Ibuprofen, Motrin, Advil, Goody's, BC's, all herbal medications, fish oil, and all vitamins.   is not responsible for any belongings or valuables. .   Do NOT Smoke (Tobacco/Vaping)  24 hours prior to your procedure  If you use a CPAP at night, you may bring your mask for your overnight stay.   Contacts, glasses, hearing aids, dentures or partials may not be worn into surgery, please bring cases for these belongings   Patients discharged the day of surgery will not be allowed to drive home, and someone needs to stay with them for 24 hours.   Special  instructions:    Oral Hygiene is also important to reduce your risk of infection.  Remember - BRUSH YOUR TEETH THE MORNING OF SURGERY WITH YOUR REGULAR TOOTHPASTE   Day of Surgery:  Take a shower the day of or night before with antibacterial soap. Wear Clean/Comfortable clothing the morning of surgery Do not apply any deodorants/lotions.   Do not wear jewelry or makeup Do not wear lotions, powders, perfumes/colognes, or deodorant. Do not shave 48 hours prior to surgery.  Men may shave face and neck. Do not bring valuables to the hospital. Do not wear nail polish, gel polish, artificial nails, or any other type of covering on natural nails (fingers and toes) If you have artificial nails or gel coating that need to be removed by a nail salon, please have this removed prior to surgery. Artificial nails or gel coating may interfere with anesthesia's ability to adequately monitor your vital signs. Remember to brush your teeth WITH YOUR REGULAR TOOTHPASTE.

## 2022-11-11 NOTE — Progress Notes (Signed)
Virtual Visit via Video Note  I connected with Raymond Velez on 11/11/22 at  1:00 PM EDT by a video enabled telemedicine application and verified that I am speaking with the correct person using two identifiers.  Location: Patient: Home Provider: Office Patient's daughter: Available by phone. Ronnell Freshwater [phone]   I discussed the limitations of evaluation and management by telemedicine and the availability of in person appointments. The patient expressed understanding and agreed to proceed.   Subjective:    Patient ID: Raymond Velez, male    DOB: April 27, 1955, 67 y.o.   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 The scan was initially recommended by his primary care physician due to concerns about the patient's breathing, which was perceived as heavier than normal. The patient reported a single episode of breathing difficulty two years ago during a trip to [place], but no recurrent or persistent respiratory issues since then. He also reported occasional coughing, but denied any productive cough or hemoptysis. The patient noted occasional chest discomfort in extreme heat, but did not describe it as severe.  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 He denied any known exposure to tuberculosis or asbestos. The patient had a chest X-ray in [place] in July of the current year, which did not reveal the mass detected on the recent CT scan. The patient also received multiple vaccinations around the same time, including a COVID-19 vaccine, a flu shot, and a pneumococcal vaccine, but did not report any significant adverse reactions.   Review of Systems As per HPI  Past Medical History:  Diagnosis Date   Arthritis    Hyperlipidemia    RBBB      No  family history on file.   Social History   Socioeconomic History   Marital status: Married    Spouse name: Not on file   Number of children: 2   Years of education: Not on file   Highest education level: Not on file  Occupational History   Not on file  Tobacco Use   Smoking status: Every Day    Current packs/day: 1.50    Types: Cigarettes   Smokeless tobacco: Never  Vaping Use   Vaping status: Never Used  Substance and Sexual Activity   Alcohol use: Yes    Alcohol/week: 0.0 standard drinks of alcohol    Comment: beer occasionally   Drug use: No   Sexual activity: Not on file  Other Topics Concern   Not on file  Social History Narrative   Not on file   Social Determinants of Health   Financial Resource Strain: Not on file  Food Insecurity: Not on file  Transportation Needs: Not on file  Physical Activity: Not on file  Stress: Not on file  Social Connections: Unknown (08/30/2021)   Received from [facility]   Social Network    Social Network: Not on file  Intimate Partner Violence: Unknown (08/30/2021)   Received from [facility]   HITS    Physically Hurt: Not on file    Insult or Talk Down To: Not on file    Threaten Physical Harm: Not on file    Scream or Curse: Not on file   From [place], has also lived in [place] Has been a truck Hospital doctor, 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     CHANTIX STARTING MONTH PAK 0.5 MG X 11 & 1 MG X 42 tablet Has not picked up yet     ferrous sulfate 325 (65 FE) MG tablet Take 1 tablet (325 mg total) by mouth 2 (two) times daily with a meal for 14 days. 28 tablet 0   HYDROcodone-acetaminophen (NORCO) 7.5-325 MG tablet Take 1-2 tablets by mouth every 6 (six) hours as needed for severe pain (pain score 7-10). 56 tablet 0   methocarbamol (ROBAXIN) 500 MG tablet Take 1 tablet (500 mg  total) by mouth every 6 (six) hours as needed for muscle spasms. 40 tablet 0   polyethylene glycol (MIRALAX / GLYCOLAX) 17 g packet Take 17 g by mouth daily as needed for mild constipation. 14 each 0   SENNOSIDES PO Take 8.5 mg by mouth 3 (three) times daily with meals. Swiss Kriss Herbal Laxative Tablets  Finely Powdered Sun-Dried Leaves of SennaInactive Ingredients: Finely Powdered Strawberry Leaves, Peach Leaves, Anise Seed, Caraway Seed, Hibiscus and Calendula Flowers (for their flavoring), Carminative Principles     No facility-administered medications prior to visit.        Objective:   Radiology: CT scan of the chest performed 11/04/2022 reviewed by me shows a 5 mm prevascular node, 4 mm benign calcified granuloma in the right middle lobe, centrilobular and paraseptal emphysema.  There is a 5.4 x 4.2 x 3.7 cm left upper lobe suprahilar mass with surrounding postobstructive changes and interstitial situation and nodularity that could reflect lymphangitic spread and satellite nodularity.  Also increased bandlike atelectasis in the anterior left upper lobe     Assessment & Plan:  Mass of upper lobe of left lung Lung Mass CT scan shows a solid mass-like lesion in the upper part of the left lung, with surrounding haze areas that could represent inflammation or pneumonia. The mass is concerning for lung cancer. The patient has a history of heavy smoking for 35 years. No symptoms of pneumonia or post-obstruction pneumonia. -Perform bronchoscopy under anesthesia on 11/14/2022 to obtain biopsies for pathology. -Order PET scan for more definitive staging. -Refer to oncology for further management pending biopsy results.  Tobacco use disorder Consider PFTs going forward to establish degree of obstructive lung disease.  There is emphysema on his CT chest and he does have some cough, no physical limitations or functional limitation.  Denies any dyspnea.  Occasional chest discomfort.  Will need to  work hard on cessation    I discussed the assessment and treatment plan with the patient. The patient was provided an opportunity to ask questions and all were answered. The patient agreed with the plan and demonstrated an understanding of the instructions.   The patient was advised to call back or seek an in-person evaluation if the symptoms worsen or if the condition fails to improve as anticipated.  I provided 35 minutes of non-face-to-face time during this encounter.   Levy Pupa, MD, PhD 11/11/2022, 1:33 PM [facility] [phone] or if no answer before 7:00PM call [phone] For any issues after 7:00PM please call eLink [phone]

## 2022-11-11 NOTE — Assessment & Plan Note (Signed)
Lung Mass CT scan shows a solid mass-like lesion in the upper part of the left lung, with surrounding haze areas that could represent inflammation or pneumonia. The mass is concerning for lung cancer. The patient has a history of heavy smoking for 35 years. No symptoms of pneumonia or post-obstruction pneumonia. -Perform bronchoscopy under anesthesia on 11/14/2022 to obtain biopsies for pathology. -Order PET scan for more definitive staging. -Refer to oncology for further management pending biopsy results.

## 2022-11-11 NOTE — Telephone Encounter (Signed)
Virtual office visit planned for today

## 2022-11-12 NOTE — Telephone Encounter (Signed)
Routing to Dr. Lamonte Sakai as an Juluis Rainier

## 2022-11-14 ENCOUNTER — Encounter (HOSPITAL_COMMUNITY)
Admission: RE | Disposition: A | Discharge status: Home / Self Care | Payer: Self-pay | Source: Home / Self Care | Attending: Emergency Medicine

## 2022-11-14 ENCOUNTER — Ambulatory Visit (HOSPITAL_BASED_OUTPATIENT_CLINIC_OR_DEPARTMENT_OTHER): Payer: Medicare Other | Admitting: Anesthesiology

## 2022-11-14 ENCOUNTER — Ambulatory Visit (HOSPITAL_COMMUNITY): Payer: Medicare Other | Admitting: Anesthesiology

## 2022-11-14 ENCOUNTER — Other Ambulatory Visit: Payer: Self-pay

## 2022-11-14 ENCOUNTER — Encounter (HOSPITAL_COMMUNITY): Payer: Self-pay | Admitting: Emergency Medicine

## 2022-11-14 ENCOUNTER — Ambulatory Visit (HOSPITAL_COMMUNITY)
Admission: RE | Admit: 2022-11-14 | Discharge: 2022-11-14 | Disposition: A | Discharge status: Home / Self Care | Payer: Medicare Other | Attending: Emergency Medicine | Admitting: Emergency Medicine

## 2022-11-14 DIAGNOSIS — J449 Chronic obstructive pulmonary disease, unspecified: Secondary | ICD-10-CM

## 2022-11-14 DIAGNOSIS — J439 Emphysema, unspecified: Secondary | ICD-10-CM | POA: Insufficient documentation

## 2022-11-14 DIAGNOSIS — R918 Other nonspecific abnormal finding of lung field: Secondary | ICD-10-CM | POA: Diagnosis present

## 2022-11-14 DIAGNOSIS — C3412 Malignant neoplasm of upper lobe, left bronchus or lung: Secondary | ICD-10-CM | POA: Diagnosis not present

## 2022-11-14 DIAGNOSIS — F17210 Nicotine dependence, cigarettes, uncomplicated: Secondary | ICD-10-CM | POA: Insufficient documentation

## 2022-11-14 DIAGNOSIS — R911 Solitary pulmonary nodule: Secondary | ICD-10-CM | POA: Diagnosis present

## 2022-11-14 HISTORY — PX: BRONCHIAL BIOPSY: SHX5109

## 2022-11-14 HISTORY — PX: VIDEO BRONCHOSCOPY: SHX5072

## 2022-11-14 HISTORY — PX: HEMOSTASIS CONTROL: SHX6838

## 2022-11-14 HISTORY — PX: BRONCHIAL BRUSHINGS: SHX5108

## 2022-11-14 LAB — BASIC METABOLIC PANEL
Anion gap: 12 (ref 5–15)
BUN: 18 mg/dL (ref 8–23)
CO2: 23 mmol/L (ref 22–32)
Calcium: 9.5 mg/dL (ref 8.9–10.3)
Chloride: 104 mmol/L (ref 98–111)
Creatinine, Ser: 0.79 mg/dL (ref 0.61–1.24)
GFR, Estimated: >60 mL/min (ref >60)
Glucose, Bld: 88 mg/dL (ref 70–99)
Potassium: 4.3 mmol/L (ref 3.5–5.1)
Sodium: 139 mmol/L (ref 135–145)

## 2022-11-14 LAB — SURGICAL PCR SCREEN
MRSA, PCR: POSITIVE — AB
Staphylococcus aureus: POSITIVE — AB

## 2022-11-14 LAB — CBC
HCT: 41.1 % (ref 39.0–52.0)
Hemoglobin: 13.2 g/dL (ref 13.0–17.0)
MCH: 28.9 pg (ref 26.0–34.0)
MCHC: 32.1 g/dL (ref 30.0–36.0)
MCV: 89.9 fL (ref 80.0–100.0)
Platelets: 260 10*3/uL (ref 150–400)
RBC: 4.57 MIL/uL (ref 4.22–5.81)
RDW: 13.3 % (ref 11.5–15.5)
WBC: 9 10*3/uL (ref 4.0–10.5)
nRBC: 0 % (ref 0.0–0.2)

## 2022-11-14 SURGERY — BRONCHOSCOPY, VIDEO-ASSISTED
Anesthesia: General

## 2022-11-14 MED ORDER — CHLORHEXIDINE GLUCONATE 0.12 % MT SOLN: 15.0000 mL | Freq: Once | OROMUCOSAL | Status: AC

## 2022-11-14 MED ORDER — EPINEPHRINE 1 MG/10ML IJ SOSY
PREFILLED_SYRINGE | INTRAMUSCULAR | Status: AC
  Filled 2022-11-14: qty 10

## 2022-11-14 MED ORDER — ONDANSETRON HCL 4 MG/2ML IJ SOLN: 4.0000 mg | Freq: Once | INTRAMUSCULAR | Status: DC | PRN

## 2022-11-14 MED ORDER — ONDANSETRON HCL 4 MG/2ML IJ SOLN
INTRAMUSCULAR | Status: DC | PRN
  Administered 2022-11-14: 4 mg via INTRAVENOUS

## 2022-11-14 MED ORDER — DEXAMETHASONE SODIUM PHOSPHATE 10 MG/ML IJ SOLN
INTRAMUSCULAR | Status: DC | PRN
  Administered 2022-11-14: 5 mg via INTRAVENOUS

## 2022-11-14 MED ORDER — CHLORHEXIDINE GLUCONATE 0.12 % MT SOLN
OROMUCOSAL | Status: AC
  Administered 2022-11-14: 15 mL via OROMUCOSAL
  Filled 2022-11-14: qty 15

## 2022-11-14 MED ORDER — PROPOFOL 500 MG/50ML IV EMUL
INTRAVENOUS | Status: DC | PRN
  Administered 2022-11-14: 100 ug/kg/min via INTRAVENOUS

## 2022-11-14 MED ORDER — EPINEPHRINE 1 MG/ML IJ SOLN
INTRAMUSCULAR | Status: DC | PRN
  Administered 2022-11-14 (×3): 2 mL via ENDOTRACHEOPULMONARY

## 2022-11-14 MED ORDER — PHENYLEPHRINE 80 MCG/ML (10ML) SYRINGE FOR IV PUSH (FOR BLOOD PRESSURE SUPPORT)
PREFILLED_SYRINGE | INTRAVENOUS | Status: DC | PRN
  Administered 2022-11-14: 160 ug via INTRAVENOUS

## 2022-11-14 MED ORDER — PROPOFOL 10 MG/ML IV BOLUS
INTRAVENOUS | Status: DC | PRN
  Administered 2022-11-14: 150 mg via INTRAVENOUS

## 2022-11-14 MED ORDER — ACETAMINOPHEN 500 MG PO TABS
ORAL_TABLET | ORAL | Status: AC
  Administered 2022-11-14: 1000 mg via ORAL
  Filled 2022-11-14: qty 2

## 2022-11-14 MED ORDER — ACETAMINOPHEN 500 MG PO TABS
1000.0000 mg | ORAL_TABLET | Freq: Once | ORAL | Status: AC
Start: 2022-11-14 — End: 2022-11-14

## 2022-11-14 MED ORDER — AMISULPRIDE (ANTIEMETIC) 5 MG/2ML IV SOLN
10.0000 mg | Freq: Once | INTRAVENOUS | Status: DC | PRN
Start: 2022-11-14 — End: 2022-11-14

## 2022-11-14 MED ORDER — HYDROMORPHONE HCL 1 MG/ML IJ SOLN: 0.2500 mg | INTRAMUSCULAR | Status: DC | PRN

## 2022-11-14 MED ORDER — LIDOCAINE 2% (20 MG/ML) 5 ML SYRINGE
INTRAMUSCULAR | Status: DC | PRN
  Administered 2022-11-14: 60 mg via INTRAVENOUS

## 2022-11-14 MED ORDER — OXYCODONE HCL 5 MG/5ML PO SOLN: 5.0000 mg | Freq: Once | ORAL | Status: DC | PRN

## 2022-11-14 MED ORDER — SUGAMMADEX SODIUM 200 MG/2ML IV SOLN
INTRAVENOUS | Status: DC | PRN
  Administered 2022-11-14: 200 mg via INTRAVENOUS

## 2022-11-14 MED ORDER — SODIUM CHLORIDE 0.9 % IV SOLN
INTRAVENOUS | Status: DC
Start: 2022-11-14 — End: 2022-11-14

## 2022-11-14 MED ORDER — ROCURONIUM BROMIDE 10 MG/ML (PF) SYRINGE
PREFILLED_SYRINGE | INTRAVENOUS | Status: DC | PRN
  Administered 2022-11-14: 60 mg via INTRAVENOUS

## 2022-11-14 MED ORDER — GLYCOPYRROLATE 0.2 MG/ML IJ SOLN
INTRAMUSCULAR | Status: DC | PRN
  Administered 2022-11-14: .2 mg via INTRAVENOUS

## 2022-11-14 MED ORDER — OXYCODONE HCL 5 MG PO TABS: 5.0000 mg | ORAL_TABLET | Freq: Once | ORAL | Status: DC | PRN

## 2022-11-14 NOTE — Discharge Instructions (Signed)
Flexible Bronchoscopy, Care After This sheet gives you information about how to care for yourself after your test. Your doctor may also give you more specific instructions. If you have problems or questions, contact your doctor. Follow these instructions at home: Eating and drinking When your numbness is gone and your cough and gag reflexes have come back, you may: Eat only soft foods. Slowly drink liquids. The day after the test, go back to your normal diet. Driving Do not drive for 24 hours if you were given a medicine to help you relax (sedative). Do not drive or use heavy machinery while taking prescription pain medicine. General instructions  Take over-the-counter and prescription medicines only as told by your doctor. Return to your normal activities as told. Ask what activities are safe for you. Do not use any products that have nicotine or tobacco in them. This includes cigarettes and e-cigarettes. If you need help quitting, ask your doctor. Keep all follow-up visits as told by your doctor. This is important. It is very important if you had a tissue sample (biopsy) taken. Get help right away if: You have shortness of breath that gets worse. You get light-headed. You feel like you are going to pass out (faint). You have chest pain. You cough up: More than a little blood. More blood than before. Summary Do not eat or drink anything (not even water) for 2 hours after your test, or until your numbing medicine wears off. Do not use cigarettes. Do not use e-cigarettes. Get help right away if you have chest pain.  Please call our office with any questions or concerns.  [phone].   This information is not intended to replace advice given to you by your health care provider. Make sure you discuss any questions you have with your health care provider. Document Released: 10/24/2008 Document Revised: 12/09/2016 Document Reviewed: 01/15/2016 Elsevier Patient Education  2020  Elsevier Inc.  

## 2022-11-14 NOTE — Anesthesia Preprocedure Evaluation (Addendum)
Anesthesia Evaluation  Patient identified by MRN, date of birth, ID band Patient awake    Reviewed: Allergy & Precautions, NPO status , Patient's Chart, lab work & pertinent test results  Airway Mallampati: III  TM Distance: >3 FB Neck ROM: Full    Dental  (+) Teeth Intact, Dental Advisory Given Permanent implants:   Pulmonary COPD, Current Smoker and Patient abstained from smoking. LUL mass Current smoker 1ppd no inhalers   Pulmonary exam normal breath sounds clear to auscultation       Cardiovascular negative cardio ROS Normal cardiovascular exam Rhythm:Regular Rate:Normal     Neuro/Psych negative neurological ROS  negative psych ROS   GI/Hepatic negative GI ROS, Neg liver ROS,,,  Endo/Other  negative endocrine ROS    Renal/GU negative Renal ROS  negative genitourinary   Musculoskeletal  (+) Arthritis , Osteoarthritis,    Abdominal   Peds  Hematology negative hematology ROS (+)   Anesthesia Other Findings   Reproductive/Obstetrics negative OB ROS                             Anesthesia Physical Anesthesia Plan  ASA: 3  Anesthesia Plan: General   Post-op Pain Management: Tylenol PO (pre-op)*   Induction: Intravenous  PONV Risk Score and Plan: 1 and Ondansetron, Dexamethasone, Midazolam and Treatment may vary due to age or medical condition  Airway Management Planned: Oral ETT  Additional Equipment: None  Intra-op Plan:   Post-operative Plan: Extubation in OR  Informed Consent: I have reviewed the patients History and Physical, chart, labs and discussed the procedure including the risks, benefits and alternatives for the proposed anesthesia with the patient or authorized representative who has indicated his/her understanding and acceptance.     Dental advisory given  Plan Discussed with: CRNA  Anesthesia Plan Comments:        Anesthesia Quick Evaluation

## 2022-11-14 NOTE — Transfer of Care (Signed)
Immediate Anesthesia Transfer of Care Note  Patient: Raymond Velez  Procedure(s) Performed: VIDEO BRONCHOSCOPY BRONCHIAL BRUSHINGS BRONCHIAL BIOPSIES HEMOSTASIS CONTROL  Patient Location: PACU  Anesthesia Type:General  Level of Consciousness: awake, alert , and oriented  Airway & Oxygen Therapy: Patient Spontanous Breathing and Patient connected to face mask  Post-op Assessment: Report given to RN and Post -op Vital signs reviewed and stable  Post vital signs: Reviewed and stable  Last Vitals:  Vitals Value Taken Time  BP 119/55 11/14/22 1430  Temp 36.6 C 11/14/22 1427  Pulse 65 11/14/22 1438  Resp 6 11/14/22 1433  SpO2 94 % 11/14/22 1438  Vitals shown include unfiled device data.  Last Pain:  Vitals:   11/14/22 1427  TempSrc:   PainSc: (P) 0-No pain         Complications: No notable events documented.

## 2022-11-14 NOTE — Op Note (Signed)
Video Bronchoscopy Procedure Note  Date of Operation: 11/14/2022  Pre-op Diagnosis: Left upper lobe mass  Post-op Diagnosis: Same  Surgeon: Levy Pupa  Assistants: none  Anesthesia: General anesthesia  Operation: Flexible video fiberoptic bronchoscopy and biopsies.  Estimated Blood Loss: 15-20 cc  Complications: none noted  Indications and History: Raymond Velez is 67 y.o. with history of tobacco use.  Has been experiencing weight loss, some progressive dyspnea.  Underwent chest x-ray and CT chest that showed evidence for a left upper lobe mass.  Recommendation was to perform video fiberoptic bronchoscopy with biopsies. The risks, benefits, complications, treatment options and expected outcomes were discussed with the patient.  The possibilities of pneumothorax, pneumonia, reaction to medication, pulmonary aspiration, perforation of a viscus, bleeding, failure to diagnose a condition and creating a complication requiring transfusion or operation were discussed with the patient who freely signed the consent.    Description of Procedure: The patient was seen in the Preoperative Area, was examined and was deemed appropriate to proceed.  The patient was taken to [facility] endoscopy room 3, identified as Raymond Velez and the procedure verified as Flexible Video Fiberoptic Bronchoscopy.  A Time Out was held and the above information confirmed.   General anesthesia was initiated and the patient was endotracheally intubated.  The video fiberoptic bronchoscope was introduced via the endotracheal tube and a general inspection was performed which showed normal trachea, normal main carina. The R sided airways were inspected and showed normal RUL, BI, RML and RLL. The L side was then inspected.  There was a vascular exophytic irregular endobronchial lesion emanating from the left upper lobe airway.  The carina between the left upper lobe airways and the lingula was thickened.  The lingular airway  was patent and appear to be unaffected.  1:10000 epi was injected onto the lesion in divided doses, 6 cc total.  Endobronchial brushings were performed on the lesion for cytology.  The brush would not pass the occluded airway.  Forceps were then introduced and they would pass the lesion into the left upper lobe airway and then met resistance.  Endobronchial forceps biopsies were performed to be sent for pathology and cytology.  Multiple passes were taken.  There was some initial moderate bleeding that stopped quickly.  Epinephrine was used as above and cold saline rinse was performed. The patient tolerated the procedure well. The bronchoscope was removed. There were no obvious complications.   Samples: 1. Endobronchial brushings from left upper lobe mass 2. Endobronchial forceps biopsies from left upper lobe mass   Plans:  We will review the cytology, pathology results with the patient when they become available.  Outpatient followup will be with Dr Delton Coombes.    Levy Pupa, MD, PhD 11/14/2022, 2:15 PM [facility] [phone] or if no answer [phone]

## 2022-11-14 NOTE — Anesthesia Postprocedure Evaluation (Signed)
Anesthesia Post Note  Patient: Dacotah Carder  Procedure(s) Performed: VIDEO BRONCHOSCOPY BRONCHIAL BRUSHINGS BRONCHIAL BIOPSIES HEMOSTASIS CONTROL     Patient location during evaluation: PACU Anesthesia Type: General Level of consciousness: awake and alert, oriented and patient cooperative Pain management: pain level controlled Vital Signs Assessment: post-procedure vital signs reviewed and stable Respiratory status: spontaneous breathing, nonlabored ventilation and respiratory function stable Cardiovascular status: blood pressure returned to baseline and stable Postop Assessment: no apparent nausea or vomiting Anesthetic complications: no   No notable events documented.  Last Vitals:  Vitals:   11/14/22 1427 11/14/22 1430  BP: 123/86 (!) 119/55  Pulse: 63 69  Resp:    Temp: 36.6 C   SpO2: 98% 98%    Last Pain:  Vitals:   11/14/22 1427  TempSrc:   PainSc: 0-No pain                 Lannie Fields

## 2022-11-14 NOTE — Anesthesia Procedure Notes (Signed)
Procedure Name: Intubation Date/Time: 11/14/2022 1:48 PM  Performed by: Camillia Herter, CRNAPre-anesthesia Checklist: Patient identified, Emergency Drugs available, Suction available and Patient being monitored Patient Re-evaluated:Patient Re-evaluated prior to induction Oxygen Delivery Method: Circle System Utilized Preoxygenation: Pre-oxygenation with 100% oxygen Induction Type: IV induction Ventilation: Oral airway inserted - appropriate to patient size and Two handed mask ventilation required Laryngoscope Size: Miller and 2 Grade View: Grade I Tube type: Oral Tube size: 8.5 mm Number of attempts: 1 Airway Equipment and Method: Stylet and Oral airway Placement Confirmation: ETT inserted through vocal cords under direct vision, positive ETCO2 and breath sounds checked- equal and bilateral Secured at: 23 cm Tube secured with: Tape Dental Injury: Teeth and Oropharynx as per pre-operative assessment  Comments: Atraumatic. 2 hand Bag mask due to beard.

## 2022-11-14 NOTE — Interval H&P Note (Signed)
History and Physical Interval Note:  11/14/2022 12:18 PM  Raymond Velez  has presented today for surgery, with the diagnosis of LEFT UUPER LOPE MASS.  The various methods of treatment have been discussed with the patient and family. After consideration of risks, benefits and other options for treatment, the patient has consented to  Procedure(s): ROBOTIC ASSISTED NAVIGATIONAL BRONCHOSCOPY (N/A) as a surgical intervention.  The patient's history has been reviewed, patient examined, no change in status, stable for surgery.  I have reviewed the patient's chart and labs.  Questions were answered to the patient's satisfaction.     Leslye Peer

## 2022-11-15 ENCOUNTER — Other Ambulatory Visit: Payer: Self-pay | Admitting: Medical Oncology

## 2022-11-15 DIAGNOSIS — R911 Solitary pulmonary nodule: Secondary | ICD-10-CM

## 2022-11-16 ENCOUNTER — Other Ambulatory Visit: Payer: Medicare Other

## 2022-11-16 ENCOUNTER — Inpatient Hospital Stay: Payer: Medicare Other | Attending: Internal Medicine

## 2022-11-16 ENCOUNTER — Inpatient Hospital Stay: Payer: Medicare Other | Admitting: Internal Medicine

## 2022-11-16 ENCOUNTER — Encounter (HOSPITAL_COMMUNITY): Payer: Self-pay | Admitting: Emergency Medicine

## 2022-11-16 VITALS — BP 121/74 | HR 58 | Temp 97.5°F | Resp 16 | Ht 70.0 in | Wt 197.6 lb

## 2022-11-16 DIAGNOSIS — C3412 Malignant neoplasm of upper lobe, left bronchus or lung: Secondary | ICD-10-CM | POA: Insufficient documentation

## 2022-11-16 DIAGNOSIS — E785 Hyperlipidemia, unspecified: Secondary | ICD-10-CM | POA: Diagnosis not present

## 2022-11-16 DIAGNOSIS — F17210 Nicotine dependence, cigarettes, uncomplicated: Secondary | ICD-10-CM | POA: Diagnosis not present

## 2022-11-16 DIAGNOSIS — Z8673 Personal history of transient ischemic attack (TIA), and cerebral infarction without residual deficits: Secondary | ICD-10-CM | POA: Diagnosis not present

## 2022-11-16 DIAGNOSIS — R911 Solitary pulmonary nodule: Secondary | ICD-10-CM

## 2022-11-16 DIAGNOSIS — C3490 Malignant neoplasm of unspecified part of unspecified bronchus or lung: Secondary | ICD-10-CM

## 2022-11-16 LAB — CBC WITH DIFFERENTIAL (CANCER CENTER ONLY)
Abs Immature Granulocytes: 0.02 10*3/uL (ref 0.00–0.07)
Basophils Absolute: 0.1 10*3/uL (ref 0.0–0.1)
Basophils Relative: 1 %
Eosinophils Absolute: 0.2 10*3/uL (ref 0.0–0.5)
Eosinophils Relative: 2 %
HCT: 37 % — ABNORMAL LOW (ref 39.0–52.0)
Hemoglobin: 12.2 g/dL — ABNORMAL LOW (ref 13.0–17.0)
Immature Granulocytes: 0 %
Lymphocytes Relative: 40 %
Lymphs Abs: 3.4 10*3/uL (ref 0.7–4.0)
MCH: 29.7 pg (ref 26.0–34.0)
MCHC: 33 g/dL (ref 30.0–36.0)
MCV: 90 fL (ref 80.0–100.0)
Monocytes Absolute: 0.8 10*3/uL (ref 0.1–1.0)
Monocytes Relative: 9 %
Neutro Abs: 4.1 10*3/uL (ref 1.7–7.7)
Neutrophils Relative %: 48 %
Platelet Count: 249 10*3/uL (ref 150–400)
RBC: 4.11 MIL/uL — ABNORMAL LOW (ref 4.22–5.81)
RDW: 13.3 % (ref 11.5–15.5)
WBC Count: 8.6 10*3/uL (ref 4.0–10.5)
nRBC: 0 % (ref 0.0–0.2)

## 2022-11-16 LAB — CMP (CANCER CENTER ONLY)
ALT: 28 U/L (ref 0–44)
AST: 20 U/L (ref 15–41)
Albumin: 3.8 g/dL (ref 3.5–5.0)
Alkaline Phosphatase: 51 U/L (ref 38–126)
Anion gap: 5 (ref 5–15)
BUN: 21 mg/dL (ref 8–23)
CO2: 31 mmol/L (ref 22–32)
Calcium: 9.3 mg/dL (ref 8.9–10.3)
Chloride: 103 mmol/L (ref 98–111)
Creatinine: 0.78 mg/dL (ref 0.61–1.24)
GFR, Estimated: >60 mL/min (ref >60)
Glucose, Bld: 90 mg/dL (ref 70–99)
Potassium: 4.5 mmol/L (ref 3.5–5.1)
Sodium: 139 mmol/L (ref 135–145)
Total Bilirubin: 0.3 mg/dL (ref <1.2)
Total Protein: 7.2 g/dL (ref 6.5–8.1)

## 2022-11-16 LAB — CYTOLOGY - NON PAP

## 2022-11-16 NOTE — Progress Notes (Signed)
[facility] [phone]   [phone]  CONSULT NOTE  REFERRING PHYSICIAN: Dr. Levy Pupa  REASON FOR CONSULTATION:  67 years old white male recently diagnosed with lung cancer HPI Raymond Velez is a 67 y.o. male came to the clinic today for initial evaluation.  His daughter Raymond Velez and son Raymond Velez were available by phone during the visit.   Discussed the use of AI scribe software for clinical note transcription with the patient, who gave verbal consent to proceed.  History of Present Illness   The patient, a 67 year old retired [employer], presents with a recent diagnosis of non-small cell lung cancer, specifically squamous cell carcinoma. The diagnosis was made following a CT scan, which was prompted by the primary care physician's concern over abnormal lung sounds and the patient's extensive smoking history. The CT scan revealed a mass in the left upper lobe, obstructing the bronchus.  Repeat CT super D of the chest on November 04, 2022 showed the left upper lobe suprahilar mass measuring 5.4 x 4.2 x 3.7 cm with surrounding postobstructive pneumonitis/pneumonia especially posteriorly increased from previous and surrounding interstitial accentuation and nodularity could be from lymphangitic spread or satellite nodules.  A subsequent bronchoscopy by Dr. Delton Coombes confirmed the diagnosis.  The patient reports no significant symptoms related to the lung cancer. There is no chest pain, shortness of breath, or significant cough. The patient did note a mild cough with a small amount of blood following the bronchoscopy, but this has since resolved. There has been no recent weight loss, with the patient intentionally losing 18 pounds prior to the diagnosis but has since regained some weight. The patient denies any nausea, vomiting, or diarrhea.  The patient has a history of arthritis, high cholesterol, and right bundle branch block. There is also a history of an old  stroke, discovered incidentally on a CT scan of the head approximately seven to eight years ago. The patient has no history of heart attacks, diabetes, or high blood pressure.  The patient has a significant smoking history, having smoked for 35 years, more than a pack a day. The patient quit smoking four days prior to the consultation. The patient drinks alcohol socially and denies any illicit drug use. There are no known drug allergies.  The patient's family history is significant for a father who died at 86 years old from asthma and a brother who died from lung cancer. Both were smokers. The patient's mother's medical history is unknown. The patient is married with two children.  The patient's activity level is good, with no reported limitations. The patient was working as a Naval architect until recently retiring.       Past Medical History:  Diagnosis Date   Arthritis    Hyperlipidemia    RBBB     Past Surgical History:  Procedure Laterality Date   BRONCHIAL BIOPSY  11/14/2022   Procedure: BRONCHIAL BIOPSIES;  Surgeon: Leslye Peer, MD;  Location: [facility] ENDOSCOPY;  Service: Pulmonary;;   BRONCHIAL BRUSHINGS  11/14/2022   Procedure: BRONCHIAL BRUSHINGS;  Surgeon: Leslye Peer, MD;  Location: [facility] ENDOSCOPY;  Service: Pulmonary;;   COLONOSCOPY      x 3   HEMOSTASIS CONTROL  11/14/2022   Procedure: HEMOSTASIS CONTROL;  Surgeon: Leslye Peer, MD;  Location: [facility] ENDOSCOPY;  Service: Pulmonary;;  epi and cold saline   TOTAL HIP ARTHROPLASTY Left 10/09/2018   Procedure: TOTAL HIP ARTHROPLASTY ANTERIOR APPROACH;  Surgeon: Durene Romans, MD;  Location: [facility]  ORS;  Service: Orthopedics;  Laterality: Left;  70 mins   VIDEO BRONCHOSCOPY N/A 11/14/2022   Procedure: VIDEO BRONCHOSCOPY;  Surgeon: Leslye Peer, MD;  Location: [facility] ENDOSCOPY;  Service: Pulmonary;  Laterality: N/A;    No family history on file.  Social History Social History   Tobacco Use   Smoking status: Every Day    Current  packs/day: 1.50    Types: Cigarettes   Smokeless tobacco: Never  Vaping Use   Vaping status: Never Used  Substance Use Topics   Alcohol use: Yes    Alcohol/week: 0.0 standard drinks of alcohol    Comment: beer occasionally   Drug use: No    No Known Allergies  No current outpatient medications on file.   No current facility-administered medications for this visit.    Review of Systems  Constitutional: negative Eyes: negative Ears, nose, mouth, throat, and face: negative Respiratory: positive for cough Cardiovascular: negative Gastrointestinal: negative Genitourinary:negative Integument/breast: negative Hematologic/lymphatic: negative Musculoskeletal:negative Neurological: negative Behavioral/Psych: negative Endocrine: negative Allergic/Immunologic: negative  Physical Exam  ZOX:WRUEA, healthy, no distress, well nourished, and well developed SKIN: skin color, texture, turgor are normal, no rashes or significant lesions HEAD: Normocephalic, No masses, lesions, tenderness or abnormalities EYES: normal, PERRLA, Conjunctiva are pink and non-injected EARS: External ears normal, Canals clear OROPHARYNX:no exudate, no erythema, and lips, buccal mucosa, and tongue normal  NECK: supple, no adenopathy, no JVD LYMPH:  no palpable lymphadenopathy, no hepatosplenomegaly LUNGS: prolonged expiratory phase HEART: regular rate & rhythm, no murmurs, and no gallops ABDOMEN:abdomen soft, non-tender, normal bowel sounds, and no masses or organomegaly BACK: Back symmetric, no curvature., No CVA tenderness EXTREMITIES:no joint deformities, effusion, or inflammation, no edema  NEURO: alert & oriented x 3 with fluent speech, no focal motor/sensory deficits  PERFORMANCE STATUS: ECOG 1  LABORATORY DATA: Lab Results  Component Value Date   WBC 8.6 11/16/2022   HGB 12.2 (L) 11/16/2022   HCT 37.0 (L) 11/16/2022   MCV 90.0 11/16/2022   PLT 249 11/16/2022      Chemistry       Component Value Date/Time   NA 139 11/14/2022 1106   K 4.3 11/14/2022 1106   CL 104 11/14/2022 1106   CO2 23 11/14/2022 1106   BUN 18 11/14/2022 1106   CREATININE 0.79 11/14/2022 1106   CREATININE 0.69 (L) 09/24/2014 0828      Component Value Date/Time   CALCIUM 9.5 11/14/2022 1106   ALKPHOS 51 09/24/2014 0828   AST 19 09/24/2014 0828   ALT 21 09/24/2014 0828   BILITOT 0.7 09/24/2014 0828       RADIOGRAPHIC STUDIES: CT Super D Chest Wo Contrast  Result Date: 11/04/2022 CLINICAL DATA:  Left upper lobe mass EXAM: CT CHEST WITHOUT CONTRAST TECHNIQUE: Multidetector CT imaging of the chest was performed using thin slice collimation for electromagnetic bronchoscopy planning purposes, without intravenous contrast. RADIATION DOSE REDUCTION: This exam was performed according to the departmental dose-optimization program which includes automated exposure control, adjustment of the mA and/or kV according to patient size and/or use of iterative reconstruction technique. COMPARISON:  08/06/2022 FINDINGS: Cardiovascular: Coronary, aortic arch, and branch vessel atherosclerotic vascular disease. Mediastinum/Nodes: Prevascular node 0.5 cm in short axis on image 20 series 2, stable. Lungs/Pleura: Centrilobular and paraseptal emphysema. Airway thickening is present, suggesting bronchitis or reactive airways disease. 4 mm benign calcified granuloma in the right middle lobe, image 85 series 3. The left upper lobe suprahilar mass measures proximally 5.4 by 4.2 by 3.7 cm. There is surrounding  postobstructive pneumonitis/pneumonia especially posteriorly, increased from previous. Surrounding interstitial accentuation and nodularity could be from lymphangitic spread and satellite nodules. Increased bandlike atelectasis anteriorly in the left upper lobe. The left superior lobar bronchus is occluded. Upper Abdomen: Unremarkable Musculoskeletal: Thoracic spondylosis. IMPRESSION: 1. The left upper lobe suprahilar mass  measures 5.4 by 4.2 by 3.7 cm. There is surrounding postobstructive pneumonitis/pneumonia especially posteriorly, increased from previous. Surrounding interstitial accentuation and nodularity could be from lymphangitic spread and satellite nodules. 2. Increased bandlike atelectasis anteriorly in the left upper lobe. 3. Airway thickening is present, suggesting bronchitis or reactive airways disease. 4. Coronary, aortic arch, and branch vessel atherosclerotic vascular disease. Aortic Atherosclerosis (ICD10-I70.0) and Emphysema (ICD10-J43.9). Electronically Signed   By: Gaylyn Rong M.D.   On: 11/04/2022 17:19    ASSESSMENT: This is a very pleasant 67 years old white male recently diagnosed with a stage IIb (T3, N0, M0) non-small cell lung cancer, squamous cell carcinoma presented with left suprahilar mass with suspicious postobstructive pneumonitis/lymphangitic spread and satellite nodules diagnosed in November 2024. Molecular studies and PD-L1 expression are still pending.  PLAN: I had a lengthy discussion with the patient and his family today about his current condition and possible treatment options. Stage IIB (T3, N0, M0) Non-Small Cell Lung Cancer (Squamous Cell Carcinoma) Newly diagnosed via bronchoscopy on November 14, 2022. Left upper lobe mass obstructing left upper lobe bronchus. No current symptoms. Pending PET scan and MRI of the brain to determine extent of disease and staging. Discussed potential treatment options including surgery, chemotherapy, and immunotherapy, dependent on PET scan results. -Order MRI of the brain. -Review results of PET scan when available. -Consider referral to surgeon based on PET scan results. -Plan to discuss case in tumor conference next week. -Potential medications for treatment include Carboplatin, Paclitaxel, and Nivolumab for neoadjuvant treatment.  History of Stroke Reported old stroke 7-8 years ago. No current neurological symptoms. -Continue  monitoring.  Hyperlipidemia and Arthritis Chronic conditions, no changes reported. -Continue current management.  Smoking Cessation Quit smoking 4 days ago after 35 years of smoking. -Encourage continued cessation.  Weight Loss Intentional weight loss of 18 pounds, now regaining weight. -Encourage healthy diet and weight maintenance.  The patient was advised to call immediately if he has any other concerning symptoms in the interval.   The patient voices understanding of current disease status and treatment options and is in agreement with the current care plan.  All questions were answered. The patient knows to call the clinic with any problems, questions or concerns. We can certainly see the patient much sooner if necessary.  Thank you so much for allowing me to participate in the care of Raymond Velez. I will continue to follow up the patient with you and assist in his care.  The total time spent in the appointment was 60 minutes.  Disclaimer: This note was dictated with voice recognition software. Similar sounding words can inadvertently be transcribed and may not be corrected upon review.   Lajuana Matte November 16, 2022, 2:34 PM

## 2022-11-16 NOTE — Progress Notes (Signed)
I met the pt face to face at his medical oncology consult with Dr. Arbutus Ped. I introduced myself and explained my role as a Statistician. The plan for the pt right now is to complete his cancer staging work up with a PET scan scheduled for 11/7 and a Brain MRI for which I will schedule for him.  I have already emailed the request to [facility] pathology for the pts tissue be sent to [facility] for molecular studies and PD-L1.  I provided the Lung Cancer Journey book and my card with my direct phone number on it and encouraged the pt to call me if he has any questions or concerns.

## 2022-11-17 ENCOUNTER — Encounter (HOSPITAL_COMMUNITY)
Admission: RE | Admit: 2022-11-17 | Discharge: 2022-11-17 | Disposition: A | Discharge status: Home / Self Care | Payer: Medicare Other | Source: Ambulatory Visit | Attending: Emergency Medicine | Admitting: Emergency Medicine

## 2022-11-17 ENCOUNTER — Ambulatory Visit (HOSPITAL_COMMUNITY)
Admission: RE | Admit: 2022-11-17 | Discharge: 2022-11-17 | Disposition: A | Discharge status: Home / Self Care | Payer: Medicare Other | Source: Ambulatory Visit | Attending: Internal Medicine | Admitting: Internal Medicine

## 2022-11-17 DIAGNOSIS — R911 Solitary pulmonary nodule: Secondary | ICD-10-CM | POA: Insufficient documentation

## 2022-11-17 DIAGNOSIS — C3490 Malignant neoplasm of unspecified part of unspecified bronchus or lung: Secondary | ICD-10-CM | POA: Insufficient documentation

## 2022-11-17 DIAGNOSIS — R918 Other nonspecific abnormal finding of lung field: Secondary | ICD-10-CM | POA: Insufficient documentation

## 2022-11-17 LAB — GLUCOSE, CAPILLARY: Glucose-Capillary: 89 mg/dL (ref 70–99)

## 2022-11-17 MED ORDER — GADOBUTROL 1 MMOL/ML IV SOLN
9.0000 mL | Freq: Once | INTRAVENOUS | Status: AC | PRN
Start: 2022-11-17 — End: 2022-11-17
  Administered 2022-11-17: 9 mL via INTRAVENOUS

## 2022-11-17 MED ORDER — FLUDEOXYGLUCOSE F - 18 (FDG) INJECTION
9.8000 | Freq: Once | INTRAVENOUS | Status: AC
  Administered 2022-11-17: 9.8 via INTRAVENOUS

## 2022-11-21 ENCOUNTER — Encounter: Payer: Self-pay | Admitting: Internal Medicine

## 2022-11-21 ENCOUNTER — Other Ambulatory Visit: Payer: Self-pay | Admitting: Internal Medicine

## 2022-11-21 DIAGNOSIS — R918 Other nonspecific abnormal finding of lung field: Secondary | ICD-10-CM

## 2022-11-22 ENCOUNTER — Ambulatory Visit (INDEPENDENT_AMBULATORY_CARE_PROVIDER_SITE_OTHER): Payer: Medicare Other | Admitting: Acute Care

## 2022-11-22 ENCOUNTER — Encounter: Payer: Self-pay | Admitting: Acute Care

## 2022-11-22 VITALS — BP 119/67 | HR 64 | Ht 67.0 in | Wt 198.8 lb

## 2022-11-22 DIAGNOSIS — C3412 Malignant neoplasm of upper lobe, left bronchus or lung: Secondary | ICD-10-CM

## 2022-11-22 DIAGNOSIS — Z87891 Personal history of nicotine dependence: Secondary | ICD-10-CM | POA: Diagnosis not present

## 2022-11-22 DIAGNOSIS — Z9889 Other specified postprocedural states: Secondary | ICD-10-CM | POA: Diagnosis not present

## 2022-11-22 NOTE — Progress Notes (Signed)
History of Present Illness Raymond Velez is a 67 y.o. male current every day heavy  smoker with an abnormal CT Chest. He is followed by Dr. Delton Coombes.  Synopsis The patient, a 67 year old retired [employer], presents with a recent diagnosis of non-small cell lung cancer, specifically squamous cell carcinoma. The diagnosis was made following a CT scan, which was prompted by the primary care physician's concern over abnormal lung sounds and the patient's extensive smoking history. The CT scan revealed a mass in the left upper lobe, obstructing the bronchus.  Repeat CT super D of the chest on November 04, 2022 showed the left upper lobe suprahilar mass measuring 5.4 x 4.2 x 3.7 cm with surrounding postobstructive pneumonitis/pneumonia especially posteriorly increased from previous and surrounding interstitial accentuation and nodularity could be from lymphangitic spread or satellite nodules. Subsequent PET scan shows Hypermetabolic LEFT upper lobe suprahilar mass consistent with primary bronchogenic carcinoma. MR Brain shows No evidence of intracranial metastatic disease. Pt underwent Flexible video fiberoptic bronchoscopy and biopsies 11/14/2022 per Dr. Delton Coombes.   Pt. Was seen by Dr. Arbutus Ped 11/16/2022. Prior to PET imaging he Suspected Stage IIB (T3, N0, M0) Non-Small Cell Lung Cancer (Squamous Cell Carcinoma) Newly diagnosed via bronchoscopy on November 14, 2022. Molecular studies and PD-L1 expression are still pending. Intentional weight loss of 18 pounds , subsequently he has gained some of this weight back. He is concerned about his weight gain since quitting smoking.Plan prior to PET potential treatment options including surgery, chemotherapy, and immunotherapy.He quit smoking 11/12/2022.   11/22/2022 Pt. Presents for follow up after bronchoscopy with biopsies. He states he has done well after the procedure. Some scant bleeding after the procedure that has subsequently cleared.No fever, discolored  secretions or adverse reaction to the anesthesia.  He has been seen by Dr. Arbutus Ped. He ordered PET and MRI Brain to complete staging and determine options for treatment. PFT's have also been ordered. PET scan shows the left upper lobe is hypermetabolic as well as a peripheral left upper lobe nodule that may be related to lung collapse, but favored to be metastasis.No lymph nodes or distant disease noted. MRI Brain shows no intracranial metastatic disease. He will be discussed at the thoracic conference 11/24/2022, and plan of care will be relayed to the patient based on review of imaging and discussion by the group per Dr. Asa Lente note.  Pt does have a slight cough at times. This is non -productive.Otherwise he is at his baseline.  PFT's >> ordered 11/21/2022 >> pending   Cytology 11/14/2022 FINAL MICROSCOPIC DIAGNOSIS:  A. LUNG, LUL, ENDOBRONCHIAL BRUSHING:  - Non-small cell carcinoma  - See comment   B. LUNG, LUL, ENDOBRONCHIAL BIOPSY:  - Non-small cell carcinoma  - See comment   Immunohistochemistry is performed to better characterize the non-small cell carcinoma.  Immunohistochemistry shows positive staining with p40 and cytokeratin 5/6 and negative staining with TTF-1 consistent with squamous cell carcinoma.  There is sufficient tissue in the cellblock for additional studies.    PET Scan  Hypermetabolic LEFT upper lobe suprahilar mass consistent with primary bronchogenic carcinoma. 2. Peripheral nodule in the LEFT upper lobe is suspicious for satellite nodule versus postobstructive collapse. Favor malignancy 3. No evidence of metastatic adenopathy in the mediastinum. 4. No evidence distant metastatic disease. 5.  Aortic Atherosclerosis (ICD10-I70.0).    11/17/2022 MR Brain with and without No evidence of intracranial metastatic disease. 2. Small chronic infarct within the right cerebellar hemisphere. 3. Mild generalized cerebral volume loss.  Super D CT  Chest  11/04/2022 The left upper lobe suprahilar mass measures 5.4 by 4.2 by 3.7 cm. There is surrounding postobstructive pneumonitis/pneumonia especially posteriorly, increased from previous. Surrounding interstitial accentuation and nodularity could be from lymphangitic spread and satellite nodules. 2. Increased bandlike atelectasis anteriorly in the left upper lobe. 3. Airway thickening is present, suggesting bronchitis or reactive airways disease. 4. Coronary, aortic arch, and branch vessel atherosclerotic vascular disease.   Aortic Atherosclerosis (ICD10-I70.0) and Emphysema (ICD10-J43.9). Test Results:     Latest Ref Rng & Units 11/16/2022    2:02 PM 11/14/2022   11:06 AM 10/10/2018    3:12 AM  CBC  WBC 4.0 - 10.5 K/uL 8.6  9.0  18.5   Hemoglobin 13.0 - 17.0 g/dL 52.8  41.3  24.4   Hematocrit 39.0 - 52.0 % 37.0  41.1  37.9   Platelets 150 - 400 K/uL 249  260  202        Latest Ref Rng & Units 11/16/2022    2:02 PM 11/14/2022   11:06 AM 10/10/2018    3:12 AM  BMP  Glucose 70 - 99 mg/dL 90  88  010   BUN 8 - 23 mg/dL 21  18  13    Creatinine 0.61 - 1.24 mg/dL 2.72  5.36  6.44   Sodium 135 - 145 mmol/L 139  139  134   Potassium 3.5 - 5.1 mmol/L 4.5  4.3  4.3   Chloride 98 - 111 mmol/L 103  104  101   CO2 22 - 32 mmol/L 31  23  26    Calcium 8.9 - 10.3 mg/dL 9.3  9.5  8.7     BNP No results found for: "BNP"  ProBNP No results found for: "PROBNP"  PFT No results found for: "FEV1PRE", "FEV1POST", "FVCPRE", "FVCPOST", "TLC", "DLCOUNC", "PREFEV1FVCRT", "PSTFEV1FVCRT"  NM PET Image Initial (PI) Skull Base To Thigh  Result Date: 11/18/2022 CLINICAL DATA:  Initial treatment strategy for pulmonary mass.  Bike EXAM: NUCLEAR MEDICINE PET SKULL BASE TO THIGH TECHNIQUE: 9.8 mCi F-18 FDG was injected intravenously. Full-ring PET imaging was performed from the skull base to thigh after the radiotracer. CT data was obtained and used for attenuation correction and anatomic localization.  Fasting blood glucose: 89 mg/dl COMPARISON:  03/47/4259 FINDINGS: Mediastinal blood pool activity: SUV max Liver activity: SUV max NA NECK: No hypermetabolic lymph nodes in the neck. Incidental CT findings: None. CHEST: LEFT upper lobe suprahilar mass measures 5.3 by 4.4 cm and is intensely hypermetabolic with SUV max equal 23.5. Nodule peripheral to the mass measures 2.4 cm on image 70 with mild metabolic activity (SUV max equal 4.6). No hypermetabolic mediastinal lymph nodes. Incidental CT findings: Coronary artery calcification and aortic atherosclerotic calcification. ABDOMEN/PELVIS: No abnormal hypermetabolic activity within the liver, pancreas, adrenal glands, or spleen. No hypermetabolic lymph nodes in the abdomen or pelvis. Incidental CT findings: None. SKELETON: No focal hypermetabolic activity to suggest skeletal metastasis. Incidental CT findings: LEFT hip arthroplasty IMPRESSION: 1. Hypermetabolic LEFT upper lobe suprahilar mass consistent with primary bronchogenic carcinoma. 2. Peripheral nodule in the LEFT upper lobe is suspicious for satellite nodule versus postobstructive collapse. Favor malignancy 3. No evidence of metastatic adenopathy in the mediastinum. 4. No evidence distant metastatic disease. 5.  Aortic Atherosclerosis (ICD10-I70.0). Electronically Signed   By: Genevive Bi M.D.   On: 11/18/2022 08:55   MR BRAIN W WO CONTRAST  Result Date: 11/17/2022 CLINICAL DATA:  Provided history: Non-small cell lung cancer, staging. Malignant neoplasm of unspecified part of unspecified  bronchus or lung. EXAM: MRI HEAD WITHOUT AND WITH CONTRAST TECHNIQUE: Multiplanar, multiecho pulse sequences of the brain and surrounding structures were obtained without and with intravenous contrast. CONTRAST:  9mL GADAVIST GADOBUTROL 1 MMOL/ML IV SOLN COMPARISON:  Head CT 12/25/2018. FINDINGS: Brain: Mild generalized cerebral volume loss. Small chronic infarct within the right cerebellar hemisphere. No cortical  encephalomalacia is identified. No significant cerebral white matter disease. There is no acute infarct. No evidence of an intracranial mass. No chronic intracranial blood products. No extra-axial fluid collection. No midline shift. No pathologic intracranial enhancement identified. Vascular: Maintained flow voids within the proximal large arterial vessels. Skull and upper cervical spine: No focal worrisome marrow lesion. Incompletely assessed cervical spondylosis. Sinuses/Orbits: No mass or acute finding within the imaged orbits. Trace mucosal thickening within an asymmetrically small left maxillary sinus. Minimal mucosal thickening within right ethmoid air cells. IMPRESSION: 1. No evidence of intracranial metastatic disease. 2. Small chronic infarct within the right cerebellar hemisphere. 3. Mild generalized cerebral volume loss. Electronically Signed   By: Jackey Loge D.O.   On: 11/17/2022 16:58   CT Super D Chest Wo Contrast  Result Date: 11/04/2022 CLINICAL DATA:  Left upper lobe mass EXAM: CT CHEST WITHOUT CONTRAST TECHNIQUE: Multidetector CT imaging of the chest was performed using thin slice collimation for electromagnetic bronchoscopy planning purposes, without intravenous contrast. RADIATION DOSE REDUCTION: This exam was performed according to the departmental dose-optimization program which includes automated exposure control, adjustment of the mA and/or kV according to patient size and/or use of iterative reconstruction technique. COMPARISON:  08/06/2022 FINDINGS: Cardiovascular: Coronary, aortic arch, and branch vessel atherosclerotic vascular disease. Mediastinum/Nodes: Prevascular node 0.5 cm in short axis on image 20 series 2, stable. Lungs/Pleura: Centrilobular and paraseptal emphysema. Airway thickening is present, suggesting bronchitis or reactive airways disease. 4 mm benign calcified granuloma in the right middle lobe, image 85 series 3. The left upper lobe suprahilar mass measures  proximally 5.4 by 4.2 by 3.7 cm. There is surrounding postobstructive pneumonitis/pneumonia especially posteriorly, increased from previous. Surrounding interstitial accentuation and nodularity could be from lymphangitic spread and satellite nodules. Increased bandlike atelectasis anteriorly in the left upper lobe. The left superior lobar bronchus is occluded. Upper Abdomen: Unremarkable Musculoskeletal: Thoracic spondylosis. IMPRESSION: 1. The left upper lobe suprahilar mass measures 5.4 by 4.2 by 3.7 cm. There is surrounding postobstructive pneumonitis/pneumonia especially posteriorly, increased from previous. Surrounding interstitial accentuation and nodularity could be from lymphangitic spread and satellite nodules. 2. Increased bandlike atelectasis anteriorly in the left upper lobe. 3. Airway thickening is present, suggesting bronchitis or reactive airways disease. 4. Coronary, aortic arch, and branch vessel atherosclerotic vascular disease. Aortic Atherosclerosis (ICD10-I70.0) and Emphysema (ICD10-J43.9). Electronically Signed   By: Gaylyn Rong M.D.   On: 11/04/2022 17:19     Past medical hx Past Medical History:  Diagnosis Date   Arthritis    Hyperlipidemia    RBBB      Social History   Tobacco Use   Smoking status: Former    Current packs/day: 0.00    Average packs/day: 1.5 packs/day for 15.1 years (22.7 ttl pk-yrs)    Types: Cigarettes    Start date: 11/22/2002    Quit date: 11/12/2022    Years since quitting: 0.0   Smokeless tobacco: Never  Vaping Use   Vaping status: Never Used  Substance Use Topics   Alcohol use: Yes    Alcohol/week: 0.0 standard drinks of alcohol    Comment: beer occasionally   Drug use: No  Mr.Markarian reports that he quit smoking 10 days ago. His smoking use included cigarettes. He started smoking about 20 years ago. He has a 22.7 pack-year smoking history. He has never used smokeless tobacco. He reports current alcohol use. He reports that he  does not use drugs.  Tobacco Cessation: Former smoker quit 11/12/2022 with a 20+ pack year history. He has been counseled to remain smoke free.    Past surgical hx, Family hx, Social hx all reviewed.  Current Outpatient Medications on File Prior to Visit  Medication Sig   atorvastatin (LIPITOR) 20 MG tablet Take 20 mg by mouth daily.   No current facility-administered medications on file prior to visit.     No Known Allergies  Review Of Systems:  Constitutional:   No  weight loss, night sweats,  Fevers, chills, fatigue, or  lassitude.  HEENT:   No headaches,  Difficulty swallowing,  Tooth/dental problems, or  Sore throat,                No sneezing, itching, ear ache, nasal congestion, post nasal drip,   CV:  No chest pain,  Orthopnea, PND, swelling in lower extremities, anasarca, dizziness, palpitations, syncope.   GI  No heartburn, indigestion, abdominal pain, nausea, vomiting, diarrhea, change in bowel habits, loss of appetite, bloody stools.   Resp: No shortness of breath with exertion or at rest.  No excess mucus, no productive cough,  No non-productive cough,  No coughing up of blood.  No change in color of mucus.  No wheezing.  No chest wall deformity  Skin: no rash or lesions.  GU: no dysuria, change in color of urine, no urgency or frequency.  No flank pain, no hematuria   MS:  No joint pain or swelling.  No decreased range of motion.  No back pain.  Psych:  No change in mood or affect. No depression or anxiety.  No memory loss.   Vital Signs BP 119/67   Pulse 64   Ht 5\' 7"  (1.702 m)   Wt 198 lb 12.8 oz (90.2 kg)   SpO2 100%   BMI 31.14 kg/m    Physical Exam:  General- No distress,  A&Ox3, pleasant ENT: No sinus tenderness, TM clear, pale nasal mucosa, no oral exudate,no post nasal drip, no LAN Cardiac: S1, S2, regular rate and rhythm, no murmur Chest: No wheeze/ rales/ dullness; no accessory muscle use, no nasal flaring, no sternal retractions,  diminished per bases.  Abd.: Soft Non-tender, ND, BS +, Body mass index is 31.14 kg/m.  Ext: No clubbing cyanosis, edema Neuro:  normal strength, MAE x 4, A&O x 3, appropriate Skin: No rashes, warm and dry, no lesions  Psych: normal mood and behavior   Assessment/Plan New Diagnosis LUL squamous cell carcinoma Former heavy smoker quit 11/12/2022 Post bronchoscopy with biopsies Plan I am glad you did well after your biopsy. Your biopsy was positive for a squamous cell lung cancer in the left upper lobe of the lung.  You have seen Dr. Arbutus Ped, and plan is for review PET scan results, PFT's and MR brain  findings with the Thoracic Conference this Thursday morning to determine best plan of care.  He will call you after the conference with the best option for plan of care.  Pt. Has order for PFT's but they have not been scheduled.  He will get a call to get these scheduled. Call us if you need [place].  Please contact office for sooner follow up if symptoms do  not improve or worsen or seek emergency care    I spent 20 minutes dedicated to the care of this patient on the date of this encounter to include pre-visit review of records, face-to-face time with the patient discussing conditions above, post visit ordering of testing, clinical documentation with the electronic health record, making appropriate referrals as documented, and communicating necessary information to the patient's healthcare team.   Bevelyn Ngo, NP 11/22/2022  10:28 AM

## 2022-11-22 NOTE — Patient Instructions (Addendum)
It is good to see you today. I am glad you did well after your biopsy. Your biopsy was positive for a squamous cell lung cancer in the left upper lobe of the lung.  You have seen Dr. Arbutus Ped, and plan is for review of these findings with the Thoracic Conference this Thursday morning.  He will call you after the conference  with the plan.  Pt. Has order for PFT's but they have not been scheduled.  You will get a call to get these scheduled. Call us if you need [place].  Please contact office for sooner follow up if symptoms do not improve or worsen or seek emergency care

## 2022-11-22 NOTE — Progress Notes (Signed)
Financial Assistance Application faxed to [facility] at this time. Successful transfer notice received.

## 2022-11-24 ENCOUNTER — Other Ambulatory Visit: Payer: Self-pay

## 2022-11-24 NOTE — Progress Notes (Signed)
The proposed treatment discussed in conference is for discussion purpose only and is not a binding recommendation.  The patients have not been physically examined, or presented with their treatment options.  Therefore, final treatment plans cannot be decided.  

## 2022-11-28 NOTE — Progress Notes (Signed)
I called the pt's daughter, [name], back at this time. She had called this morning asking if the pt had been able to have his surgical consult moved up to a sooner date. I called [name] back and let her know I had spoken to Dr.Mohamed and he asked me to reassure her that a few weeks wait is still okay. [name] understands, however like any pt's family member, wants everything to be done as soon as possible. I verbalized understanding of how she feels. She verbalized appreciation and is just hoping that if he can get surgery that it's done before Christmas.  I let her know that if something opens up sooner, I or the surgeons office will let her know.

## 2022-11-29 ENCOUNTER — Encounter (HOSPITAL_COMMUNITY): Payer: Self-pay

## 2022-11-30 ENCOUNTER — Encounter (HOSPITAL_COMMUNITY): Payer: Self-pay

## 2022-12-01 ENCOUNTER — Ambulatory Visit (HOSPITAL_BASED_OUTPATIENT_CLINIC_OR_DEPARTMENT_OTHER): Payer: Medicare Other | Admitting: Emergency Medicine

## 2022-12-01 DIAGNOSIS — R918 Other nonspecific abnormal finding of lung field: Secondary | ICD-10-CM | POA: Diagnosis not present

## 2022-12-01 LAB — PULMONARY FUNCTION TEST
DL/VA % pred: 117 %
DL/VA: 4.85 ml/min/mmHg/L
DLCO cor % pred: 111 %
DLCO cor: 28.48 ml/min/mmHg
DLCO unc % pred: 102 %
DLCO unc: 26.35 ml/min/mmHg
FEF 25-75 Post: 2.04 L/s
FEF 25-75 Pre: 1.47 L/s
FEF2575-%Change-Post: 38 %
FEF2575-%Pred-Post: 79 %
FEF2575-%Pred-Pre: 57 %
FEV1-%Change-Post: 9 %
FEV1-%Pred-Post: 82 %
FEV1-%Pred-Pre: 74 %
FEV1-Post: 2.67 L
FEV1-Pre: 2.43 L
FEV1FVC-%Change-Post: 3 %
FEV1FVC-%Pred-Pre: 90 %
FEV6-%Change-Post: 6 %
FEV6-%Pred-Post: 91 %
FEV6-%Pred-Pre: 85 %
FEV6-Post: 3.8 L
FEV6-Pre: 3.55 L
FEV6FVC-%Change-Post: 0 %
FEV6FVC-%Pred-Post: 104 %
FEV6FVC-%Pred-Pre: 103 %
FVC-%Change-Post: 6 %
FVC-%Pred-Post: 87 %
FVC-%Pred-Pre: 82 %
FVC-Post: 3.83 L
FVC-Pre: 3.61 L
Post FEV1/FVC ratio: 70 %
Post FEV6/FVC ratio: 99 %
Pre FEV1/FVC ratio: 67 %
Pre FEV6/FVC Ratio: 98 %
RV % pred: 159 %
RV: 3.67 L
TLC % pred: 113 %
TLC: 7.7 L

## 2022-12-01 NOTE — Progress Notes (Signed)
Full PFT Performed Today  

## 2022-12-01 NOTE — Patient Instructions (Signed)
Full PFT Performed Today  

## 2022-12-02 ENCOUNTER — Telehealth: Payer: Self-pay | Admitting: Internal Medicine

## 2022-12-02 NOTE — Telephone Encounter (Signed)
Patient is aware of scheduled appointment times/dates

## 2022-12-05 ENCOUNTER — Ambulatory Visit: Payer: Medicare Other | Admitting: Internal Medicine

## 2022-12-16 ENCOUNTER — Encounter: Payer: Self-pay | Admitting: Thoracic Surgery (Cardiothoracic Vascular Surgery)

## 2022-12-16 ENCOUNTER — Institutional Professional Consult (permissible substitution) (INDEPENDENT_AMBULATORY_CARE_PROVIDER_SITE_OTHER): Payer: Medicare Other | Admitting: Thoracic Surgery (Cardiothoracic Vascular Surgery)

## 2022-12-16 ENCOUNTER — Encounter: Payer: Medicare Other | Admitting: Thoracic Surgery (Cardiothoracic Vascular Surgery)

## 2022-12-16 VITALS — BP 135/68 | HR 71 | Resp 20 | Ht 68.75 in | Wt 200.0 lb

## 2022-12-16 DIAGNOSIS — R918 Other nonspecific abnormal finding of lung field: Secondary | ICD-10-CM | POA: Diagnosis not present

## 2022-12-21 ENCOUNTER — Inpatient Hospital Stay: Payer: Medicare Other | Attending: Internal Medicine | Admitting: Internal Medicine

## 2022-12-21 ENCOUNTER — Other Ambulatory Visit: Payer: Self-pay

## 2022-12-21 ENCOUNTER — Encounter: Payer: Self-pay | Admitting: Internal Medicine

## 2022-12-21 VITALS — BP 131/67 | HR 71 | Temp 97.2°F | Resp 16 | Ht 68.75 in | Wt 205.1 lb

## 2022-12-21 DIAGNOSIS — Z5189 Encounter for other specified aftercare: Secondary | ICD-10-CM | POA: Diagnosis not present

## 2022-12-21 DIAGNOSIS — Z5111 Encounter for antineoplastic chemotherapy: Secondary | ICD-10-CM | POA: Insufficient documentation

## 2022-12-21 DIAGNOSIS — Z79899 Other long term (current) drug therapy: Secondary | ICD-10-CM | POA: Diagnosis not present

## 2022-12-21 DIAGNOSIS — K5900 Constipation, unspecified: Secondary | ICD-10-CM | POA: Insufficient documentation

## 2022-12-21 DIAGNOSIS — Z5112 Encounter for antineoplastic immunotherapy: Secondary | ICD-10-CM | POA: Diagnosis present

## 2022-12-21 DIAGNOSIS — K649 Unspecified hemorrhoids: Secondary | ICD-10-CM | POA: Insufficient documentation

## 2022-12-21 DIAGNOSIS — C3412 Malignant neoplasm of upper lobe, left bronchus or lung: Secondary | ICD-10-CM | POA: Diagnosis not present

## 2022-12-21 DIAGNOSIS — R918 Other nonspecific abnormal finding of lung field: Secondary | ICD-10-CM

## 2022-12-21 MED ORDER — PROCHLORPERAZINE MALEATE 10 MG PO TABS: 10.0000 mg | ORAL_TABLET | Freq: Four times a day (QID) | ORAL | 1 refills | Status: DC | PRN

## 2022-12-21 NOTE — Progress Notes (Signed)
[facility] [phone]   [phone]  OFFICE PROGRESS NOTE  [facility], [facility] [address] [place] [address]  DIAGNOSIS: Stage IIB (T3, N0, M0) non-small cell lung cancer, squamous cell carcinoma presented with left suprahilar mass with suspicious postobstructive pneumonitis/lymphangitic spread and satellite nodules diagnosed in November 2024.   PRIOR THERAPY: None  CURRENT THERAPY: Neoadjuvant chemoimmunotherapy with carboplatin for AUC of 6, paclitaxel 200 Mg/M2 and nivolumab 360 Mg IV every 3 weeks with Neulasta support for total of 4 cycles.  INTERVAL HISTORY: Raymond Velez 67 y.o. male returns to the clinic today for follow-up visit accompanied by his son Raymond Velez and his daughter Raymond Velez was available by phone during the visit. Discussed the use of AI scribe software for clinical note transcription with the patient, who gave verbal consent to proceed.  History of Present Illness   Raymond Velez, a 67 year old individual, was diagnosed with stage 2B non-small cell lung cancer, specifically squamous cell carcinoma. He underwent a PET scan and an MRI of the brain. A cardiothoracic surgeon was consulted, who recommended chemotherapy and immunotherapy before considering surgery.  The patient's family sought a second opinion from [facility], as recommended by the cardiothoracic surgeon. The patient's family also consulted with a relative in [place], who agreed with the proposed treatment plan of chemoimmunotherapy.  The patient's mass was initially identified in [place] during a routine X-ray in July. The mass was more pronounced on a subsequent CT scan. The patient's family expressed concern about the mass potentially invading the main aorta, although this was not confirmed from the PET scan.  The patient had plans to travel to [place], but was advised to stay in the area due to the compromised immune system from the treatment.  The patient agreed to cancel the trip.       MEDICAL HISTORY: Past Medical History:  Diagnosis Date   Arthritis    Hyperlipidemia    RBBB     ALLERGIES:  has No Known Allergies.  MEDICATIONS:  Current Outpatient Medications  Medication Sig Dispense Refill   atorvastatin (LIPITOR) 20 MG tablet Take 20 mg by mouth daily.     No current facility-administered medications for this visit.    SURGICAL HISTORY:  Past Surgical History:  Procedure Laterality Date   BRONCHIAL BIOPSY  11/14/2022   Procedure: BRONCHIAL BIOPSIES;  Surgeon: Leslye Peer, MD;  Location: [facility] ENDOSCOPY;  Service: Pulmonary;;   BRONCHIAL BRUSHINGS  11/14/2022   Procedure: BRONCHIAL BRUSHINGS;  Surgeon: Leslye Peer, MD;  Location: [facility] ENDOSCOPY;  Service: Pulmonary;;   COLONOSCOPY      x 3   HEMOSTASIS CONTROL  11/14/2022   Procedure: HEMOSTASIS CONTROL;  Surgeon: Leslye Peer, MD;  Location: [facility] ENDOSCOPY;  Service: Pulmonary;;  epi and cold saline   TOTAL HIP ARTHROPLASTY Left 10/09/2018   Procedure: TOTAL HIP ARTHROPLASTY ANTERIOR APPROACH;  Surgeon: Durene Romans, MD;  Location: [facility] ORS;  Service: Orthopedics;  Laterality: Left;  70 mins   VIDEO BRONCHOSCOPY N/A 11/14/2022   Procedure: VIDEO BRONCHOSCOPY;  Surgeon: Leslye Peer, MD;  Location: [facility] ENDOSCOPY;  Service: Pulmonary;  Laterality: N/A;    REVIEW OF SYSTEMS:  Constitutional: positive for fatigue Eyes: negative Ears, nose, mouth, throat, and face: negative Respiratory: positive for cough Cardiovascular: negative Gastrointestinal: negative Genitourinary:negative Integument/breast: negative Hematologic/lymphatic: negative Musculoskeletal:negative Neurological: negative Behavioral/Psych: negative Endocrine: negative Allergic/Immunologic: negative   PHYSICAL EXAMINATION: General appearance: alert, cooperative, fatigued, and no distress Head:  Normocephalic, without obvious abnormality, atraumatic Neck: no adenopathy, no JVD, supple,  symmetrical, trachea midline, and thyroid not enlarged, symmetric, no tenderness/mass/nodules Lymph nodes: Cervical, supraclavicular, and axillary nodes normal. Resp: clear to auscultation bilaterally Back: symmetric, no curvature. ROM normal. No CVA tenderness. Cardio: regular rate and rhythm, S1, S2 normal, no murmur, click, rub or gallop GI: soft, non-tender; bowel sounds normal; no masses,  no organomegaly Extremities: extremities normal, atraumatic, no cyanosis or edema Neurologic: Alert and oriented X 3, normal strength and tone. Normal symmetric reflexes. Normal coordination and gait  ECOG PERFORMANCE STATUS: 1 - Symptomatic but completely ambulatory  Blood pressure 131/67, pulse 71, temperature (!) 97.2 F (36.2 C), temperature source Temporal, resp. rate 16, height 5' 8.75" (1.746 m), weight 205 lb 1.6 oz (93 kg), SpO2 100%.  LABORATORY DATA: Lab Results  Component Value Date   WBC 8.6 11/16/2022   HGB 12.2 (L) 11/16/2022   HCT 37.0 (L) 11/16/2022   MCV 90.0 11/16/2022   PLT 249 11/16/2022      Chemistry      Component Value Date/Time   NA 139 11/16/2022 1402   K 4.5 11/16/2022 1402   CL 103 11/16/2022 1402   CO2 31 11/16/2022 1402   BUN 21 11/16/2022 1402   CREATININE 0.78 11/16/2022 1402   CREATININE 0.69 (L) 09/24/2014 0828      Component Value Date/Time   CALCIUM 9.3 11/16/2022 1402   ALKPHOS 51 11/16/2022 1402   AST 20 11/16/2022 1402   ALT 28 11/16/2022 1402   BILITOT 0.3 11/16/2022 1402       RADIOGRAPHIC STUDIES: No results found.  ASSESSMENT AND PLAN: This is a very pleasant 67 years old white male with Stage IIB (T3, N0, M0) non-small cell lung cancer, squamous cell carcinoma presented with left suprahilar mass with suspicious postobstructive pneumonitis/lymphangitic spread and satellite nodules diagnosed in November 2024.     Stage IIB Non-Small Cell Lung Cancer (NSCLC) - Squamous Cell Carcinoma Raymond Velez, a 67 year old male, presents with  stage IIB NSCLC, squamous cell carcinoma. Recent PET scan and brain MRI were performed. The cardiothoracic surgeon, Dr. Cliffton Asters recommended neoadjuvant chemoimmunotherapy before considering surgery due to the tumor's size and potential invasion into the main aorta and multiple lobes. The goal is to shrink the tumor to avoid a pneumonectomy. The patient and family were informed about the treatment plan, including chemoimmunotherapy before surgery, followed by additional immunotherapy post-surgery. Side effects discussed include alopecia, neuropathy, cytopenias, and flu-like symptoms from the white blood cell booster. The patient agreed to proceed. Clinical trials show a 50% five-year survival rate with surgery and four cycles of chemotherapy for stage IIB NSCLC. The new approach aims to improve survival, though data is still immature. Referred to [facility] for a second opinion and potential surgery consultation with Dr. Alphonzo Dublin. - Start neoadjuvant chemoimmunotherapy next week - Administer treatment every three weeks for four cycles - Provide injection two days post-chemoimmunotherapy to boost white blood count - Prescribe Claritin and Tylenol for injection side effects - Prescribe anti-nausea medication and send to [facility] - Arrange chemo class for patient education - Perform weekly blood work during treatment - Schedule follow-up one week after the first treatment to assess response and side effects - Consider port placement if needed - Refer to [facility] for a second opinion and potential surgery consultation with Dr. Alphonzo Dublin - Advise against travel during treatment, especially in the cold season  Follow-up - Schedule follow-up one week after the first treatment -  Perform CT scan post-neoadjuvant treatment to assess tumor response - Consider PET scan if required by the surgeon before surgery.   The patient was advised to call immediately if he has any other concerning symptoms in the  interval.  The patient voices understanding of current disease status and treatment options and is in agreement with the current care plan.  All questions were answered. The patient knows to call the clinic with any problems, questions or concerns. We can certainly see the patient much sooner if necessary.  The total time spent in the appointment was 55 minutes.  Disclaimer: This note was dictated with voice recognition software. Similar sounding words can inadvertently be transcribed and may not be corrected upon review.

## 2022-12-21 NOTE — Progress Notes (Signed)
START ON PATHWAY REGIMEN - Non-Small Cell Lung     A cycle is every 21 days:     Nivolumab      Paclitaxel      Carboplatin   **Always confirm dose/schedule in your pharmacy ordering system**  Patient Characteristics: Preoperative or Nonsurgical Candidate (Clinical Staging), Stage II, Surgical Candidate, EGFR Negative and ALK Negative, AND Neoadjuvant Chemo/Immunotherapy Preferred, Squamous Cell Therapeutic Status: Preoperative or Nonsurgical Candidate (Clinical Staging) AJCC T Category: cT3 AJCC N Category: cN0 AJCC M Category: cM0 AJCC 8 Stage Grouping: IIB ALK Fusion/Rearrangement Status: Negative Neoadjuvant Chemo/Immunotherapy Preferred<= Yes EGFR Mutation Status: Negative/Wild Type Histology: Squamous Cell Intent of Therapy: Curative Intent, Discussed with Patient

## 2022-12-21 NOTE — Progress Notes (Signed)
[address]       [place] [address]             [phone]                    Raymond Velez [facility] Medical Record #[id number] Date of Birth: 23-Nov-1955  Referring: Si Gaul, MD Primary Care: [facility], [facility] Primary Cardiologist: None  Chief Complaint:    Chief Complaint  Patient presents with   Lung Mass    New patient consultation PET 11/7, MR Brain 11/7, Chest CT 10/25, Bronch 11/4, PFTs 11/21    History of Present Illness:    Raymond Velez 67 y.o. male presents for surgical evaluation of a large left pulmonary mass.  This was identified on imaging due to the patients smoking hx.  He has undergone biopsy, and results were consistent with NSCLC.        Past Medical History:  Diagnosis Date   Arthritis    Hyperlipidemia    RBBB     Past Surgical History:  Procedure Laterality Date   BRONCHIAL BIOPSY  11/14/2022   Procedure: BRONCHIAL BIOPSIES;  Surgeon: Leslye Peer, MD;  Location: [facility] ENDOSCOPY;  Service: Pulmonary;;   BRONCHIAL BRUSHINGS  11/14/2022   Procedure: BRONCHIAL BRUSHINGS;  Surgeon: Leslye Peer, MD;  Location[facility]COPY;  Service: Pulmonary;;   COLONOSCOPY      x 3   HEMOSTASIS CONTROL  11/14/2022   Procedure: HEMOSTASIS CONTROL;  Surgeon: Leslye Peer, MD;  Location: [facility] ENDOSCOPY;  Service: Pulmonary;;  epi and cold saline   TOTAL HIP ARTHROPLASTY Left 10/09/2018   Procedure: TOTAL HIP ARTHROPLASTY ANTERIOR APPROACH;  Surgeon: Durene Romans, MD;  Location: [facility];  Service: Orthopedics;  Laterality: Left;  70 mins   VIDEO BRONCHOSCOPY N/A 11/14/2022   Procedure: VIDEO BRONCHOSCOPY;  Surgeon: Leslye Peer, MD;  Location: [facility] ENDOSCOPY;  Service: Pulmonary;  Laterality: N/A;    No family history on file.   Social History   Tobacco Use  Smoking Status Former   Current packs/day: 0.00   Average packs/day: 1.5 packs/day for 15.1 years (22.7 ttl pk-yrs)   Types: Cigarettes   Start  date: 11/22/2002   Quit date: 11/12/2022   Years since quitting: 0.1  Smokeless Tobacco Never    Social History   Substance and Sexual Activity  Alcohol Use Yes   Alcohol/week: 0.0 standard drinks of alcohol   Comment: beer occasionally     No Known Allergies  Current Outpatient Medications  Medication Sig Dispense Refill   atorvastatin (LIPITOR) 20 MG tablet Take 20 mg by mouth daily.     No current facility-administered medications for this visit.    Review of Systems  Constitutional:  Negative for malaise/fatigue and weight loss.  Respiratory:  Positive for cough and shortness of breath.   Cardiovascular:  Negative for chest pain.  Neurological:  Negative for dizziness.     PHYSICAL EXAMINATION: BP 135/68 (BP Location: Right Arm, Patient Position: Sitting, Cuff Size: Normal)   Pulse 71   Resp 20   Ht 5' 8.75" (1.746 m)   Wt 200 lb (90.7 kg)   SpO2 99% Comment: RA  BMI 29.75 kg/m  Physical Exam Constitutional:      General: He is not in acute distress.    Appearance: He is not ill-appearing.  HENT:     Head: Normocephalic and atraumatic.  Eyes:     Extraocular Movements: Extraocular  movements intact.  Cardiovascular:     Rate and Rhythm: Normal rate.  Abdominal:     General: Abdomen is flat. There is no distension.  Musculoskeletal:        General: Normal range of motion.     Cervical back: Normal range of motion.  Skin:    General: Skin is warm and dry.  Neurological:     General: No focal deficit present.     Mental Status: He is alert and oriented to person, place, and time.          I have independently reviewed the above radiology studies  and reviewed the findings with the patient.   Recent Lab Findings: Lab Results  Component Value Date   WBC 8.6 11/16/2022   HGB 12.2 (L) 11/16/2022   HCT 37.0 (L) 11/16/2022   PLT 249 11/16/2022   GLUCOSE 90 11/16/2022   CHOL 186 09/24/2014   TRIG 247 (H) 09/24/2014   HDL 29 (L) 09/24/2014    LDLCALC 108 09/24/2014   ALT 28 11/16/2022   AST 20 11/16/2022   NA 139 11/16/2022   K 4.5 11/16/2022   CL 103 11/16/2022   CREATININE 0.78 11/16/2022   BUN 21 11/16/2022   CO2 31 11/16/2022   TSH 0.935 09/24/2014   HGBA1C 5.8 09/24/2014    Diagnostic Studies & Laboratory data:     Recent Radiology Findings:   No results found.   PFTs:  - FVC: 82% - FEV1: 74% -DLCO: 111%    Assessment / Plan:   67yo male with large left sided pulmonary mass.  It appears to involve the main PA, and likely would need intrapericardial access for complete resection.  Additionally, mediastinal lymph nodes were not sampled at the time of navigational biopsy.  Regardless of his surgical candidacy for pneumonectomy, he should be treated with chemotherapy.  Following this, his mediastinal nodes should be sample as well.  I have given him a referral to [facility] thoracic surgery for a second opinion as well.  He is scheduled to follow-up with medical oncology on 12/11     I  spent 60 minutes with  the patient face to face in counseling and coordination of care.    Corliss Skains 12/21/2022 9:12 AM

## 2022-12-22 ENCOUNTER — Inpatient Hospital Stay: Payer: Medicare Other

## 2022-12-22 ENCOUNTER — Other Ambulatory Visit: Payer: Self-pay

## 2022-12-22 DIAGNOSIS — Z5112 Encounter for antineoplastic immunotherapy: Secondary | ICD-10-CM | POA: Diagnosis not present

## 2022-12-22 DIAGNOSIS — C3412 Malignant neoplasm of upper lobe, left bronchus or lung: Secondary | ICD-10-CM

## 2022-12-22 LAB — CBC WITH DIFFERENTIAL (CANCER CENTER ONLY)
Abs Immature Granulocytes: 0.03 10*3/uL (ref 0.00–0.07)
Basophils Absolute: 0.1 10*3/uL (ref 0.0–0.1)
Basophils Relative: 1 %
Eosinophils Absolute: 0.1 10*3/uL (ref 0.0–0.5)
Eosinophils Relative: 1 %
HCT: 38.4 % — ABNORMAL LOW (ref 39.0–52.0)
Hemoglobin: 12.8 g/dL — ABNORMAL LOW (ref 13.0–17.0)
Immature Granulocytes: 0 %
Lymphocytes Relative: 23 %
Lymphs Abs: 2 10*3/uL (ref 0.7–4.0)
MCH: 29.9 pg (ref 26.0–34.0)
MCHC: 33.3 g/dL (ref 30.0–36.0)
MCV: 89.7 fL (ref 80.0–100.0)
Monocytes Absolute: 0.7 10*3/uL (ref 0.1–1.0)
Monocytes Relative: 8 %
Neutro Abs: 5.9 10*3/uL (ref 1.7–7.7)
Neutrophils Relative %: 67 %
Platelet Count: 250 10*3/uL (ref 150–400)
RBC: 4.28 MIL/uL (ref 4.22–5.81)
RDW: 13.2 % (ref 11.5–15.5)
WBC Count: 8.7 10*3/uL (ref 4.0–10.5)
nRBC: 0 % (ref 0.0–0.2)

## 2022-12-22 LAB — CMP (CANCER CENTER ONLY)
ALT: 26 U/L (ref 0–44)
AST: 18 U/L (ref 15–41)
Albumin: 4.1 g/dL (ref 3.5–5.0)
Alkaline Phosphatase: 61 U/L (ref 38–126)
Anion gap: 4 — ABNORMAL LOW (ref 5–15)
BUN: 17 mg/dL (ref 8–23)
CO2: 32 mmol/L (ref 22–32)
Calcium: 10 mg/dL (ref 8.9–10.3)
Chloride: 103 mmol/L (ref 98–111)
Creatinine: 0.76 mg/dL (ref 0.61–1.24)
GFR, Estimated: >60 mL/min (ref >60)
Glucose, Bld: 108 mg/dL — ABNORMAL HIGH (ref 70–99)
Potassium: 4.2 mmol/L (ref 3.5–5.1)
Sodium: 139 mmol/L (ref 135–145)
Total Bilirubin: 0.7 mg/dL (ref <1.2)
Total Protein: 8 g/dL (ref 6.5–8.1)

## 2022-12-23 ENCOUNTER — Telehealth: Payer: Self-pay | Admitting: Internal Medicine

## 2022-12-23 ENCOUNTER — Encounter: Payer: Self-pay | Admitting: Internal Medicine

## 2022-12-23 NOTE — Telephone Encounter (Signed)
Patient is aware of scheduled appointment times/dates for planned treatment

## 2022-12-24 ENCOUNTER — Other Ambulatory Visit: Payer: Self-pay | Admitting: Internal Medicine

## 2022-12-24 DIAGNOSIS — C3412 Malignant neoplasm of upper lobe, left bronchus or lung: Secondary | ICD-10-CM

## 2022-12-28 ENCOUNTER — Inpatient Hospital Stay: Payer: Medicare Other

## 2022-12-28 DIAGNOSIS — Z5112 Encounter for antineoplastic immunotherapy: Secondary | ICD-10-CM | POA: Diagnosis not present

## 2022-12-28 DIAGNOSIS — C3412 Malignant neoplasm of upper lobe, left bronchus or lung: Secondary | ICD-10-CM

## 2022-12-28 LAB — CBC WITH DIFFERENTIAL (CANCER CENTER ONLY)
Abs Immature Granulocytes: 0.02 10*3/uL (ref 0.00–0.07)
Basophils Absolute: 0 10*3/uL (ref 0.0–0.1)
Basophils Relative: 0 %
Eosinophils Absolute: 0.2 10*3/uL (ref 0.0–0.5)
Eosinophils Relative: 2 %
HCT: 39.7 % (ref 39.0–52.0)
Hemoglobin: 12.9 g/dL — ABNORMAL LOW (ref 13.0–17.0)
Immature Granulocytes: 0 %
Lymphocytes Relative: 32 %
Lymphs Abs: 2.9 10*3/uL (ref 0.7–4.0)
MCH: 29.1 pg (ref 26.0–34.0)
MCHC: 32.5 g/dL (ref 30.0–36.0)
MCV: 89.6 fL (ref 80.0–100.0)
Monocytes Absolute: 1 10*3/uL (ref 0.1–1.0)
Monocytes Relative: 11 %
Neutro Abs: 5 10*3/uL (ref 1.7–7.7)
Neutrophils Relative %: 55 %
Platelet Count: 261 10*3/uL (ref 150–400)
RBC: 4.43 MIL/uL (ref 4.22–5.81)
RDW: 13.3 % (ref 11.5–15.5)
WBC Count: 9 10*3/uL (ref 4.0–10.5)
nRBC: 0 % (ref 0.0–0.2)

## 2022-12-28 LAB — CMP (CANCER CENTER ONLY)
ALT: 22 U/L (ref 0–44)
AST: 17 U/L (ref 15–41)
Albumin: 4.3 g/dL (ref 3.5–5.0)
Alkaline Phosphatase: 61 U/L (ref 38–126)
Anion gap: 5 (ref 5–15)
BUN: 19 mg/dL (ref 8–23)
CO2: 32 mmol/L (ref 22–32)
Calcium: 10 mg/dL (ref 8.9–10.3)
Chloride: 101 mmol/L (ref 98–111)
Creatinine: 0.79 mg/dL (ref 0.61–1.24)
GFR, Estimated: >60 mL/min (ref >60)
Glucose, Bld: 88 mg/dL (ref 70–99)
Potassium: 4.4 mmol/L (ref 3.5–5.1)
Sodium: 138 mmol/L (ref 135–145)
Total Bilirubin: 0.6 mg/dL (ref <1.2)
Total Protein: 7.9 g/dL (ref 6.5–8.1)

## 2022-12-28 LAB — TSH: TSH: 1.329 u[IU]/mL (ref 0.350–4.500)

## 2022-12-28 MED FILL — Fosaprepitant Dimeglumine For IV Infusion 150 MG (Base Eq): INTRAVENOUS | Qty: 5 | Status: AC

## 2022-12-29 ENCOUNTER — Inpatient Hospital Stay: Payer: Medicare Other

## 2022-12-29 VITALS — BP 109/65 | HR 56 | Temp 98.3°F | Resp 16 | Ht 68.75 in | Wt 204.5 lb

## 2022-12-29 DIAGNOSIS — Z5112 Encounter for antineoplastic immunotherapy: Secondary | ICD-10-CM | POA: Diagnosis not present

## 2022-12-29 DIAGNOSIS — C3412 Malignant neoplasm of upper lobe, left bronchus or lung: Secondary | ICD-10-CM

## 2022-12-29 LAB — T4: T4, Total: 7.6 ug/dL (ref 4.5–12.0)

## 2022-12-29 MED ORDER — DIPHENHYDRAMINE HCL 50 MG/ML IJ SOLN
50.0000 mg | Freq: Once | INTRAMUSCULAR | Status: AC
  Administered 2022-12-29: 50 mg via INTRAVENOUS
  Filled 2022-12-29: qty 1

## 2022-12-29 MED ORDER — PALONOSETRON HCL INJECTION 0.25 MG/5ML
0.2500 mg | Freq: Once | INTRAVENOUS | Status: AC
  Administered 2022-12-29: 0.25 mg via INTRAVENOUS
  Filled 2022-12-29: qty 5

## 2022-12-29 MED ORDER — SODIUM CHLORIDE 0.9 % IV SOLN: INTRAVENOUS | Status: DC

## 2022-12-29 MED ORDER — FAMOTIDINE IN NACL 20-0.9 MG/50ML-% IV SOLN
20.0000 mg | Freq: Once | INTRAVENOUS | Status: AC
  Administered 2022-12-29: 20 mg via INTRAVENOUS
  Filled 2022-12-29: qty 50

## 2022-12-29 MED ORDER — FOSAPREPITANT DIMEGLUMINE INJECTION 150 MG
150.0000 mg | Freq: Once | INTRAVENOUS | Status: AC
  Administered 2022-12-29: 150 mg via INTRAVENOUS
  Filled 2022-12-29: qty 150

## 2022-12-29 MED ORDER — SODIUM CHLORIDE 0.9 % IV SOLN
360.0000 mg | Freq: Once | INTRAVENOUS | Status: AC
  Administered 2022-12-29: 360 mg via INTRAVENOUS
  Filled 2022-12-29: qty 12

## 2022-12-29 MED ORDER — CARBOPLATIN CHEMO INJECTION 600 MG/60ML
723.6000 mg | Freq: Once | INTRAVENOUS | Status: AC
  Administered 2022-12-29: 720 mg via INTRAVENOUS
  Filled 2022-12-29: qty 72

## 2022-12-29 MED ORDER — SODIUM CHLORIDE 0.9 % IV SOLN
200.0000 mg/m2 | Freq: Once | INTRAVENOUS | Status: AC
  Administered 2022-12-29: 426 mg via INTRAVENOUS
  Filled 2022-12-29: qty 71

## 2022-12-29 MED ORDER — DEXAMETHASONE SODIUM PHOSPHATE 10 MG/ML IJ SOLN
10.0000 mg | Freq: Once | INTRAMUSCULAR | Status: AC
Start: 2022-12-29 — End: 2022-12-29
  Administered 2022-12-29: 10 mg via INTRAVENOUS
  Filled 2022-12-29: qty 1

## 2022-12-29 NOTE — Patient Instructions (Addendum)
[facility] - A DEPT OF [facility]   Discharge Instructions: Thank you for choosing [facility] to provide your oncology and hematology care.   If you have a lab appointment with the Cancer Center, please go directly to the Cancer Center and check in at the registration area.   Wear comfortable clothing and clothing appropriate for easy access to any Portacath or PICC line.   We strive to give you quality time with your provider. You may need to reschedule your appointment if you arrive late (15 or more minutes).  Arriving late affects you and other patients whose appointments are after yours.  Also, if you miss three or more appointments without notifying the office, you may be dismissed from the clinic at the provider's discretion.      For prescription refill requests, have your pharmacy contact our office and allow 72 hours for refills to be completed.    Today you received the following chemotherapy and/or immunotherapy agents: Nivolumab (Opdivo), Paclitaxel (Taxol), and Carboplatin       To help prevent nausea and vomiting after your treatment, we encourage you to take your nausea medication as directed.  BELOW ARE SYMPTOMS THAT SHOULD BE REPORTED IMMEDIATELY: *FEVER GREATER THAN 100.4 F (38 C) OR HIGHER *CHILLS OR SWEATING *NAUSEA AND VOMITING THAT IS NOT CONTROLLED WITH YOUR NAUSEA MEDICATION *UNUSUAL SHORTNESS OF BREATH *UNUSUAL BRUISING OR BLEEDING *URINARY PROBLEMS (pain or burning when urinating, or frequent urination) *BOWEL PROBLEMS (unusual diarrhea, constipation, pain near the anus) TENDERNESS IN MOUTH AND THROAT WITH OR WITHOUT PRESENCE OF ULCERS (sore throat, sores in mouth, or a toothache) UNUSUAL RASH, SWELLING OR PAIN  UNUSUAL VAGINAL DISCHARGE OR ITCHING   Items with * indicate a potential emergency and should be followed up as soon as possible or go to the Emergency Department if any problems should occur.  Please  show the CHEMOTHERAPY ALERT CARD or IMMUNOTHERAPY ALERT CARD at check-in to the Emergency Department and triage nurse.  Should you have questions after your visit or need to cancel or reschedule your appointment, please contact [facility] - A [facility]  Dept: [phone]  and follow the prompts.  Office hours are 8:00 a.m. to 4:30 p.m. Monday - Friday. Please note that voicemails left after 4:00 p.m. may not be returned until the following business day.  We are closed weekends and major holidays. You have access to a nurse at all times for urgent questions. Please call the main number to the clinic Dept: [phone] and follow the prompts.   For any non-urgent questions, you may also contact your provider using MyChart. We now offer e-Visits for anyone 41 and older to request care online for non-urgent symptoms. For details visit [email or web address].   Also download the MyChart app! Go to the app store, search "MyChart", open the app, select [facility], and log in with your MyChart username and password.

## 2022-12-30 ENCOUNTER — Telehealth: Payer: Self-pay | Admitting: *Deleted

## 2022-12-30 NOTE — Telephone Encounter (Signed)
Called & left message for pt to return call to Dr Asa Lente RN to discuss how he did with his treatment.

## 2022-12-30 NOTE — Telephone Encounter (Signed)
-----   Message from Nurse Selena Batten sent at 12/29/2022  2:30 PM EST ----- Regarding: First Time Opdivo, Taxol, Carbo - Dr. Arbutus Ped Patient First Time Opdivo, Taxol, Carbo - Dr. Arbutus Ped Patient  Patient tolerated treatment well

## 2022-12-31 ENCOUNTER — Inpatient Hospital Stay: Payer: Medicare Other

## 2022-12-31 VITALS — BP 120/71 | HR 65 | Temp 97.9°F | Resp 13

## 2022-12-31 DIAGNOSIS — C3412 Malignant neoplasm of upper lobe, left bronchus or lung: Secondary | ICD-10-CM

## 2022-12-31 DIAGNOSIS — Z5112 Encounter for antineoplastic immunotherapy: Secondary | ICD-10-CM | POA: Diagnosis not present

## 2022-12-31 MED ORDER — PEGFILGRASTIM-FPGK 6 MG/0.6ML ~~LOC~~ SOSY
6.0000 mg | PREFILLED_SYRINGE | Freq: Once | SUBCUTANEOUS | Status: AC
  Administered 2022-12-31: 6 mg via SUBCUTANEOUS

## 2023-01-02 ENCOUNTER — Ambulatory Visit: Payer: Medicare Other | Admitting: Internal Medicine

## 2023-01-02 NOTE — Progress Notes (Signed)
I called the pt to follow up on how he's feeling after his first chemotherapy treatment. Pt states the chemo was fine, but he's in pain from the shot (neulasta) he got on Saturday. I asked him if he was taking over the counter claritin daily, and he confirmed his was. I suggested that as long as he doesn't have a fever, he can take tylenol for the pain as needed and to avoid NSAIDs like Motrin and Aleve. Pt verbalized understanding.  Pt is aware of his follow up appt with Dr Arbutus Ped on 12/26. I will follow up with the pt at that time.

## 2023-01-05 ENCOUNTER — Inpatient Hospital Stay: Payer: Medicare Other

## 2023-01-05 ENCOUNTER — Inpatient Hospital Stay: Payer: Medicare Other | Admitting: Internal Medicine

## 2023-01-05 VITALS — BP 130/72 | HR 84 | Temp 98.4°F | Resp 18 | Ht 68.75 in | Wt 205.0 lb

## 2023-01-05 DIAGNOSIS — C3412 Malignant neoplasm of upper lobe, left bronchus or lung: Secondary | ICD-10-CM

## 2023-01-05 DIAGNOSIS — Z5112 Encounter for antineoplastic immunotherapy: Secondary | ICD-10-CM | POA: Diagnosis not present

## 2023-01-05 LAB — CMP (CANCER CENTER ONLY)
ALT: 44 U/L (ref 0–44)
AST: 25 U/L (ref 15–41)
Albumin: 4.1 g/dL (ref 3.5–5.0)
Alkaline Phosphatase: 107 U/L (ref 38–126)
Anion gap: 6 (ref 5–15)
BUN: 23 mg/dL (ref 8–23)
CO2: 29 mmol/L (ref 22–32)
Calcium: 9.7 mg/dL (ref 8.9–10.3)
Chloride: 102 mmol/L (ref 98–111)
Creatinine: 1.31 mg/dL — ABNORMAL HIGH (ref 0.61–1.24)
GFR, Estimated: >60 mL/min (ref >60)
Glucose, Bld: 97 mg/dL (ref 70–99)
Potassium: 4.1 mmol/L (ref 3.5–5.1)
Sodium: 137 mmol/L (ref 135–145)
Total Bilirubin: 0.3 mg/dL (ref <1.2)
Total Protein: 7.6 g/dL (ref 6.5–8.1)

## 2023-01-05 LAB — CBC WITH DIFFERENTIAL (CANCER CENTER ONLY)
Abs Immature Granulocytes: 1.32 10*3/uL — ABNORMAL HIGH (ref 0.00–0.07)
Basophils Absolute: 0.2 10*3/uL — ABNORMAL HIGH (ref 0.0–0.1)
Basophils Relative: 1 %
Eosinophils Absolute: 0.4 10*3/uL (ref 0.0–0.5)
Eosinophils Relative: 2 %
HCT: 37 % — ABNORMAL LOW (ref 39.0–52.0)
Hemoglobin: 12.3 g/dL — ABNORMAL LOW (ref 13.0–17.0)
Immature Granulocytes: 6 %
Lymphocytes Relative: 14 %
Lymphs Abs: 3.3 10*3/uL (ref 0.7–4.0)
MCH: 29.1 pg (ref 26.0–34.0)
MCHC: 33.2 g/dL (ref 30.0–36.0)
MCV: 87.7 fL (ref 80.0–100.0)
Monocytes Absolute: 3 10*3/uL — ABNORMAL HIGH (ref 0.1–1.0)
Monocytes Relative: 13 %
Neutro Abs: 15.4 10*3/uL — ABNORMAL HIGH (ref 1.7–7.7)
Neutrophils Relative %: 64 %
Platelet Count: 218 10*3/uL (ref 150–400)
RBC: 4.22 MIL/uL (ref 4.22–5.81)
RDW: 13.8 % (ref 11.5–15.5)
WBC Count: 23.5 10*3/uL — ABNORMAL HIGH (ref 4.0–10.5)
nRBC: 0 % (ref 0.0–0.2)

## 2023-01-05 NOTE — Progress Notes (Signed)
[facility] [phone]   [phone]  OFFICE PROGRESS NOTE  Associates, [facility] [address] [place] [address]  DIAGNOSIS: Stage IIB (T3, N0, M0) non-small cell lung cancer, squamous cell carcinoma presented with left suprahilar mass with suspicious postobstructive pneumonitis/lymphangitic spread and satellite nodules diagnosed in November 2024.   PRIOR THERAPY: None  CURRENT THERAPY: Neoadjuvant chemoimmunotherapy with carboplatin for AUC of 6, paclitaxel 200 Mg/M2 and nivolumab 360 Mg IV every 3 weeks with Neulasta support for total of 4 cycles.  INTERVAL HISTORY: Raymond Velez 67 y.o. male returns to the clinic today for follow-up visit and his daughter Raymond Velez was available by phone during the visit. Discussed the use of AI scribe software for clinical note transcription with the patient, who gave verbal consent to proceed.  History of Present Illness   The patient, a 67 year old diagnosed with stage 2B non-small cell lung cancer (squamous cell carcinoma) in November 2024, recently started the first cycle of neoadjuvant chemotherapy and immunotherapy. The regimen includes carboplatin, paclitaxel, and Nivolumab followed by Udenyca injection. Post-treatment, the patient reported no adverse effects from the chemotherapy but experienced issues following the injection.  The patient reported coughing up blood in the mornings, although it was unclear whether the blood originated from the respiratory tract or the oral cavity. The amount was described as 'tinged blood' and occurred three to four times.  The patient also reported constipation, a change from his usual bowel movement frequency, and hemorrhoids, which he had experienced previously. The constipation was described as a delay of about 24 hours, compared to his usual 12-14 hours. He has been taking a stool softener, Dulcolax and a natural remedy, Swiss Cress, which he  reported as helpful. The patient experienced a hemorrhoidal bleed the previous night, which resolved by the following day after using suppositories.  The patient also reported numbness and nerve pain in unspecified locations, which he attributed to the chemotherapy. He denied experiencing nausea, vomiting, or diarrhea. The patient's breathing was reported as good, with no problems noted. However, he reported pain associated with his hemorrhoids, describing it as if something was 'touching the prostate.'  The patient's white blood cell count was noted as high, attributed to the injection received as part of his treatment. 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       MEDICAL HISTORY: Past Medical History:  Diagnosis Date   Arthritis    Hyperlipidemia    RBBB     ALLERGIES:  has no known allergies.  MEDICATIONS:  Current Outpatient Medications  Medication Sig Dispense Refill   atorvastatin (LIPITOR) 20 MG tablet Take 20 mg by mouth daily.     prochlorperazine (COMPAZINE) 10 MG tablet Take 1 tablet (10 mg total) by mouth every 6 (six) hours as needed for nausea or vomiting. 30 tablet 1   No current facility-administered medications for this visit.    SURGICAL HISTORY:  Past Surgical History:  Procedure Laterality Date   BRONCHIAL BIOPSY  11/14/2022   Procedure: BRONCHIAL BIOPSIES;  Surgeon: Leslye Peer, MD;  Location[facility] ENDOSCOPY;  Service: Pulmonary;;   BRONCHIAL BRUSHINGS  11/14/2022   Procedure: BRONCHIAL BRUSHINGS;  Surgeon: Leslye Peer, MD;  Location: [facility] ENDOSCOPY;  Service: Pulmonary;;   COLONOSCOPY      x 3   HEMOSTASIS CONTROL  11/14/2022   Procedure: HEMOSTASIS CONTROL;  Surgeon: Leslye Peer, MD;  Location: [facility]  ENDOSCOPY;  Service: Pulmonary;;  epi and cold saline   TOTAL HIP ARTHROPLASTY Left 10/09/2018   Procedure: TOTAL HIP ARTHROPLASTY ANTERIOR APPROACH;  Surgeon: Durene Romans, MD;  Location:  [facility] ORS;  Service: Orthopedics;  Laterality: Left;  70 mins   VIDEO BRONCHOSCOPY N/A 11/14/2022   Procedure: VIDEO BRONCHOSCOPY;  Surgeon: Leslye Peer, MD;  Location: [facility] ENDOSCOPY;  Service: Pulmonary;  Laterality: N/A;    REVIEW OF SYSTEMS:  Constitutional: positive for fatigue Eyes: negative Ears, nose, mouth, throat, and face: negative Respiratory: positive for cough and hemoptysis Cardiovascular: negative Gastrointestinal: positive for constipation Genitourinary:negative Integument/breast: negative Hematologic/lymphatic: negative Musculoskeletal:negative Neurological: negative Behavioral/Psych: negative Endocrine: negative Allergic/Immunologic: negative   PHYSICAL EXAMINATION: General appearance: alert, cooperative, fatigued, and no distress Head: Normocephalic, without obvious abnormality, atraumatic Neck: no adenopathy, no JVD, supple, symmetrical, trachea midline, and thyroid not enlarged, symmetric, no tenderness/mass/nodules Lymph nodes: Cervical, supraclavicular, and axillary nodes normal. Resp: clear to auscultation bilaterally Back: symmetric, no curvature. ROM normal. No CVA tenderness. Cardio: regular rate and rhythm, S1, S2 normal, no murmur, click, rub or gallop GI: soft, non-tender; bowel sounds normal; no masses,  no organomegaly Extremities: extremities normal, atraumatic, no cyanosis or edema Neurologic: Alert and oriented X 3, normal strength and tone. Normal symmetric reflexes. Normal coordination and gait  ECOG PERFORMANCE STATUS: 1 - Symptomatic but completely ambulatory  Blood pressure 130/72, pulse 84, temperature 98.4 F (36.9 C), temperature source Temporal, resp. rate 18, height 5' 8.75" (1.746 m), weight 205 lb (93 kg), SpO2 100%.  LABORATORY DATA: Lab Results  Component Value Date   WBC 9.0 12/28/2022   HGB 12.9 (L) 12/28/2022   HCT 39.7 12/28/2022   MCV 89.6 12/28/2022   PLT 261 12/28/2022      Chemistry      Component Value Date/Time    NA 138 12/28/2022 1056   K 4.4 12/28/2022 1056   CL 101 12/28/2022 1056   CO2 32 12/28/2022 1056   BUN 19 12/28/2022 1056   CREATININE 0.79 12/28/2022 1056   CREATININE 0.69 (L) 09/24/2014 0828      Component Value Date/Time   CALCIUM 10.0 12/28/2022 1056   ALKPHOS 61 12/28/2022 1056   AST 17 12/28/2022 1056   ALT 22 12/28/2022 1056   BILITOT 0.6 12/28/2022 1056       RADIOGRAPHIC STUDIES: No results found.  ASSESSMENT AND PLAN: This is a very pleasant 67 years old white male with Stage IIB (T3, N0, M0) non-small cell lung cancer, squamous cell carcinoma presented with left suprahilar mass with suspicious postobstructive pneumonitis/lymphangitic spread and satellite nodules diagnosed in November 2024.  He is currently undergoing neoadjuvant chemoimmunotherapy with carboplatin for AUC of 6, paclitaxel 200 Mg/M2 and nivolumab 360 mg IV every 3 weeks with growth factor support status post 1 cycle. He tolerated the first cycle of his treatment well except for the aching fatigue and weakness as well as mild peripheral neuropathy after the growth factor injection.    Stage IIb Non-Small Cell Lung Cancer (NSCLC) - Squamous Cell Carcinoma Diagnosed in November 2024. Undergoing neoadjuvant chemotherapy and immunotherapy with carboplatin, paclitaxel, and Nivolumab with G-CSF injection. Reports minor hemoptysis, likely treatment-related. No significant respiratory issues. Elevated WBC attributed to injection. No nausea, vomiting, or diarrhea. Neuropathy noted as a chemotherapy side effect. Discussed neoadjuvant treatment standard of care and potential for tumor regression before surgery, with some cases showing complete tumor disappearance by surgery. - Continue neoadjuvant chemotherapy and immunotherapy - Monitor for significant hemoptysis (>1 teaspoon) and report  if occurs - Evaluate for potential dose adjustment if neuropathy worsens - Follow-up CT or PET scan prior to surgery - Consult  with thoracic surgeon at [facility] for surgical perspective  Constipation Reports constipation with delayed bowel movements up to 24 hours. Managed with stool softeners. Emphasized importance of regular bowel movements to prevent hemorrhoid exacerbation. - Continue using stool softeners (Dulcolax and Swiss Cress) - Ensure regular bowel movements to prevent hemorrhoid exacerbation  Hemorrhoids Exacerbated by constipation. Recent bleeding resolved. Using stool softeners and suppositories. Emphasized preventing constipation to avoid exacerbation and potential anemia from blood loss. - Continue using stool softeners - Apply hemorrhoidal cream or use suppositories as needed as long as he is not neutropenic. - Monitor for persistent or severe bleeding  Follow-up - Follow-up appointment in two weeks for next chemotherapy cycle.   The patient was advised to call immediately if he has any concerning symptoms in the interval. The patient voices understanding of current disease status and treatment options and is in agreement with the current care plan.  All questions were answered. The patient knows to call the clinic with any problems, questions or concerns. We can certainly see the patient much sooner if necessary.  The total time spent in the appointment was 30 minutes.  Disclaimer: This note was dictated with voice recognition software. Similar sounding words can inadvertently be transcribed and may not be corrected upon review.

## 2023-01-08 ENCOUNTER — Other Ambulatory Visit: Payer: Self-pay

## 2023-01-11 ENCOUNTER — Encounter: Payer: Self-pay | Admitting: Internal Medicine

## 2023-01-12 ENCOUNTER — Telehealth (HOSPITAL_COMMUNITY): Payer: Self-pay

## 2023-01-12 NOTE — Telephone Encounter (Signed)
 Pt daughter called wanting to get pt scheduled, I advised pt daughter that I am having to fax over a paper referral for them to fill out with a correct diagnoses for pt. Pt's daughter stated that she will send over an email to Zebedee the diplomatic services operational officer to let her know to be on the lookout for the form.

## 2023-01-13 ENCOUNTER — Encounter: Payer: Self-pay | Admitting: Internal Medicine

## 2023-01-16 ENCOUNTER — Encounter: Payer: Self-pay | Admitting: Internal Medicine

## 2023-01-17 ENCOUNTER — Telehealth: Payer: Self-pay | Admitting: Medical Oncology

## 2023-01-17 ENCOUNTER — Encounter: Payer: Self-pay | Admitting: Internal Medicine

## 2023-01-17 ENCOUNTER — Other Ambulatory Visit: Payer: Medicare Other

## 2023-01-17 MED FILL — Fosaprepitant Dimeglumine For IV Infusion 150 MG (Base Eq): INTRAVENOUS | Qty: 5 | Status: AC

## 2023-01-17 NOTE — Telephone Encounter (Signed)
 Called pt regarding his mychart message on jan 3rd. He coughed up less 1 tsp sputum with light red blood , no blood clots. It happened in morning. It is stopped for 3 days.   I instructed pt to monitor for now and if he coughs up more than 1 tsp BRB  and or clots he needs to go to ED. He will keep his appt for tomorrow.

## 2023-01-18 ENCOUNTER — Inpatient Hospital Stay: Payer: HMO | Attending: Internal Medicine | Admitting: Internal Medicine

## 2023-01-18 ENCOUNTER — Inpatient Hospital Stay: Payer: HMO

## 2023-01-18 ENCOUNTER — Inpatient Hospital Stay: Payer: Self-pay

## 2023-01-18 VITALS — BP 112/70 | HR 60 | Temp 98.2°F | Resp 16 | Ht 68.75 in | Wt 209.1 lb

## 2023-01-18 DIAGNOSIS — C3412 Malignant neoplasm of upper lobe, left bronchus or lung: Secondary | ICD-10-CM

## 2023-01-18 DIAGNOSIS — Z79899 Other long term (current) drug therapy: Secondary | ICD-10-CM | POA: Diagnosis not present

## 2023-01-18 DIAGNOSIS — Z5111 Encounter for antineoplastic chemotherapy: Secondary | ICD-10-CM | POA: Insufficient documentation

## 2023-01-18 DIAGNOSIS — R110 Nausea: Secondary | ICD-10-CM | POA: Diagnosis not present

## 2023-01-18 DIAGNOSIS — Z5112 Encounter for antineoplastic immunotherapy: Secondary | ICD-10-CM | POA: Diagnosis not present

## 2023-01-18 DIAGNOSIS — Z5189 Encounter for other specified aftercare: Secondary | ICD-10-CM | POA: Insufficient documentation

## 2023-01-18 LAB — CBC WITH DIFFERENTIAL (CANCER CENTER ONLY)
Abs Immature Granulocytes: 0.04 10*3/uL (ref 0.00–0.07)
Basophils Absolute: 0.1 10*3/uL (ref 0.0–0.1)
Basophils Relative: 1 %
Eosinophils Absolute: 0.1 10*3/uL (ref 0.0–0.5)
Eosinophils Relative: 1 %
HCT: 36.9 % — ABNORMAL LOW (ref 39.0–52.0)
Hemoglobin: 12.1 g/dL — ABNORMAL LOW (ref 13.0–17.0)
Immature Granulocytes: 0 %
Lymphocytes Relative: 26 %
Lymphs Abs: 2.5 10*3/uL (ref 0.7–4.0)
MCH: 29.1 pg (ref 26.0–34.0)
MCHC: 32.8 g/dL (ref 30.0–36.0)
MCV: 88.7 fL (ref 80.0–100.0)
Monocytes Absolute: 1.3 10*3/uL — ABNORMAL HIGH (ref 0.1–1.0)
Monocytes Relative: 13 %
Neutro Abs: 5.7 10*3/uL (ref 1.7–7.7)
Neutrophils Relative %: 59 %
Platelet Count: 241 10*3/uL (ref 150–400)
RBC: 4.16 MIL/uL — ABNORMAL LOW (ref 4.22–5.81)
RDW: 14.3 % (ref 11.5–15.5)
WBC Count: 9.6 10*3/uL (ref 4.0–10.5)
nRBC: 0 % (ref 0.0–0.2)

## 2023-01-18 LAB — CMP (CANCER CENTER ONLY)
ALT: 37 U/L (ref 0–44)
AST: 20 U/L (ref 15–41)
Albumin: 4.1 g/dL (ref 3.5–5.0)
Alkaline Phosphatase: 74 U/L (ref 38–126)
Anion gap: 6 (ref 5–15)
BUN: 19 mg/dL (ref 8–23)
CO2: 29 mmol/L (ref 22–32)
Calcium: 9.6 mg/dL (ref 8.9–10.3)
Chloride: 103 mmol/L (ref 98–111)
Creatinine: 0.8 mg/dL (ref 0.61–1.24)
GFR, Estimated: >60 mL/min (ref >60)
Glucose, Bld: 102 mg/dL — ABNORMAL HIGH (ref 70–99)
Potassium: 4.8 mmol/L (ref 3.5–5.1)
Sodium: 138 mmol/L (ref 135–145)
Total Bilirubin: 0.5 mg/dL (ref 0.0–1.2)
Total Protein: 7.6 g/dL (ref 6.5–8.1)

## 2023-01-18 MED ORDER — SODIUM CHLORIDE 0.9 % IV SOLN
150.0000 mg | Freq: Once | INTRAVENOUS | Status: AC
  Administered 2023-01-18: 150 mg via INTRAVENOUS
  Filled 2023-01-18: qty 150

## 2023-01-18 MED ORDER — SODIUM CHLORIDE 0.9 % IV SOLN
723.6000 mg | Freq: Once | INTRAVENOUS | Status: AC
  Administered 2023-01-18: 720 mg via INTRAVENOUS
  Filled 2023-01-18: qty 72

## 2023-01-18 MED ORDER — DEXAMETHASONE SODIUM PHOSPHATE 10 MG/ML IJ SOLN
10.0000 mg | Freq: Once | INTRAMUSCULAR | Status: AC
Start: 2023-01-18 — End: 2023-01-18
  Administered 2023-01-18: 10 mg via INTRAVENOUS
  Filled 2023-01-18: qty 1

## 2023-01-18 MED ORDER — DIPHENHYDRAMINE HCL 50 MG/ML IJ SOLN
50.0000 mg | Freq: Once | INTRAMUSCULAR | Status: AC
  Administered 2023-01-18: 50 mg via INTRAVENOUS
  Filled 2023-01-18: qty 1

## 2023-01-18 MED ORDER — NIVOLUMAB CHEMO INJECTION 100 MG/10ML
360.0000 mg | Freq: Once | INTRAVENOUS | Status: AC
  Administered 2023-01-18: 360 mg via INTRAVENOUS
  Filled 2023-01-18: qty 24

## 2023-01-18 MED ORDER — SODIUM CHLORIDE 0.9 % IV SOLN
200.0000 mg/m2 | Freq: Once | INTRAVENOUS | Status: AC
  Administered 2023-01-18: 426 mg via INTRAVENOUS
  Filled 2023-01-18: qty 71

## 2023-01-18 MED ORDER — FAMOTIDINE IN NACL 20-0.9 MG/50ML-% IV SOLN
20.0000 mg | Freq: Once | INTRAVENOUS | Status: AC
  Administered 2023-01-18: 20 mg via INTRAVENOUS
  Filled 2023-01-18: qty 50

## 2023-01-18 MED ORDER — PALONOSETRON HCL INJECTION 0.25 MG/5ML
0.2500 mg | Freq: Once | INTRAVENOUS | Status: AC
  Administered 2023-01-18: 0.25 mg via INTRAVENOUS
  Filled 2023-01-18: qty 5

## 2023-01-18 MED ORDER — SODIUM CHLORIDE 0.9 % IV SOLN
INTRAVENOUS | Status: DC
Start: 2023-01-18 — End: 2023-01-18

## 2023-01-18 NOTE — Progress Notes (Signed)
 [facility] [phone]   [phone]  OFFICE PROGRESS NOTE  [name], DO [address] [place] [address]  DIAGNOSIS: Stage IIB (T3, N0, M0) non-small cell lung cancer, squamous cell carcinoma presented with left suprahilar mass with suspicious postobstructive pneumonitis/lymphangitic spread and satellite nodules diagnosed in November 2024.   PRIOR THERAPY: None  CURRENT THERAPY: Neoadjuvant chemoimmunotherapy with carboplatin  for AUC of 6, paclitaxel  200 Mg/M2 and nivolumab  360 Mg IV every 3 weeks with Neulasta  support for total of 4 cycles.  INTERVAL HISTORY: Raymond Velez 68 y.o. male returns to the clinic today for follow-up visit. Discussed the use of AI scribe software for clinical note transcription with the patient, who gave verbal consent to proceed.  History of Present Illness   The patient, a 68 year old diagnosed diagnosed with stage 2B non-small cell lung cancer (squamous cell carcinoma) in November 2024, is currently undergoing neoadjuvant chemoimmunotherapy with carboplatin , paclitaxel , and nivolumab . He has completed one cycle of treatment, which was generally well-tolerated. However, he experienced leg aches for three to four days following the administration of a white blood cell growth factor shot.  Concurrently, he noticed an increase in urinary frequency and required additional stool softeners for constipation. He also reported hair loss, a known side effect of the treatment. However, he denied experiencing any neuropathy, numbness, or tingling.  The patient also reported feeling more tired than usual and needing more sleep. He mentioned occasional coughing with a small amount of blood, which he was previously informed could occur. He also reported occasional use of Compazine  for nausea.       MEDICAL HISTORY: Past Medical History:  Diagnosis Date   Arthritis    Hyperlipidemia    RBBB     ALLERGIES:  has no known  allergies.  MEDICATIONS:  Current Outpatient Medications  Medication Sig Dispense Refill   atorvastatin  (LIPITOR) 20 MG tablet Take 20 mg by mouth daily.     No current facility-administered medications for this visit.    SURGICAL HISTORY:  Past Surgical History:  Procedure Laterality Date   BRONCHIAL BIOPSY  11/14/2022   Procedure: BRONCHIAL BIOPSIES;  Surgeon: Shelah Lamar RAMAN, MD;  Location: [facility] ENDOSCOPY;  Service: Pulmonary;;   BRONCHIAL BRUSHINGS  11/14/2022   Procedure: BRONCHIAL BRUSHINGS;  Surgeon: Shelah Lamar RAMAN, MD;  Location: [facility] ENDOSCOPY;  Service: Pulmonary;;   COLONOSCOPY      x 3   HEMOSTASIS CONTROL  11/14/2022   Procedure: HEMOSTASIS CONTROL;  Surgeon: Shelah Lamar RAMAN, MD;  Location: [facility] ENDOSCOPY;  Service: Pulmonary;;  epi and cold saline   TOTAL HIP ARTHROPLASTY Left 10/09/2018   Procedure: TOTAL HIP ARTHROPLASTY ANTERIOR APPROACH;  Surgeon: Ernie Cough, MD;  Location: [facility] ORS;  Service: Orthopedics;  Laterality: Left;  70 mins   VIDEO BRONCHOSCOPY N/A 11/14/2022   Procedure: VIDEO BRONCHOSCOPY;  Surgeon: Shelah Lamar RAMAN, MD;  Location: [facility] ENDOSCOPY;  Service: Pulmonary;  Laterality: N/A;    REVIEW OF SYSTEMS:  A comprehensive review of systems was negative except for: Constitutional: positive for fatigue Respiratory: positive for cough Gastrointestinal: positive for constipation Integument/breast: positive for alopecia    PHYSICAL EXAMINATION: General appearance: alert, cooperative, fatigued, and no distress Head: Normocephalic, without obvious abnormality, atraumatic Neck: no adenopathy, no JVD, supple, symmetrical, trachea midline, and thyroid  not enlarged, symmetric, no tenderness/mass/nodules Lymph nodes: Cervical, supraclavicular, and axillary nodes normal. Resp: clear to auscultation bilaterally Back: symmetric, no curvature. ROM normal. No CVA tenderness. Cardio: regular rate and rhythm,  S1, S2 normal, no murmur, click, rub or gallop GI: soft, non-tender;  bowel sounds normal; no masses,  no organomegaly Extremities: extremities normal, atraumatic, no cyanosis or edema  ECOG PERFORMANCE STATUS: 1 - Symptomatic but completely ambulatory  Blood pressure 112/70, pulse 60, temperature 98.2 F (36.8 C), temperature source Temporal, resp. rate 16, height 5' 8.75 (1.746 m), weight 209 lb 1.6 oz (94.8 kg), SpO2 99%.  LABORATORY DATA: Lab Results  Component Value Date   WBC 9.6 01/18/2023   HGB 12.1 (L) 01/18/2023   HCT 36.9 (L) 01/18/2023   MCV 88.7 01/18/2023   PLT 241 01/18/2023      Chemistry      Component Value Date/Time   NA 137 01/05/2023 1458   K 4.1 01/05/2023 1458   CL 102 01/05/2023 1458   CO2 29 01/05/2023 1458   BUN 23 01/05/2023 1458   CREATININE 1.31 (H) 01/05/2023 1458   CREATININE 0.69 (L) 09/24/2014 0828      Component Value Date/Time   CALCIUM  9.7 01/05/2023 1458   ALKPHOS 107 01/05/2023 1458   AST 25 01/05/2023 1458   ALT 44 01/05/2023 1458   BILITOT 0.3 01/05/2023 1458       RADIOGRAPHIC STUDIES: No results found.  ASSESSMENT AND PLAN: This is a very pleasant 68 years old white male white male with Stage IIB (T3, N0, M0) non-small cell lung cancer, squamous cell carcinoma presented with left suprahilar mass with suspicious postobstructive pneumonitis/lymphangitic spread and satellite nodules diagnosed in November 2024.  He is currently undergoing neoadjuvant chemoimmunotherapy with carboplatin  for AUC of 6, paclitaxel  200 Mg/M2 and nivolumab  360 mg IV every 3 weeks with growth factor support status post 1 cycle. He tolerated the first cycle of his treatment well except for the fatigue and aching pain after the Udenyca  injection.    Stage IIB Non-Small Cell Lung Cancer (NSCLC), Squamous Cell Carcinoma Diagnosed in November 2024. Undergoing neoadjuvant chemoimmunotherapy with carboplatin , paclitaxel , and nivolumab . Completed one cycle with manageable side effects: leg aches post-G-CSF shot, increased urinary frequency,  and constipation. Mild hemoptysis likely due to irritation. Blood work satisfactory for second cycle. Discussed temporary hair loss and provided reassurance. - Administer second cycle of neoadjuvant chemoimmunotherapy with carboplatin , paclitaxel , and nivolumab  - Monitor for side effects: leg aches, urinary frequency, constipation, and hemoptysis - Advise to report significant increase in hemoptysis - Schedule follow-up in three weeks for third cycle  Chemotherapy-Induced Nausea Intermittent use of Compazine  for nausea. No severe nausea or vomiting reported. - Continue Compazine  as needed for nausea - Ensure Compazine  is listed in the medication record.   The patient was advised to call immediately if he has any concerning symptoms in the interval. The patient voices understanding of current disease status and treatment options and is in agreement with the current care plan.  All questions were answered. The patient knows to call the clinic with any problems, questions or concerns. We can certainly see the patient much sooner if necessary.  The total time spent in the appointment was 20 minutes.  Disclaimer: This note was dictated with voice recognition software. Similar sounding words can inadvertently be transcribed and may not be corrected upon review.

## 2023-01-18 NOTE — Patient Instructions (Signed)
 [facility] - A DEPT OF [facility]   Discharge Instructions: Thank you for choosing [facility] to provide your oncology and hematology care.   If you have a lab appointment with the Cancer Center, please go directly to the Cancer Center and check in at the registration area.   Wear comfortable clothing and clothing appropriate for easy access to any Portacath or PICC line.   We strive to give you quality time with your provider. You may need to reschedule your appointment if you arrive late (15 or more minutes).  Arriving late affects you and other patients whose appointments are after yours.  Also, if you miss three or more appointments without notifying the office, you may be dismissed from the clinic at the provider's discretion.      For prescription refill requests, have your pharmacy contact our office and allow 72 hours for refills to be completed.    Today you received the following chemotherapy and/or immunotherapy agents: Nivolumab (Opdivo), Paclitaxel (Taxol), and Carboplatin       To help prevent nausea and vomiting after your treatment, we encourage you to take your nausea medication as directed.  BELOW ARE SYMPTOMS THAT SHOULD BE REPORTED IMMEDIATELY: *FEVER GREATER THAN 100.4 F (38 C) OR HIGHER *CHILLS OR SWEATING *NAUSEA AND VOMITING THAT IS NOT CONTROLLED WITH YOUR NAUSEA MEDICATION *UNUSUAL SHORTNESS OF BREATH *UNUSUAL BRUISING OR BLEEDING *URINARY PROBLEMS (pain or burning when urinating, or frequent urination) *BOWEL PROBLEMS (unusual diarrhea, constipation, pain near the anus) TENDERNESS IN MOUTH AND THROAT WITH OR WITHOUT PRESENCE OF ULCERS (sore throat, sores in mouth, or a toothache) UNUSUAL RASH, SWELLING OR PAIN  UNUSUAL VAGINAL DISCHARGE OR ITCHING   Items with * indicate a potential emergency and should be followed up as soon as possible or go to the Emergency Department if any problems should occur.  Please  show the CHEMOTHERAPY ALERT CARD or IMMUNOTHERAPY ALERT CARD at check-in to the Emergency Department and triage nurse.  Should you have questions after your visit or need to cancel or reschedule your appointment, please contact [facility] - A DEPT [facility]  Dept: [phone]  and follow the prompts.  Office hours are 8:00 a.m. to 4:30 p.m. Monday - Friday. Please note that voicemails left after 4:00 p.m. may not be returned until the following business day.  We are closed weekends and major holidays. You have access to a nurse at all times for urgent questions. Please call the main number to the clinic Dept: [phone] and follow the prompts.   For any non-urgent questions, you may also contact your provider using MyChart. We now offer e-Visits for anyone 41 and older to request care online for non-urgent symptoms. For details visit [email or web address].   Also download the MyChart app! Go to the app store, search "MyChart", open the app, select [facility], and log in with your MyChart username and password.

## 2023-01-20 ENCOUNTER — Telehealth: Payer: Medicare Other | Admitting: Thoracic Surgery (Cardiothoracic Vascular Surgery)

## 2023-01-20 ENCOUNTER — Encounter: Payer: Self-pay | Admitting: Internal Medicine

## 2023-01-20 ENCOUNTER — Inpatient Hospital Stay: Payer: HMO

## 2023-01-20 VITALS — BP 104/56 | HR 55 | Temp 97.8°F | Resp 18

## 2023-01-20 DIAGNOSIS — C3412 Malignant neoplasm of upper lobe, left bronchus or lung: Secondary | ICD-10-CM

## 2023-01-20 DIAGNOSIS — Z5112 Encounter for antineoplastic immunotherapy: Secondary | ICD-10-CM | POA: Diagnosis not present

## 2023-01-20 MED ORDER — PEGFILGRASTIM-FPGK 6 MG/0.6ML ~~LOC~~ SOSY
6.0000 mg | PREFILLED_SYRINGE | Freq: Once | SUBCUTANEOUS | Status: AC
Start: 2023-01-20 — End: 2023-01-20
  Administered 2023-01-20: 6 mg via SUBCUTANEOUS

## 2023-01-23 ENCOUNTER — Telehealth (HOSPITAL_COMMUNITY): Payer: Self-pay | Admitting: *Deleted

## 2023-01-23 ENCOUNTER — Telehealth (HOSPITAL_COMMUNITY): Payer: Self-pay

## 2023-01-23 NOTE — Telephone Encounter (Signed)
 Called patient to see if he was interested in participating in the Pulmonary Rehab Program. Patient stated yes. Patient will come in for orientation on 01/25/23 @ 1PM and will attend the 8:15AM exercise class. Went over insurance, patient verbalized understanding.   Pensions consultant.

## 2023-01-23 NOTE — Telephone Encounter (Signed)
 Called pt and asked him to come to pulmonary rehab orientation at 12:30 01/25/23 to ensure he can get to his other appointment in the afternoon. He agreed.  Ethelda Chick BS, ACSM-CEP 01/23/2023 3:23 PM

## 2023-01-23 NOTE — Telephone Encounter (Signed)
 Pt insurance is active and benefits verified through [employer]. Co-pay $5.00, DED $0.00/$0.00 met, out of pocket $3,400.00/$0.00 met, co-insurance 0%. No pre-authorization required. Raghuvansh/HTA, 01/22/22 @ 11:26AM, WUJ#[id number]

## 2023-01-24 ENCOUNTER — Other Ambulatory Visit: Payer: Self-pay

## 2023-01-25 ENCOUNTER — Encounter (HOSPITAL_COMMUNITY): Payer: Self-pay

## 2023-01-25 ENCOUNTER — Inpatient Hospital Stay: Payer: HMO

## 2023-01-25 ENCOUNTER — Encounter (HOSPITAL_COMMUNITY)
Admission: RE | Admit: 2023-01-25 | Discharge: 2023-01-25 | Disposition: A | Discharge status: Home / Self Care | Payer: HMO | Source: Ambulatory Visit | Attending: Pulmonary Disease | Admitting: Pulmonary Disease

## 2023-01-25 VITALS — BP 110/62 | HR 74 | Ht 68.0 in | Wt 207.0 lb

## 2023-01-25 DIAGNOSIS — Z5112 Encounter for antineoplastic immunotherapy: Secondary | ICD-10-CM | POA: Diagnosis not present

## 2023-01-25 DIAGNOSIS — C3412 Malignant neoplasm of upper lobe, left bronchus or lung: Secondary | ICD-10-CM | POA: Insufficient documentation

## 2023-01-25 LAB — CBC WITH DIFFERENTIAL (CANCER CENTER ONLY)
Abs Immature Granulocytes: 0.34 10*3/uL — ABNORMAL HIGH (ref 0.00–0.07)
Basophils Absolute: 0.2 10*3/uL — ABNORMAL HIGH (ref 0.0–0.1)
Basophils Relative: 1 %
Eosinophils Absolute: 0.3 10*3/uL (ref 0.0–0.5)
Eosinophils Relative: 2 %
HCT: 34.7 % — ABNORMAL LOW (ref 39.0–52.0)
Hemoglobin: 11.5 g/dL — ABNORMAL LOW (ref 13.0–17.0)
Immature Granulocytes: 2 %
Lymphocytes Relative: 18 %
Lymphs Abs: 3.5 10*3/uL (ref 0.7–4.0)
MCH: 29.6 pg (ref 26.0–34.0)
MCHC: 33.1 g/dL (ref 30.0–36.0)
MCV: 89.4 fL (ref 80.0–100.0)
Monocytes Absolute: 2.5 10*3/uL — ABNORMAL HIGH (ref 0.1–1.0)
Monocytes Relative: 13 %
Neutro Abs: 12.5 10*3/uL — ABNORMAL HIGH (ref 1.7–7.7)
Neutrophils Relative %: 64 %
Platelet Count: 208 10*3/uL (ref 150–400)
RBC: 3.88 MIL/uL — ABNORMAL LOW (ref 4.22–5.81)
RDW: 15.2 % (ref 11.5–15.5)
WBC Count: 19.4 10*3/uL — ABNORMAL HIGH (ref 4.0–10.5)
nRBC: 0 % (ref 0.0–0.2)

## 2023-01-25 LAB — CMP (CANCER CENTER ONLY)
ALT: 40 U/L (ref 0–44)
AST: 20 U/L (ref 15–41)
Albumin: 4.3 g/dL (ref 3.5–5.0)
Alkaline Phosphatase: 108 U/L (ref 38–126)
Anion gap: 3 — ABNORMAL LOW (ref 5–15)
BUN: 16 mg/dL (ref 8–23)
CO2: 31 mmol/L (ref 22–32)
Calcium: 9.9 mg/dL (ref 8.9–10.3)
Chloride: 102 mmol/L (ref 98–111)
Creatinine: 0.81 mg/dL (ref 0.61–1.24)
GFR, Estimated: >60 mL/min (ref >60)
Glucose, Bld: 90 mg/dL (ref 70–99)
Potassium: 4.7 mmol/L (ref 3.5–5.1)
Sodium: 136 mmol/L (ref 135–145)
Total Bilirubin: 0.3 mg/dL (ref 0.0–1.2)
Total Protein: 7.6 g/dL (ref 6.5–8.1)

## 2023-01-25 NOTE — Progress Notes (Signed)
 Raymond Velez 68 y.o. male Pulmonary Rehab Orientation Note This patient who was referred to Pulmonary Rehab by Dr. Georgine Kitchens at [facility] (Dr. Washington Hacker covering) with the diagnosis of Lung Cancer arrived today in Cardiac and Pulmonary Rehab. He arrived ambulatory with normal gait. He does not carry portable oxygen. Per patient, Raymond Velez uses oxygen never. Color good, skin warm and dry. Patient is oriented to time and place. Patient's medical history, psychosocial health, and medications reviewed. Psychosocial assessment reveals patient lives with spouse. Raymond Velez is currently retired. Patient hobbies include spending time with others. Patient reports his stress level is low. Areas of stress/anxiety include health. Patient does not exhibit signs of depression.  PHQ2/9 score 0/0. Raymond Velez shows good  coping skills with positive outlook on life. Offered emotional support and reassurance. Will continue to monitor and evaluate progress toward psychosocial goal(s) of managing his health. Physical assessment performed by Raymond Harman RN. Please see their orientation physical assessment note. Raymond Velez reports he  does take medications as prescribed. Patient states he  follows a regular  diet. The patient reports no specific efforts to gain or lose weight.. Patient's weight will be monitored closely. Demonstration and practice of PLB using pulse oximeter. Raymond Velez able to return demonstration satisfactorily. Safety and hand hygiene in the exercise area reviewed with patient. Raymond Velez voices understanding of the information reviewed. Department expectations discussed with patient and achievable goals were set. The patient shows enthusiasm about attending the program and we look forward to working with Raymond Velez. Raymond Velez completed a 6 min walk test today and is scheduled to begin exercise on 01/31/23 at 8 am.   1230-1410 Raymond Velez, BS, ACSM-CEP

## 2023-01-25 NOTE — Progress Notes (Signed)
 Pulmonary Individual Treatment Plan  Patient Details  Name: Raymond Velez MRN: [id number] Date of Birth: Jun 15, 1955 Referring Provider:   Gattis Kass Pulmonary Rehab Walk Test from 01/25/2023 in [facility]  Referring Provider Georgine Kitchens [facility]       Initial Encounter Date:  Flowsheet Row Pulmonary Rehab Walk Test from 01/25/2023 in [facility], [facility]  Date 01/25/23       Visit Diagnosis: Malignant neoplasm of upper lobe of left lung (HCC)  Patient's Home Medications on Admission:   Current Outpatient Medications:    atorvastatin  (LIPITOR) 20 MG tablet, Take 20 mg by mouth daily., Disp: , Rfl:   Past Medical History: Past Medical History:  Diagnosis Date   Arthritis    Hyperlipidemia    RBBB     Tobacco Use: Social History   Tobacco Use  Smoking Status Former   Current packs/day: 0.00   Average packs/day: 1.5 packs/day for 15.1 years (22.7 ttl pk-yrs)   Types: Cigarettes   Start date: 11/22/2002   Quit date: 11/12/2022   Years since quitting: 0.2  Smokeless Tobacco Never    Labs: Review Flowsheet       Latest Ref Rng & Units 01/19/2013 02/20/2014 09/24/2014  Labs for ITP Cardiac and Pulmonary Rehab  Cholestrol 125 - 200 mg/dL 409  811  914   LDL (calc) <130 mg/dL 782  956  213   HDL-C >=08 mg/dL 37  34  29   Trlycerides <150 mg/dL 657  846  962   Hemoglobin A1c - - 5.7  5.8     Capillary Blood Glucose: Lab Results  Component Value Date   GLUCAP 89 11/17/2022     Pulmonary Assessment Scores:  Pulmonary Assessment Scores     Row Name 01/25/23 1310         ADL UCSD   ADL Phase Entry     SOB Score total 1       CAT Score   CAT Score 3       mMRC Score   mMRC Score 2             UCSD: Self-administered rating of dyspnea associated with activities of daily living (ADLs) 6-point scale (0 = "not at all" to 5 = "maximal or  unable to do because of breathlessness")  Scoring Scores range from 0 to 120.  Minimally important difference is 5 units  CAT: CAT can identify the health impairment of COPD patients and is better correlated with disease progression.  CAT has a scoring range of zero to 40. The CAT score is classified into four groups of low (less than 10), medium (10 - 20), high (21-30) and very high (31-40) based on the impact level of disease on health status. A CAT score over 10 suggests significant symptoms.  A worsening CAT score could be explained by an exacerbation, poor medication adherence, poor inhaler technique, or progression of COPD or comorbid conditions.  CAT MCID is 2 points  mMRC: mMRC (Modified Medical Research Council) Dyspnea Scale is used to assess the degree of baseline functional disability in patients of respiratory disease due to dyspnea. No minimal important difference is established. A decrease in score of 1 point or greater is considered a positive change.   Pulmonary Function Assessment:  Pulmonary Function Assessment - 01/25/23 1410       Breath   Bilateral Breath Sounds Decreased  Exercise Target Goals: Exercise Program Goal: Individual exercise prescription set using results from initial 6 min walk test and THRR while considering  patient's activity barriers and safety.   Exercise Prescription Goal: Initial exercise prescription builds to 30-45 minutes a day of aerobic activity, 2-3 days per week.  Home exercise guidelines will be given to patient during program as part of exercise prescription that the participant will acknowledge.  Activity Barriers & Risk Stratification:  Activity Barriers & Cardiac Risk Stratification - 01/25/23 1310       Activity Barriers & Cardiac Risk Stratification   Activity Barriers Deconditioning;Shortness of Breath;Muscular Weakness    Cardiac Risk Stratification Moderate             6 Minute Walk:  6 Minute Walk      Row Name 01/25/23 1403         6 Minute Walk   Phase Initial     Distance 1524 feet     Walk Time 6 minutes     # of Rest Breaks 0     MPH 2.89     METS 3.41     RPE 11     Perceived Dyspnea  0     VO2 Peak 11.93     Symptoms No     Resting HR 74 bpm     Resting BP 110/62     Resting Oxygen Saturation  98 %     Exercise Oxygen Saturation  during 6 min walk 94 %     Max Ex. HR 111 bpm     Max Ex. BP 140/60     2 Minute Post BP 110/60       Interval HR   1 Minute HR 89     2 Minute HR --  equipment failure     3 Minute HR 111     4 Minute HR 105     5 Minute HR 100     6 Minute HR 104     2 Minute Post HR 75     Interval Heart Rate? Yes       Interval Oxygen   Interval Oxygen? Yes     Baseline Oxygen Saturation % 98 %     1 Minute Oxygen Saturation % 97 %     1 Minute Liters of Oxygen 0 L     2 Minute Oxygen Saturation % --  equipment failure     2 Minute Liters of Oxygen 0 L     3 Minute Oxygen Saturation % 95 %     3 Minute Liters of Oxygen 0 L     4 Minute Oxygen Saturation % 95 %     4 Minute Liters of Oxygen 0 L     5 Minute Oxygen Saturation % 94 %     5 Minute Liters of Oxygen 0 L     6 Minute Oxygen Saturation % 98 %     6 Minute Liters of Oxygen 0 L     2 Minute Post Oxygen Saturation % 100 %     2 Minute Post Liters of Oxygen 0 L              Oxygen Initial Assessment:  Oxygen Initial Assessment - 01/25/23 1309       Home Oxygen   Home Oxygen Device None    Sleep Oxygen Prescription None    Home Exercise Oxygen Prescription None    Home Resting Oxygen Prescription None  Compliance with Home Oxygen Use Yes      Initial 6 min Walk   Oxygen Used None      Program Oxygen Prescription   Program Oxygen Prescription None      Intervention   Short Term Goals To learn and exhibit compliance with exercise, home and travel O2 prescription;To learn and understand importance of maintaining oxygen saturations>88%;To learn and understand  importance of monitoring SPO2 with pulse oximeter and demonstrate accurate use of the pulse oximeter.;To learn and demonstrate proper pursed lip breathing techniques or other breathing techniques. ;Other    Long  Term Goals Other;Exhibits proper breathing techniques, such as pursed lip breathing or other method taught during program session;Maintenance of O2 saturations>88%;Verbalizes importance of monitoring SPO2 with pulse oximeter and return demonstration;Exhibits compliance with exercise, home  and travel O2 prescription             Oxygen Re-Evaluation:   Oxygen Discharge (Final Oxygen Re-Evaluation):   Initial Exercise Prescription:  Initial Exercise Prescription - 01/25/23 1400       Date of Initial Exercise RX and Referring Provider   Date 01/25/23    Referring Provider Georgine Kitchens [facility])    Expected Discharge Date 04/20/23      Treadmill   MPH 2.2    Grade 0    Minutes 15    METs 3      Bike   Level 4    Minutes 15    METs 3      Prescription Details   Frequency (times per week) 2    Duration Progress to 30 minutes of continuous aerobic without signs/symptoms of physical distress      Intensity   THRR 40-80% of Max Heartrate 62-123    Ratings of Perceived Exertion 11-13    Perceived Dyspnea 0-4      Progression   Progression Continue to progress workloads to maintain intensity without signs/symptoms of physical distress.      Resistance Training   Training Prescription Yes    Weight black bands (7.3 lbs)    Reps 10-15             Perform Capillary Blood Glucose checks as needed.  Exercise Prescription Changes:   Exercise Comments:   Exercise Goals and Review:   Exercise Goals     Row Name 01/25/23 1312             Exercise Goals   Increase Physical Activity Yes       Intervention Provide advice, education, support and counseling about physical activity/exercise needs.;Develop an individualized exercise prescription for aerobic and  resistive training based on initial evaluation findings, risk stratification, comorbidities and participant's personal goals.       Expected Outcomes Short Term: Attend rehab on a regular basis to increase amount of physical activity.;Long Term: Add in home exercise to make exercise part of routine and to increase amount of physical activity.;Long Term: Exercising regularly at least 3-5 days a week.       Increase Strength and Stamina Yes       Intervention Provide advice, education, support and counseling about physical activity/exercise needs.;Develop an individualized exercise prescription for aerobic and resistive training based on initial evaluation findings, risk stratification, comorbidities and participant's personal goals.       Expected Outcomes Short Term: Increase workloads from initial exercise prescription for resistance, speed, and METs.;Short Term: Perform resistance training exercises routinely during rehab and add in resistance training at home;Long Term: Improve cardiorespiratory fitness, muscular endurance  and strength as measured by increased METs and functional capacity ( )       Able to understand and use rate of perceived exertion (RPE) scale Yes       Intervention Provide education and explanation on how to use RPE scale       Expected Outcomes Short Term: Able to use RPE daily in rehab to express subjective intensity level;Long Term:  Able to use RPE to guide intensity level when exercising independently       Able to understand and use Dyspnea scale Yes       Intervention Provide education and explanation on how to use Dyspnea scale       Expected Outcomes Short Term: Able to use Dyspnea scale daily in rehab to express subjective sense of shortness of breath during exertion;Long Term: Able to use Dyspnea scale to guide intensity level when exercising independently       Knowledge and understanding of Target Heart Rate Range (THRR) Yes       Intervention Provide education and  explanation of THRR including how the numbers were predicted and where they are located for reference       Expected Outcomes Short Term: Able to state/look up THRR;Long Term: Able to use THRR to govern intensity when exercising independently;Short Term: Able to use daily as guideline for intensity in rehab       Understanding of Exercise Prescription Yes       Intervention Provide education, explanation, and written materials on patient's individual exercise prescription       Expected Outcomes Short Term: Able to explain program exercise prescription;Long Term: Able to explain home exercise prescription to exercise independently                Exercise Goals Re-Evaluation :   Discharge Exercise Prescription (Final Exercise Prescription Changes):   Nutrition:  Target Goals: Understanding of nutrition guidelines, daily intake of sodium 1500mg , cholesterol 200mg , calories 30% from fat and 7% or less from saturated fats, daily to have 5 or more servings of fruits and vegetables.  Biometrics:    Nutrition Therapy Plan and Nutrition Goals:   Nutrition Assessments:  MEDIFICTS Score Key: >=70 Need to make dietary changes  40-70 Heart Healthy Diet <= 40 Therapeutic Level Cholesterol Diet   Picture Your Plate Scores: <30 Unhealthy dietary pattern with much room for improvement. 41-50 Dietary pattern unlikely to meet recommendations for good health and room for improvement. 51-60 More healthful dietary pattern, with some room for improvement.  >60 Healthy dietary pattern, although there may be some specific behaviors that could be improved.    Nutrition Goals Re-Evaluation:   Nutrition Goals Discharge (Final Nutrition Goals Re-Evaluation):   Psychosocial: Target Goals: Acknowledge presence or absence of significant depression and/or stress, maximize coping skills, provide positive support system. Participant is able to verbalize types and ability to use techniques and  skills needed for reducing stress and depression.  Initial Review & Psychosocial Screening:  Initial Psych Review & Screening - 01/25/23 1303       Initial Review   Current issues with None Identified      Family Dynamics   Good Support System? Yes    Comments Ygnacio Hemming, wife. Daughter Sterling Eisenmenger, son      Barriers   Psychosocial barriers to participate in program The patient should benefit from training in stress management and relaxation.      Screening Interventions   Interventions Encouraged to exercise    Expected Outcomes Short Term goal:  Utilizing psychosocial counselor, staff and physician to assist with identification of specific Stressors or current issues interfering with healing process. Setting desired goal for each stressor or current issue identified.;Long Term Goal: Stressors or current issues are controlled or eliminated.;Short Term goal: Identification and review with participant of any Quality of Life or Depression concerns found by scoring the questionnaire.             Quality of Life Scores:  Scores of 19 and below usually indicate a poorer quality of life in these areas.  A difference of  2-3 points is a clinically meaningful difference.  A difference of 2-3 points in the total score of the Quality of Life Index has been associated with significant improvement in overall quality of life, self-image, physical symptoms, and general health in studies assessing change in quality of life.  PHQ-9: Review Flowsheet       01/25/2023 09/24/2014  Depression screen PHQ 2/9  Decreased Interest 0 0  Down, Depressed, Hopeless 0 0  PHQ - 2 Score 0 0  Altered sleeping 0 -  Tired, decreased energy 0 -  Change in appetite 0 -  Feeling bad or failure about yourself  0 -  Trouble concentrating 0 -  Moving slowly or fidgety/restless 0 -  Suicidal thoughts 0 -  PHQ-9 Score 0 -  Difficult doing work/chores Not difficult at all -   Interpretation of Total Score  Total Score  Depression Severity:  1-4 = Minimal depression, 5-9 = Mild depression, 10-14 = Moderate depression, 15-19 = Moderately severe depression, 20-27 = Severe depression   Psychosocial Evaluation and Intervention:  Psychosocial Evaluation - 01/25/23 1305       Psychosocial Evaluation & Interventions   Interventions Encouraged to exercise with the program and follow exercise prescription    Expected Outcomes For pt to participate in program    Continue Psychosocial Services  Follow up required by staff             Psychosocial Re-Evaluation:   Psychosocial Discharge (Final Psychosocial Re-Evaluation):   Education: Education Goals: Education classes will be provided on a weekly basis, covering required topics. Participant will state understanding/return demonstration of topics presented.  Learning Barriers/Preferences:  Learning Barriers/Preferences - 01/25/23 1306       Learning Barriers/Preferences   Learning Barriers None    Learning Preferences Audio             Education Topics: Know Your Numbers Group instruction that is supported by a PowerPoint presentation. Instructor discusses importance of knowing and understanding resting, exercise, and post-exercise oxygen saturation, heart rate, and blood pressure. Oxygen saturation, heart rate, blood pressure, rating of perceived exertion, and dyspnea are reviewed along with a normal range for these values.    Exercise for the Pulmonary Patient Group instruction that is supported by a PowerPoint presentation. Instructor discusses benefits of exercise, core components of exercise, frequency, duration, and intensity of an exercise routine, importance of utilizing pulse oximetry during exercise, safety while exercising, and options of places to exercise outside of rehab.    MET Level  Group instruction provided by PowerPoint, verbal discussion, and written material to support subject matter. Instructor reviews what METs are and  how to increase METs.    Pulmonary Medications Verbally interactive group education provided by instructor with focus on inhaled medications and proper administration.   Anatomy and Physiology of the Respiratory System Group instruction provided by PowerPoint, verbal discussion, and written material to support subject matter. Instructor reviews respiratory  cycle and anatomical components of the respiratory system and their functions. Instructor also reviews differences in obstructive and restrictive respiratory diseases with examples of each.    Oxygen Safety Group instruction provided by PowerPoint, verbal discussion, and written material to support subject matter. There is an overview of "What is Oxygen" and "Why do we need it".  Instructor also reviews how to create a safe environment for oxygen use, the importance of using oxygen as prescribed, and the risks of noncompliance. There is a brief discussion on traveling with oxygen and resources the patient may utilize.   Oxygen Use Group instruction provided by PowerPoint, verbal discussion, and written material to discuss how supplemental oxygen is prescribed and different types of oxygen supply systems. Resources for more information are provided.    Breathing Techniques Group instruction that is supported by demonstration and informational handouts. Instructor discusses the benefits of pursed lip and diaphragmatic breathing and detailed demonstration on how to perform both.     Risk Factor Reduction Group instruction that is supported by a PowerPoint presentation. Instructor discusses the definition of a risk factor, different risk factors for pulmonary disease, and how the heart and lungs work together.   Pulmonary Diseases Group instruction provided by PowerPoint, verbal discussion, and written material to support subject matter. Instructor gives an overview of the different type of pulmonary diseases. There is also a discussion on  risk factors and symptoms as well as ways to manage the diseases.   Stress and Energy Conservation Group instruction provided by PowerPoint, verbal discussion, and written material to support subject matter. Instructor gives an overview of stress and the impact it can have on the body. Instructor also reviews ways to reduce stress. There is also a discussion on energy conservation and ways to conserve energy throughout the day.   Warning Signs and Symptoms Group instruction provided by PowerPoint, verbal discussion, and written material to support subject matter. Instructor reviews warning signs and symptoms of stroke, heart attack, cold and flu. Instructor also reviews ways to prevent the spread of infection.   Other Education Group or individual verbal, written, or video instructions that support the educational goals of the pulmonary rehab program.    Knowledge Questionnaire Score:  Knowledge Questionnaire Score - 01/25/23 1327       Knowledge Questionnaire Score   Pre Score 11/18             Core Components/Risk Factors/Patient Goals at Admission:  Personal Goals and Risk Factors at Admission - 01/25/23 1307       Core Components/Risk Factors/Patient Goals on Admission   Improve shortness of breath with ADL's Yes    Intervention Provide education, individualized exercise plan and daily activity instruction to help decrease symptoms of SOB with activities of daily living.    Expected Outcomes Short Term: Improve cardiorespiratory fitness to achieve a reduction of symptoms when performing ADLs;Long Term: Be able to perform more ADLs without symptoms or delay the onset of symptoms             Core Components/Risk Factors/Patient Goals Review:    Core Components/Risk Factors/Patient Goals at Discharge (Final Review):    ITP Comments:   Comments: Dr. Genetta Kenning is Medical Director for Pulmonary Rehab at [facility]

## 2023-01-31 ENCOUNTER — Encounter (HOSPITAL_COMMUNITY)
Admission: RE | Admit: 2023-01-31 | Discharge: 2023-01-31 | Disposition: A | Discharge status: Home / Self Care | Payer: HMO | Source: Ambulatory Visit | Attending: Pulmonary Disease | Admitting: Pulmonary Disease

## 2023-01-31 VITALS — Wt 206.6 lb

## 2023-01-31 DIAGNOSIS — Z5112 Encounter for antineoplastic immunotherapy: Secondary | ICD-10-CM | POA: Diagnosis not present

## 2023-01-31 DIAGNOSIS — C3412 Malignant neoplasm of upper lobe, left bronchus or lung: Secondary | ICD-10-CM

## 2023-01-31 NOTE — Progress Notes (Signed)
Daily Session Note  Patient Details  Name: Raymond Velez MRN: [id number] Date of Birth: 1955/06/01 Referring Provider:   Doristine Devoid Pulmonary Rehab Walk Test from 01/25/2023 in [facility]  Referring Provider [name]       Encounter Date: 01/31/2023  Check In:  Session Check In - 01/31/23 4098       Check-In   Supervising physician immediately available to respond to emergencies [facility] MD immediately available    Physician(s) Bernadene Person, NP    Location [facility]    Staff Present Raford Pitcher, MS, ACSM-CEP, Exercise Physiologist;Casey Katrinka Blazing, RT    Virtual Visit No    Medication changes reported     No    Fall or balance concerns reported    No    Tobacco Cessation No Change    Warm-up and Cool-down Performed as group-led instruction   orientation only   Resistance Training Performed Yes    VAD Patient? No    PAD/SET Patient? No      Pain Assessment   Currently in Pain? No/denies    Multiple Pain Sites No             Capillary Blood Glucose: No results found for this or any previous visit (from the past 24 hours).   Exercise Prescription Changes - 01/31/23 0900       Response to Exercise   Blood Pressure (Admit) 112/62    Blood Pressure (Exercise) 142/78    Blood Pressure (Exit) 108/70    Heart Rate (Admit) 65 bpm    Heart Rate (Exercise) 114 bpm    Heart Rate (Exit) 84 bpm    Oxygen Saturation (Admit) 98 %    Oxygen Saturation (Exercise) 95 %    Oxygen Saturation (Exit) 96 %    Rating of Perceived Exertion (Exercise) 11    Perceived Dyspnea (Exercise) 0    Duration Continue with 30 min of aerobic exercise without signs/symptoms of physical distress.    Intensity THRR unchanged      Progression   Progression Continue to progress workloads to maintain intensity without signs/symptoms of physical distress.      Resistance Training   Training Prescription Yes     Weight black bands    Reps 10-15    Time 10 Minutes      Treadmill   MPH 2.7    Grade 0    Minutes 15    METs 2.9      Bike   Level 4    Minutes 15    METs 4.4             Social History   Tobacco Use  Smoking Status Former   Current packs/day: 0.00   Average packs/day: 1.5 packs/day for 15.1 years (22.7 ttl pk-yrs)   Types: Cigarettes   Start date: 11/22/2002   Quit date: 11/12/2022   Years since quitting: 0.2  Smokeless Tobacco Never    Goals Met:  Proper associated with RPD/PD & O2 Sat Exercise tolerated well No report of concerns or symptoms today Strength training completed today  Goals Unmet:  Not Applicable  Comments: Service time is from 0803 to 0930.    Dr. Mechele Collin is Medical Director for Pulmonary Rehab at [facility].

## 2023-02-01 ENCOUNTER — Inpatient Hospital Stay: Payer: HMO

## 2023-02-01 DIAGNOSIS — C3412 Malignant neoplasm of upper lobe, left bronchus or lung: Secondary | ICD-10-CM

## 2023-02-01 DIAGNOSIS — Z5112 Encounter for antineoplastic immunotherapy: Secondary | ICD-10-CM | POA: Diagnosis not present

## 2023-02-01 LAB — CBC WITH DIFFERENTIAL (CANCER CENTER ONLY)
Abs Immature Granulocytes: 0.12 10*3/uL — ABNORMAL HIGH (ref 0.00–0.07)
Basophils Absolute: 0.1 10*3/uL (ref 0.0–0.1)
Basophils Relative: 0 %
Eosinophils Absolute: 0.1 10*3/uL (ref 0.0–0.5)
Eosinophils Relative: 1 %
HCT: 34.4 % — ABNORMAL LOW (ref 39.0–52.0)
Hemoglobin: 11.2 g/dL — ABNORMAL LOW (ref 13.0–17.0)
Immature Granulocytes: 1 %
Lymphocytes Relative: 12 %
Lymphs Abs: 1.6 10*3/uL (ref 0.7–4.0)
MCH: 29.7 pg (ref 26.0–34.0)
MCHC: 32.6 g/dL (ref 30.0–36.0)
MCV: 91.2 fL (ref 80.0–100.0)
Monocytes Absolute: 1 10*3/uL (ref 0.1–1.0)
Monocytes Relative: 7 %
Neutro Abs: 10.3 10*3/uL — ABNORMAL HIGH (ref 1.7–7.7)
Neutrophils Relative %: 79 %
Platelet Count: 140 10*3/uL — ABNORMAL LOW (ref 150–400)
RBC: 3.77 MIL/uL — ABNORMAL LOW (ref 4.22–5.81)
RDW: 15.4 % (ref 11.5–15.5)
WBC Count: 13.1 10*3/uL — ABNORMAL HIGH (ref 4.0–10.5)
nRBC: 0 % (ref 0.0–0.2)

## 2023-02-01 LAB — CMP (CANCER CENTER ONLY)
ALT: 27 U/L (ref 0–44)
AST: 15 U/L (ref 15–41)
Albumin: 4.1 g/dL (ref 3.5–5.0)
Alkaline Phosphatase: 77 U/L (ref 38–126)
Anion gap: 5 (ref 5–15)
BUN: 13 mg/dL (ref 8–23)
CO2: 30 mmol/L (ref 22–32)
Calcium: 9.5 mg/dL (ref 8.9–10.3)
Chloride: 100 mmol/L (ref 98–111)
Creatinine: 0.89 mg/dL (ref 0.61–1.24)
GFR, Estimated: >60 mL/min (ref >60)
Glucose, Bld: 146 mg/dL — ABNORMAL HIGH (ref 70–99)
Potassium: 4 mmol/L (ref 3.5–5.1)
Sodium: 135 mmol/L (ref 135–145)
Total Bilirubin: 0.7 mg/dL (ref 0.0–1.2)
Total Protein: 7.6 g/dL (ref 6.5–8.1)

## 2023-02-01 NOTE — Progress Notes (Signed)
Pulmonary Individual Treatment Plan  Patient Details  Name: Jesusalberto Greiss MRN: [id number] Date of Birth: 29-Jan-1955 Referring Provider:   Doristine Devoid Pulmonary Rehab Walk Test from 01/25/2023 in [facility]  Referring Provider Mikel Cella [place])       Initial Encounter Date:  Flowsheet Row Pulmonary Rehab Walk Test from 01/25/2023 in [facility] for Heart, Vascular, [facility] Lung [facility]  Date 01/25/23       Visit Diagnosis: Malignant neoplasm of upper lobe of left lung (HCC)  Patient's Home Medications on Admission:   Current Outpatient Medications:    atorvastatin (LIPITOR) 20 MG tablet, Take 20 mg by mouth daily., Disp: , Rfl:   Past Medical History: Past Medical History:  Diagnosis Date   Arthritis    Hyperlipidemia    RBBB     Tobacco Use: Social History   Tobacco Use  Smoking Status Former   Current packs/day: 0.00   Average packs/day: 1.5 packs/day for 15.1 years (22.7 ttl pk-yrs)   Types: Cigarettes   Start date: 11/22/2002   Quit date: 11/12/2022   Years since quitting: 0.2  Smokeless Tobacco Never    Labs: Review Flowsheet       Latest Ref Rng & Units 01/19/2013 02/20/2014 09/24/2014  Labs for ITP Cardiac and Pulmonary Rehab  Cholestrol 125 - 200 mg/dL 725  366  440   LDL (calc) <130 mg/dL 347  425  956   HDL-C >=38 mg/dL 37  34  29   Trlycerides <150 mg/dL 756  433  295   Hemoglobin A1c - - 5.7  5.8     Capillary Blood Glucose: Lab Results  Component Value Date   GLUCAP 89 11/17/2022     Pulmonary Assessment Scores:  Pulmonary Assessment Scores     Row Name 01/25/23 1310         ADL UCSD   ADL Phase Entry     SOB Score total 1       CAT Score   CAT Score 3       mMRC Score   mMRC Score 2             UCSD: Self-administered rating of dyspnea associated with activities of daily living (ADLs) 6-point scale (0 = "not at all" to 5 = "maximal or  unable to do because of breathlessness")  Scoring Scores range from 0 to 120.  Minimally important difference is 5 units  CAT: CAT can identify the health impairment of COPD patients and is better correlated with disease progression.  CAT has a scoring range of zero to 40. The CAT score is classified into four groups of low (less than 10), medium (10 - 20), high (21-30) and very high (31-40) based on the impact level of disease on health status. A CAT score over 10 suggests significant symptoms.  A worsening CAT score could be explained by an exacerbation, poor medication adherence, poor inhaler technique, or progression of COPD or comorbid conditions.  CAT MCID is 2 points  mMRC: mMRC (Modified Medical Research Council) Dyspnea Scale is used to assess the degree of baseline functional disability in patients of respiratory disease due to dyspnea. No minimal important difference is established. A decrease in score of 1 point or greater is considered a positive change.   Pulmonary Function Assessment:  Pulmonary Function Assessment - 01/25/23 1410       Breath   Bilateral Breath Sounds Decreased  Exercise Target Goals: Exercise Program Goal: Individual exercise prescription set using results from initial 6 min walk test and THRR while considering  patient's activity barriers and safety.   Exercise Prescription Goal: Initial exercise prescription builds to 30-45 minutes a day of aerobic activity, 2-3 days per week.  Home exercise guidelines will be given to patient during program as part of exercise prescription that the participant will acknowledge.  Activity Barriers & Risk Stratification:  Activity Barriers & Cardiac Risk Stratification - 01/25/23 1310       Activity Barriers & Cardiac Risk Stratification   Activity Barriers Deconditioning;Shortness of Breath;Muscular Weakness    Cardiac Risk Stratification Moderate             6 Minute Walk:  6 Minute Walk      Row Name 01/25/23 1403         6 Minute Walk   Phase Initial     Distance 1524 feet     Walk Time 6 minutes     # of Rest Breaks 0     MPH 2.89     METS 3.41     RPE 11     Perceived Dyspnea  0     VO2 Peak 11.93     Symptoms No     Resting HR 74 bpm     Resting BP 110/62     Resting Oxygen Saturation  98 %     Exercise Oxygen Saturation  during 6 min walk 94 %     Max Ex. HR 111 bpm     Max Ex. BP 140/60     2 Minute Post BP 110/60       Interval HR   1 Minute HR 89     2 Minute HR --  equipment failure     3 Minute HR 111     4 Minute HR 105     5 Minute HR 100     6 Minute HR 104     2 Minute Post HR 75     Interval Heart Rate? Yes       Interval Oxygen   Interval Oxygen? Yes     Baseline Oxygen Saturation % 98 %     1 Minute Oxygen Saturation % 97 %     1 Minute Liters of Oxygen 0 L     2 Minute Oxygen Saturation % --  equipment failure     2 Minute Liters of Oxygen 0 L     3 Minute Oxygen Saturation % 95 %     3 Minute Liters of Oxygen 0 L     4 Minute Oxygen Saturation % 95 %     4 Minute Liters of Oxygen 0 L     5 Minute Oxygen Saturation % 94 %     5 Minute Liters of Oxygen 0 L     6 Minute Oxygen Saturation % 98 %     6 Minute Liters of Oxygen 0 L     2 Minute Post Oxygen Saturation % 100 %     2 Minute Post Liters of Oxygen 0 L              Oxygen Initial Assessment:  Oxygen Initial Assessment - 01/25/23 1309       Home Oxygen   Home Oxygen Device None    Sleep Oxygen Prescription None    Home Exercise Oxygen Prescription None    Home Resting Oxygen Prescription None  Compliance with Home Oxygen Use Yes      Initial 6 min Walk   Oxygen Used None      Program Oxygen Prescription   Program Oxygen Prescription None      Intervention   Short Term Goals To learn and exhibit compliance with exercise, home and travel O2 prescription;To learn and understand importance of maintaining oxygen saturations>88%;To learn and understand  importance of monitoring SPO2 with pulse oximeter and demonstrate accurate use of the pulse oximeter.;To learn and demonstrate proper pursed lip breathing techniques or other breathing techniques. ;Other    Long  Term Goals Other;Exhibits proper breathing techniques, such as pursed lip breathing or other method taught during program session;Maintenance of O2 saturations>88%;Verbalizes importance of monitoring SPO2 with pulse oximeter and return demonstration;Exhibits compliance with exercise, home  and travel O2 prescription             Oxygen Re-Evaluation:  Oxygen Re-Evaluation     Row Name 01/25/23 1429             Program Oxygen Prescription   Program Oxygen Prescription None         Home Oxygen   Home Oxygen Device None       Sleep Oxygen Prescription None       Home Exercise Oxygen Prescription None       Home Resting Oxygen Prescription None       Compliance with Home Oxygen Use Yes         Goals/Expected Outcomes   Short Term Goals To learn and exhibit compliance with exercise, home and travel O2 prescription;To learn and understand importance of maintaining oxygen saturations>88%;To learn and understand importance of monitoring SPO2 with pulse oximeter and demonstrate accurate use of the pulse oximeter.;To learn and demonstrate proper pursed lip breathing techniques or other breathing techniques. ;Other       Long  Term Goals Other;Exhibits proper breathing techniques, such as pursed lip breathing or other method taught during program session;Maintenance of O2 saturations>88%;Verbalizes importance of monitoring SPO2 with pulse oximeter and return demonstration;Exhibits compliance with exercise, home  and travel O2 prescription       Goals/Expected Outcomes Compliance and understanding of oxygen saturation monitoring and breathing techniques to decrease shortness of breath                Oxygen Discharge (Final Oxygen Re-Evaluation):  Oxygen Re-Evaluation - 01/25/23  1429       Program Oxygen Prescription   Program Oxygen Prescription None      Home Oxygen   Home Oxygen Device None    Sleep Oxygen Prescription None    Home Exercise Oxygen Prescription None    Home Resting Oxygen Prescription None    Compliance with Home Oxygen Use Yes      Goals/Expected Outcomes   Short Term Goals To learn and exhibit compliance with exercise, home and travel O2 prescription;To learn and understand importance of maintaining oxygen saturations>88%;To learn and understand importance of monitoring SPO2 with pulse oximeter and demonstrate accurate use of the pulse oximeter.;To learn and demonstrate proper pursed lip breathing techniques or other breathing techniques. ;Other    Long  Term Goals Other;Exhibits proper breathing techniques, such as pursed lip breathing or other method taught during program session;Maintenance of O2 saturations>88%;Verbalizes importance of monitoring SPO2 with pulse oximeter and return demonstration;Exhibits compliance with exercise, home  and travel O2 prescription    Goals/Expected Outcomes Compliance and understanding of oxygen saturation monitoring and breathing techniques to decrease shortness of breath  Initial Exercise Prescription:  Initial Exercise Prescription - 01/25/23 1400       Date of Initial Exercise RX and Referring Provider   Date 01/25/23    Referring Provider [name]    Expected Discharge Date 04/20/23      Treadmill   MPH 2.2    Grade 0    Minutes 15    METs 3      Bike   Level 4    Minutes 15    METs 3      Prescription Details   Frequency (times per week) 2    Duration Progress to 30 minutes of continuous aerobic without signs/symptoms of physical distress      Intensity   THRR 40-80% of Max Heartrate 62-123    Ratings of Perceived Exertion 11-13    Perceived Dyspnea 0-4      Progression   Progression Continue to progress workloads to maintain intensity without  signs/symptoms of physical distress.      Resistance Training   Training Prescription Yes    Weight black bands (7.3 lbs)    Reps 10-15             Perform Capillary Blood Glucose checks as needed.  Exercise Prescription Changes:   Exercise Prescription Changes     Row Name 01/31/23 0900             Response to Exercise   Blood Pressure (Admit) 112/62       Blood Pressure (Exercise) 142/78       Blood Pressure (Exit) 108/70       Heart Rate (Admit) 65 bpm       Heart Rate (Exercise) 114 bpm       Heart Rate (Exit) 84 bpm       Oxygen Saturation (Admit) 98 %       Oxygen Saturation (Exercise) 95 %       Oxygen Saturation (Exit) 96 %       Rating of Perceived Exertion (Exercise) 11       Perceived Dyspnea (Exercise) 0       Duration Continue with 30 min of aerobic exercise without signs/symptoms of physical distress.       Intensity THRR unchanged         Progression   Progression Continue to progress workloads to maintain intensity without signs/symptoms of physical distress.         Resistance Training   Training Prescription Yes       Weight black bands       Reps 10-15       Time 10 Minutes         Treadmill   MPH 2.7       Grade 0       Minutes 15       METs 2.9         Bike   Level 4       Minutes 15       METs 4.4                Exercise Comments:   Exercise Comments     Row Name 01/31/23 0836           Exercise Comments Reinhold completed his first day of exericse. He exercised for 15 min on the upright bike and treadmill. Deniel averaged 4.4 METs at level 4 on the upright bike and 2.7 METs at 2.7 mph on the treadmill. He performed the warmup and  cooldown standing without any limitations. Discussed METs.                Exercise Goals and Review:   Exercise Goals     Row Name 01/25/23 1312             Exercise Goals   Increase Physical Activity Yes       Intervention Provide advice, education, support and counseling about  physical activity/exercise needs.;Develop an individualized exercise prescription for aerobic and resistive training based on initial evaluation findings, risk stratification, comorbidities and participant's personal goals.       Expected Outcomes Short Term: Attend rehab on a regular basis to increase amount of physical activity.;Long Term: Add in home exercise to make exercise part of routine and to increase amount of physical activity.;Long Term: Exercising regularly at least 3-5 days a week.       Increase Strength and Stamina Yes       Intervention Provide advice, education, support and counseling about physical activity/exercise needs.;Develop an individualized exercise prescription for aerobic and resistive training based on initial evaluation findings, risk stratification, comorbidities and participant's personal goals.       Expected Outcomes Short Term: Increase workloads from initial exercise prescription for resistance, speed, and METs.;Short Term: Perform resistance training exercises routinely during rehab and add in resistance training at home;Long Term: Improve cardiorespiratory fitness, muscular endurance and strength as measured by increased METs and functional capacity ( )       Able to understand and use rate of perceived exertion (RPE) scale Yes       Intervention Provide education and explanation on how to use RPE scale       Expected Outcomes Short Term: Able to use RPE daily in rehab to express subjective intensity level;Long Term:  Able to use RPE to guide intensity level when exercising independently       Able to understand and use Dyspnea scale Yes       Intervention Provide education and explanation on how to use Dyspnea scale       Expected Outcomes Short Term: Able to use Dyspnea scale daily in rehab to express subjective sense of shortness of breath during exertion;Long Term: Able to use Dyspnea scale to guide intensity level when exercising independently       Knowledge  and understanding of Target Heart Rate Range (THRR) Yes       Intervention Provide education and explanation of THRR including how the numbers were predicted and where they are located for reference       Expected Outcomes Short Term: Able to state/look up THRR;Long Term: Able to use THRR to govern intensity when exercising independently;Short Term: Able to use daily as guideline for intensity in rehab       Understanding of Exercise Prescription Yes       Intervention Provide education, explanation, and written materials on patient's individual exercise prescription       Expected Outcomes Short Term: Able to explain program exercise prescription;Long Term: Able to explain home exercise prescription to exercise independently                Exercise Goals Re-Evaluation :  Exercise Goals Re-Evaluation     Row Name 01/25/23 1428             Exercise Goal Re-Evaluation   Exercise Goals Review Increase Physical Activity;Able to understand and use Dyspnea scale;Understanding of Exercise Prescription;Increase Strength and Stamina;Knowledge and understanding of Target Heart Rate Range (THRR);Able to  understand and use rate of perceived exertion (RPE) scale       Comments Pt to begin exercise 1/21. Will monitor for progression.       Expected Outcomes Through exercise at rehab and at home, the patient will decrease shortness of breath with daily activities and feel confident in carrying out an exercise regime at home.                Discharge Exercise Prescription (Final Exercise Prescription Changes):  Exercise Prescription Changes - 01/31/23 0900       Response to Exercise   Blood Pressure (Admit) 112/62    Blood Pressure (Exercise) 142/78    Blood Pressure (Exit) 108/70    Heart Rate (Admit) 65 bpm    Heart Rate (Exercise) 114 bpm    Heart Rate (Exit) 84 bpm    Oxygen Saturation (Admit) 98 %    Oxygen Saturation (Exercise) 95 %    Oxygen Saturation (Exit) 96 %    Rating of  Perceived Exertion (Exercise) 11    Perceived Dyspnea (Exercise) 0    Duration Continue with 30 min of aerobic exercise without signs/symptoms of physical distress.    Intensity THRR unchanged      Progression   Progression Continue to progress workloads to maintain intensity without signs/symptoms of physical distress.      Resistance Training   Training Prescription Yes    Weight black bands    Reps 10-15    Time 10 Minutes      Treadmill   MPH 2.7    Grade 0    Minutes 15    METs 2.9      Bike   Level 4    Minutes 15    METs 4.4             Nutrition:  Target Goals: Understanding of nutrition guidelines, daily intake of sodium 1500mg , cholesterol 200mg , calories 30% from fat and 7% or less from saturated fats, daily to have 5 or more servings of fruits and vegetables.  Biometrics:    Nutrition Therapy Plan and Nutrition Goals:   Nutrition Assessments:  MEDIFICTS Score Key: >=70 Need to make dietary changes  40-70 Heart Healthy Diet <= 40 Therapeutic Level Cholesterol Diet   Picture Your Plate Scores: <16 Unhealthy dietary pattern with much room for improvement. 41-50 Dietary pattern unlikely to meet recommendations for good health and room for improvement. 51-60 More healthful dietary pattern, with some room for improvement.  >60 Healthy dietary pattern, although there may be some specific behaviors that could be improved.    Nutrition Goals Re-Evaluation:   Nutrition Goals Discharge (Final Nutrition Goals Re-Evaluation):   Psychosocial: Target Goals: Acknowledge presence or absence of significant depression and/or stress, maximize coping skills, provide positive support system. Participant is able to verbalize types and ability to use techniques and skills needed for reducing stress and depression.  Initial Review & Psychosocial Screening:  Initial Psych Review & Screening - 01/25/23 1303       Initial Review   Current issues with None  Identified      Family Dynamics   Good Support System? Yes    Comments Candida Peeling, wife. Daughter [name], son      Barriers   Psychosocial barriers to participate in program The patient should benefit from training in stress management and relaxation.      Screening Interventions   Interventions Encouraged to exercise    Expected Outcomes Short Term goal: Utilizing psychosocial counselor, staff and  physician to assist with identification of specific Stressors or current issues interfering with healing process. Setting desired goal for each stressor or current issue identified.;Long Term Goal: Stressors or current issues are controlled or eliminated.;Short Term goal: Identification and review with participant of any Quality of Life or Depression concerns found by scoring the questionnaire.             Quality of Life Scores:  Scores of 19 and below usually indicate a poorer quality of life in these areas.  A difference of  2-3 points is a clinically meaningful difference.  A difference of 2-3 points in the total score of the Quality of Life Index has been associated with significant improvement in overall quality of life, self-image, physical symptoms, and general health in studies assessing change in quality of life.  PHQ-9: Review Flowsheet       01/25/2023 09/24/2014  Depression screen PHQ 2/9  Decreased Interest 0 0  Down, Depressed, Hopeless 0 0  PHQ - 2 Score 0 0  Altered sleeping 0 -  Tired, decreased energy 0 -  Change in appetite 0 -  Feeling bad or failure about yourself  0 -  Trouble concentrating 0 -  Moving slowly or fidgety/restless 0 -  Suicidal thoughts 0 -  PHQ-9 Score 0 -  Difficult doing work/chores Not difficult at all -   Interpretation of Total Score  Total Score Depression Severity:  1-4 = Minimal depression, 5-9 = Mild depression, 10-14 = Moderate depression, 15-19 = Moderately severe depression, 20-27 = Severe depression   Psychosocial Evaluation and  Intervention:  Psychosocial Evaluation - 01/25/23 1305       Psychosocial Evaluation & Interventions   Interventions Encouraged to exercise with the program and follow exercise prescription    Expected Outcomes For pt to participate in program    Continue Psychosocial Services  Follow up required by staff             Psychosocial Re-Evaluation:  Psychosocial Re-Evaluation     Row Name 01/27/23 1525             Psychosocial Re-Evaluation   Current issues with None Identified       Comments No changes since orientation. Andie is scheduled to start the program on 02/02/23.       Expected Outcomes For [place] to participate in PR free or barriers or concerns       Interventions Encouraged to attend Pulmonary Rehabilitation for the exercise       Continue Psychosocial Services  No Follow up required                Psychosocial Discharge (Final Psychosocial Re-Evaluation):  Psychosocial Re-Evaluation - 01/27/23 1525       Psychosocial Re-Evaluation   Current issues with None Identified    Comments No changes since orientation. Daylen is scheduled to start the program on 02/02/23.    Expected Outcomes For Ariston to participate in PR free or barriers or concerns    Interventions Encouraged to attend Pulmonary Rehabilitation for the exercise    Continue Psychosocial Services  No Follow up required             Education: Education Goals: Education classes will be provided on a weekly basis, covering required topics. Participant will state understanding/return demonstration of topics presented.  Learning Barriers/Preferences:  Learning Barriers/Preferences - 01/25/23 1306       Learning Barriers/Preferences   Learning Barriers None    Learning Preferences Audio  Education Topics: Know Your Numbers Group instruction that is supported by a PowerPoint presentation. Instructor discusses importance of knowing and understanding resting, exercise, and  post-exercise oxygen saturation, heart rate, and blood pressure. Oxygen saturation, heart rate, blood pressure, rating of perceived exertion, and dyspnea are reviewed along with a normal range for these values.    Exercise for the Pulmonary Patient Group instruction that is supported by a PowerPoint presentation. Instructor discusses benefits of exercise, core components of exercise, frequency, duration, and intensity of an exercise routine, importance of utilizing pulse oximetry during exercise, safety while exercising, and options of places to exercise outside of rehab.    MET Level  Group instruction provided by PowerPoint, verbal discussion, and written material to support subject matter. Instructor reviews what METs are and how to increase METs.    Pulmonary Medications Verbally interactive group education provided by instructor with focus on inhaled medications and proper administration.   Anatomy and Physiology of the Respiratory System Group instruction provided by PowerPoint, verbal discussion, and written material to support subject matter. Instructor reviews respiratory cycle and anatomical components of the respiratory system and their functions. Instructor also reviews differences in obstructive and restrictive respiratory diseases with examples of each.    Oxygen Safety Group instruction provided by PowerPoint, verbal discussion, and written material to support subject matter. There is an overview of "What is Oxygen" and "Why do we need it".  Instructor also reviews how to create a safe environment for oxygen use, the importance of using oxygen as prescribed, and the risks of noncompliance. There is a brief discussion on traveling with oxygen and resources the patient may utilize.   Oxygen Use Group instruction provided by PowerPoint, verbal discussion, and written material to discuss how supplemental oxygen is prescribed and different types of oxygen supply systems. Resources  for more information are provided.    Breathing Techniques Group instruction that is supported by demonstration and informational handouts. Instructor discusses the benefits of pursed lip and diaphragmatic breathing and detailed demonstration on how to perform both.     Risk Factor Reduction Group instruction that is supported by a PowerPoint presentation. Instructor discusses the definition of a risk factor, different risk factors for pulmonary disease, and how the heart and lungs work together.   Pulmonary Diseases Group instruction provided by PowerPoint, verbal discussion, and written material to support subject matter. Instructor gives an overview of the different type of pulmonary diseases. There is also a discussion on risk factors and symptoms as well as ways to manage the diseases.   Stress and Energy Conservation Group instruction provided by PowerPoint, verbal discussion, and written material to support subject matter. Instructor gives an overview of stress and the impact it can have on the body. Instructor also reviews ways to reduce stress. There is also a discussion on energy conservation and ways to conserve energy throughout the day.   Warning Signs and Symptoms Group instruction provided by PowerPoint, verbal discussion, and written material to support subject matter. Instructor reviews warning signs and symptoms of stroke, heart attack, cold and flu. Instructor also reviews ways to prevent the spread of infection.   Other Education Group or individual verbal, written, or video instructions that support the educational goals of the pulmonary rehab program.    Knowledge Questionnaire Score:  Knowledge Questionnaire Score - 01/25/23 1327       Knowledge Questionnaire Score   Pre Score 11/18             Core Components/Risk Factors/Patient  Goals at Admission:  Personal Goals and Risk Factors at Admission - 01/25/23 1307       Core Components/Risk  Factors/Patient Goals on Admission   Improve shortness of breath with ADL's Yes    Intervention Provide education, individualized exercise plan and daily activity instruction to help decrease symptoms of SOB with activities of daily living.    Expected Outcomes Short Term: Improve cardiorespiratory fitness to achieve a reduction of symptoms when performing ADLs;Long Term: Be able to perform more ADLs without symptoms or delay the onset of symptoms             Core Components/Risk Factors/Patient Goals Review:   Goals and Risk Factor Review     Row Name 01/30/23 0929             Core Components/Risk Factors/Patient Goals Review   Personal Goals Review Improve shortness of breath with ADL's;Develop more efficient breathing techniques such as purse lipped breathing and diaphragmatic breathing and practicing self-pacing with activity.       Review Unable to asses goals, Aveion is scheduled to start the program on 01/31/23       Expected Outcomes For Domnique to improve shortness of breath with ADL's and develop more efficient breathing techniques such as purse lipped breathing and diaphragmatic breathing; and practicing self-pacing with activity and improve shortness of breath with ADL's                Core Components/Risk Factors/Patient Goals at Discharge (Final Review):   Goals and Risk Factor Review - 01/30/23 0929       Core Components/Risk Factors/Patient Goals Review   Personal Goals Review Improve shortness of breath with ADL's;Develop more efficient breathing techniques such as purse lipped breathing and diaphragmatic breathing and practicing self-pacing with activity.    Review Unable to asses goals, Evaristo is scheduled to start the program on 01/31/23    Expected Outcomes For Corey to improve shortness of breath with ADL's and develop more efficient breathing techniques such as purse lipped breathing and diaphragmatic breathing; and practicing self-pacing with activity and  improve shortness of breath with ADL's             ITP Comments: Pt is making expected progress toward Pulmonary Rehab goals after completing 1 session(s). Recommend continued exercise, life style modification, education, and utilization of breathing techniques to increase stamina and strength, while also decreasing shortness of breath with exertion.  Dr. Mechele Collin is Medical Director for Pulmonary Rehab at [facility].

## 2023-02-02 ENCOUNTER — Encounter (HOSPITAL_COMMUNITY)
Admission: RE | Admit: 2023-02-02 | Discharge: 2023-02-02 | Disposition: A | Discharge status: Home / Self Care | Payer: HMO | Source: Ambulatory Visit | Attending: Pulmonary Disease | Admitting: Pulmonary Disease

## 2023-02-02 DIAGNOSIS — Z5112 Encounter for antineoplastic immunotherapy: Secondary | ICD-10-CM | POA: Diagnosis not present

## 2023-02-02 DIAGNOSIS — C3412 Malignant neoplasm of upper lobe, left bronchus or lung: Secondary | ICD-10-CM

## 2023-02-02 NOTE — Progress Notes (Signed)
Daily Session Note  Patient Details  Name: Raymond Velez MRN: [id number] Date of Birth: 04-May-1955 Referring Provider:   Doristine Devoid Pulmonary Rehab Walk Test from 01/25/2023 in [facility] for [facility], V[facility], & Lung Health  Referring Provider [name]       Encounter Date: 02/02/2023  Check In:  Session Check In - 02/02/23 6433       Check-In   Supervising physician immediately available to respond to emergencies [facility] MD immediately available    Physician(s) Robin Searing, NP    Location [facility]    Staff Present Raford Pitcher, MS, ACSM-CEP, Exercise Physiologist;Anni Hocevar Synthia Innocent, RN, BSN    Virtual Visit No    Medication changes reported     No    Fall or balance concerns reported    No    Tobacco Cessation No Change    Warm-up and Cool-down Performed as group-led instruction   orientation only   Resistance Training Performed Yes    VAD Patient? No    PAD/SET Patient? No      Pain Assessment   Currently in Pain? No/denies    Multiple Pain Sites No             Capillary Blood Glucose: Results for orders placed or performed in visit on 02/01/23 (from the past 24 hours)  CMP (Cancer Center only)     Status: Abnormal   Collection Time: 02/01/23  2:23 PM  Result Value Ref Range   Sodium 135 135 - 145 mmol/L   Potassium 4.0 3.5 - 5.1 mmol/L   Chloride 100 98 - 111 mmol/L   CO2 30 22 - 32 mmol/L   Glucose, Bld 146 (H) 70 - 99 mg/dL   BUN 13 8 - 23 mg/dL   Creatinine 2.95 1.88 - 1.24 mg/dL   Calcium 9.5 8.9 - 41.6 mg/dL   Total Protein 7.6 6.5 - 8.1 g/dL   Albumin 4.1 3.5 - 5.0 g/dL   AST 15 15 - 41 U/L   ALT 27 0 - 44 U/L   Alkaline Phosphatase 77 38 - 126 U/L   Total Bilirubin 0.7 0.0 - 1.2 mg/dL   GFR, Estimated >60 >63 mL/min   Anion gap 5 5 - 15  CBC with Differential (Cancer Center Only)     Status: Abnormal   Collection Time: 02/01/23  2:23 PM  Result Value Ref Range   WBC Count 13.1 (H)  4.0 - 10.5 K/uL   RBC 3.77 (L) 4.22 - 5.81 MIL/uL   Hemoglobin 11.2 (L) 13.0 - 17.0 g/dL   HCT 01.6 (L) 01.0 - 93.2 %   MCV 91.2 80.0 - 100.0 fL   MCH 29.7 26.0 - 34.0 pg   MCHC 32.6 30.0 - 36.0 g/dL   RDW 35.5 73.2 - 20.2 %   Platelet Count 140 (L) 150 - 400 K/uL   nRBC 0.0 0.0 - 0.2 %   Neutrophils Relative % 79 %   Neutro Abs 10.3 (H) 1.7 - 7.7 K/uL   Lymphocytes Relative 12 %   Lymphs Abs 1.6 0.7 - 4.0 K/uL   Monocytes Relative 7 %   Monocytes Absolute 1.0 0.1 - 1.0 K/uL   Eosinophils Relative 1 %   Eosinophils Absolute 0.1 0.0 - 0.5 K/uL   Basophils Relative 0 %   Basophils Absolute 0.1 0.0 - 0.1 K/uL   Immature Granulocytes 1 %   Abs Immature Granulocytes 0.12 (H) 0.00 - 0.07 K/uL  Social History   Tobacco Use  Smoking Status Former   Current packs/day: 0.00   Average packs/day: 1.5 packs/day for 15.1 years (22.7 ttl pk-yrs)   Types: Cigarettes   Start date: 11/22/2002   Quit date: 11/12/2022   Years since quitting: 0.2  Smokeless Tobacco Never    Goals Met:  Proper associated with RPD/PD & O2 Sat Independence with exercise equipment Exercise tolerated well No report of concerns or symptoms today Strength training completed today  Goals Unmet:  Not Applicable  Comments: Service time is from 0809 to 0925.    Dr. Mechele Collin is Medical Director for Pulmonary Rehab at [facility].

## 2023-02-03 ENCOUNTER — Telehealth (HOSPITAL_COMMUNITY): Payer: Self-pay

## 2023-02-03 NOTE — Telephone Encounter (Signed)
LVM on nurse triage line at Dr. Algis Downs office to get approval for a THR increase to 145 for pt. Awaiting call back

## 2023-02-06 ENCOUNTER — Inpatient Hospital Stay (HOSPITAL_BASED_OUTPATIENT_CLINIC_OR_DEPARTMENT_OTHER): Payer: HMO | Admitting: Internal Medicine

## 2023-02-06 ENCOUNTER — Telehealth: Payer: Self-pay

## 2023-02-06 VITALS — BP 136/78 | HR 82 | Temp 97.5°F | Resp 17 | Ht 68.0 in | Wt 203.5 lb

## 2023-02-06 DIAGNOSIS — C3412 Malignant neoplasm of upper lobe, left bronchus or lung: Secondary | ICD-10-CM

## 2023-02-06 DIAGNOSIS — Z5112 Encounter for antineoplastic immunotherapy: Secondary | ICD-10-CM | POA: Diagnosis not present

## 2023-02-06 NOTE — Telephone Encounter (Signed)
Patients daughter called in and stated that her dad started having symptoms Friday night of body aches, chills, sweating, and coughing up a little bit of blood (red) starting this morning (daughter stated this has happened before after the pegfilgastrim shot). Patients daughter denies fever. Informed daughter that our recommendation is for the patient to go to the ER, but she said he insisted on seeing Dr. Arbutus Ped.  Made patient appt for 0845 this morning.

## 2023-02-06 NOTE — Progress Notes (Signed)
[facility] [phone]   [phone]  OFFICE PROGRESS NOTE  Jackelyn Poling, DO [address] [place] [address]  DIAGNOSIS: Stage IIB (T3, N0, M0) non-small cell lung cancer, squamous cell carcinoma presented with left suprahilar mass with suspicious postobstructive pneumonitis/lymphangitic spread and satellite nodules diagnosed in November 2024.   PRIOR THERAPY: None  CURRENT THERAPY: Neoadjuvant chemoimmunotherapy with carboplatin for AUC of 6, paclitaxel 200 Mg/M2 and nivolumab 360 Mg IV every 3 weeks with Neulasta support for total of 4 cycles.  Status post 2 cycles.  INTERVAL HISTORY: Raymond Velez 67 y.o. male returns to the clinic today for follow-up visit. Discussed the use of AI scribe software for clinical note transcription with the patient, who gave verbal consent to proceed.  History of Present Illness   The patient, a 68 year old diagnosed with stage 2B non-small cell lung cancer (squamous cell carcinoma) in November 2024, is currently undergoing neoadjuvant chemotherapy and immunotherapy. The regimen includes carboplatin, paclitaxel, and nivolumab every three weeks. He has completed two cycles and is due for the third.  The patient reports generally tolerating the treatment well, with hair loss being the only significant side effect. However, he experienced a flu-like syndrome over the weekend prior to the consultation, characterized by muscle aches and chills, but no fever. The symptoms resolved with night sweats, and the patient felt better by the time of the consultation.  Additionally, the patient noticed a trace amount of blood in his sputum when coughing, which caused some concern. He denies any chest pain, shortness of breath, nausea, vomiting, diarrhea, or significant weight loss. The patient's appetite decreased slightly in the last few days but has since returned to normal.  The patient is also participating in a  rehabilitation program twice a week. Despite the challenges, the patient expresses a positive outlook and commitment to the treatment plan.       MEDICAL HISTORY: Past Medical History:  Diagnosis Date   Arthritis    Hyperlipidemia    RBBB     ALLERGIES:  has no known allergies.  MEDICATIONS:  Current Outpatient Medications  Medication Sig Dispense Refill   atorvastatin (LIPITOR) 20 MG tablet Take 20 mg by mouth daily.     No current facility-administered medications for this visit.    SURGICAL HISTORY:  Past Surgical History:  Procedure Laterality Date   BRONCHIAL BIOPSY  11/14/2022   Procedure: BRONCHIAL BIOPSIES;  Surgeon: Leslye Peer, MD;  Location: [facility] ENDOSCOPY;  Service: Pulmonary;;   BRONCHIAL BRUSHINGS  11/14/2022   Procedure: BRONCHIAL BRUSHINGS;  Surgeon: Leslye Peer, MD;  Location: [facility] ENDOSCOPY;  Service: Pulmonary;;   COLONOSCOPY      x 3   HEMOSTASIS CONTROL  11/14/2022   Procedure: HEMOSTASIS CONTROL;  Surgeon: Leslye Peer, MD;  Location: [facility] ENDOSCOPY;  Service: Pulmonary;;  epi and cold saline   TOTAL HIP ARTHROPLASTY Left 10/09/2018   Procedure: TOTAL HIP ARTHROPLASTY ANTERIOR APPROACH;  Surgeon: Durene Romans, MD;  Location: [facility] ORS;  Service: Orthopedics;  Laterality: Left;  70 mins   VIDEO BRONCHOSCOPY N/A 11/14/2022   Procedure: VIDEO BRONCHOSCOPY;  Surgeon: Leslye Peer, MD;  Location: [facility] ENDOSCOPY;  Service: Pulmonary;  Laterality: N/A;    REVIEW OF SYSTEMS:  A comprehensive review of systems was negative except for: Respiratory: positive for cough Integument/breast: positive for alopecia    PHYSICAL EXAMINATION: General appearance: alert, cooperative, and no distress Head: Normocephalic, without obvious abnormality, atraumatic Neck: no adenopathy,  no JVD, supple, symmetrical, trachea midline, and thyroid not enlarged, symmetric, no tenderness/mass/nodules Lymph nodes: Cervical, supraclavicular, and axillary nodes normal. Resp: clear to  auscultation bilaterally Back: symmetric, no curvature. ROM normal. No CVA tenderness. Cardio: regular rate and rhythm, S1, S2 normal, no murmur, click, rub or gallop GI: soft, non-tender; bowel sounds normal; no masses,  no organomegaly Extremities: extremities normal, atraumatic, no cyanosis or edema  ECOG PERFORMANCE STATUS: 1 - Symptomatic but completely ambulatory  Blood pressure 136/78, pulse 82, temperature (!) 97.5 F (36.4 C), temperature source Temporal, resp. rate 17, height 5\' 8"  (1.727 m), weight 203 lb 8 oz (92.3 kg), SpO2 99%.  LABORATORY DATA: Lab Results  Component Value Date   WBC 13.1 (H) 02/01/2023   HGB 11.2 (L) 02/01/2023   HCT 34.4 (L) 02/01/2023   MCV 91.2 02/01/2023   PLT 140 (L) 02/01/2023      Chemistry      Component Value Date/Time   NA 135 02/01/2023 1423   K 4.0 02/01/2023 1423   CL 100 02/01/2023 1423   CO2 30 02/01/2023 1423   BUN 13 02/01/2023 1423   CREATININE 0.89 02/01/2023 1423   CREATININE 0.69 (L) 09/24/2014 0828      Component Value Date/Time   CALCIUM 9.5 02/01/2023 1423   ALKPHOS 77 02/01/2023 1423   AST 15 02/01/2023 1423   ALT 27 02/01/2023 1423   BILITOT 0.7 02/01/2023 1423       RADIOGRAPHIC STUDIES: No results found.  ASSESSMENT AND PLAN: This is a very pleasant 68 years old white male with Stage IIB (T3, N0, M0) non-small cell lung cancer, squamous cell carcinoma presented with left suprahilar mass with suspicious postobstructive pneumonitis/lymphangitic spread and satellite nodules diagnosed in November 2024.  He is currently undergoing neoadjuvant chemoimmunotherapy with carboplatin for AUC of 6, paclitaxel 200 Mg/M2 and nivolumab 360 mg IV every 3 weeks with growth factor support status post 2 cycles. The patient is tolerating this treatment fairly well with no concerning adverse effects except for the alopecia and mild fatigue.    Stage IIB Non-Small Cell Lung Cancer (NSCLC) - Squamous Cell Carcinoma Diagnosed in  November 2024. Currently undergoing neoadjuvant chemotherapy and immunotherapy with carboplatin, paclitaxel, and nivolumab every three weeks. Completed two cycles. Reports hair loss, flu-like symptoms without fever, and trace hemoptysis. No signs of infection, chest pain, shortness of breath, nausea, vomiting, diarrhea, or significant weight loss. Blood counts are within normal limits. Discussed the importance of continuing treatment despite mild symptoms. Patient prefers to keep the treatment schedule unchanged. - Proceed with the third cycle of chemotherapy and immunotherapy as scheduled on Wednesday - Schedule a follow-up scan after the final cycle - Refer to Dr. Mikel Cella at [facility] for surgical evaluation post-chemotherapy - Continue twice-weekly rehabilitation sessions  General Health Maintenance All necessary vaccinations received prior to treatment. No new health maintenance issues discussed. - Monitor general health and vaccination status  Follow-up - Follow-up visit in three weeks for the final treatment cycle.   The patient was advised to call immediately if he has any other concerning symptoms in the interval. The patient voices understanding of current disease status and treatment options and is in agreement with the current care plan.  All questions were answered. The patient knows to call the clinic with any problems, questions or concerns. We can certainly see the patient much sooner if necessary.  The total time spent in the appointment was 20 minutes.  Disclaimer: This note was dictated with voice recognition software.  Similar sounding words can inadvertently be transcribed and may not be corrected upon review.

## 2023-02-07 ENCOUNTER — Encounter (HOSPITAL_COMMUNITY)
Admission: RE | Admit: 2023-02-07 | Discharge: 2023-02-07 | Disposition: A | Discharge status: Home / Self Care | Payer: HMO | Source: Ambulatory Visit | Attending: Pulmonary Disease

## 2023-02-07 DIAGNOSIS — Z5112 Encounter for antineoplastic immunotherapy: Secondary | ICD-10-CM | POA: Diagnosis not present

## 2023-02-07 DIAGNOSIS — C3412 Malignant neoplasm of upper lobe, left bronchus or lung: Secondary | ICD-10-CM

## 2023-02-07 MED FILL — Fosaprepitant Dimeglumine For IV Infusion 150 MG (Base Eq): INTRAVENOUS | Qty: 5 | Status: AC

## 2023-02-07 NOTE — Progress Notes (Signed)
Daily Session Note  Patient Details  Name: Raymond Velez MRN: [id number] Date of Birth: [birth year] Referring Provider:   Doristine Devoid Pulmonary Rehab Walk Test from 01/25/2023 in [facility] for Heart, Vascular, & Lung Health  Referring Provider [name]       Encounter Date: 02/07/2023  Check In:  Session Check In - 02/07/23 7829       Check-In   Supervising physician immediately available to respond to emergencies [facility] MD immediately available    Physician(s) Tereso Newcomer, NP    Location [facility]    Staff Present Raford Pitcher, MS, ACSM-CEP, Exercise Physiologist;Casey Synthia Innocent, RN, BSN    Virtual Visit No    Medication changes reported     No    Fall or balance concerns reported    No    Tobacco Cessation No Change    Warm-up and Cool-down Performed as group-led instruction   orientation only   Resistance Training Performed Yes    VAD Patient? No    PAD/SET Patient? No      Pain Assessment   Currently in Pain? No/denies    Multiple Pain Sites No             Capillary Blood Glucose: No results found for this or any previous visit (from the past 24 hours).    Social History   Tobacco Use  Smoking Status Former   Current packs/day: 0.00   Average packs/day: 1.5 packs/day for 15.1 years (22.7 ttl pk-yrs)   Types: Cigarettes   Start date: 11/22/2002   Quit date: 11/12/2022   Years since quitting: 0.2  Smokeless Tobacco Never    Goals Met:  Proper associated with RPD/PD & O2 Sat Exercise tolerated well No report of concerns or symptoms today Strength training completed today  Goals Unmet:  Not Applicable  Comments: Service time is from 0817 to [phone].    Dr. Mechele Collin is Medical Director for Pulmonary Rehab at [facility].

## 2023-02-08 ENCOUNTER — Inpatient Hospital Stay: Payer: HMO

## 2023-02-08 ENCOUNTER — Other Ambulatory Visit: Payer: HMO

## 2023-02-08 ENCOUNTER — Ambulatory Visit: Payer: HMO | Admitting: Internal Medicine

## 2023-02-08 VITALS — BP 129/79 | HR 79 | Temp 98.2°F | Resp 18 | Wt 203.4 lb

## 2023-02-08 DIAGNOSIS — Z5112 Encounter for antineoplastic immunotherapy: Secondary | ICD-10-CM | POA: Diagnosis not present

## 2023-02-08 DIAGNOSIS — C3412 Malignant neoplasm of upper lobe, left bronchus or lung: Secondary | ICD-10-CM

## 2023-02-08 LAB — CBC WITH DIFFERENTIAL (CANCER CENTER ONLY)
Abs Immature Granulocytes: 0.04 10*3/uL (ref 0.00–0.07)
Basophils Absolute: 0.1 10*3/uL (ref 0.0–0.1)
Basophils Relative: 1 %
Eosinophils Absolute: 0.1 10*3/uL (ref 0.0–0.5)
Eosinophils Relative: 1 %
HCT: 30.2 % — ABNORMAL LOW (ref 39.0–52.0)
Hemoglobin: 10 g/dL — ABNORMAL LOW (ref 13.0–17.0)
Immature Granulocytes: 0 %
Lymphocytes Relative: 16 %
Lymphs Abs: 1.5 10*3/uL (ref 0.7–4.0)
MCH: 29.9 pg (ref 26.0–34.0)
MCHC: 33.1 g/dL (ref 30.0–36.0)
MCV: 90.1 fL (ref 80.0–100.0)
Monocytes Absolute: 1 10*3/uL (ref 0.1–1.0)
Monocytes Relative: 10 %
Neutro Abs: 6.8 10*3/uL (ref 1.7–7.7)
Neutrophils Relative %: 72 %
Platelet Count: 273 10*3/uL (ref 150–400)
RBC: 3.35 MIL/uL — ABNORMAL LOW (ref 4.22–5.81)
RDW: 14.9 % (ref 11.5–15.5)
WBC Count: 9.4 10*3/uL (ref 4.0–10.5)
nRBC: 0 % (ref 0.0–0.2)

## 2023-02-08 LAB — CMP (CANCER CENTER ONLY)
ALT: 31 U/L (ref 0–44)
AST: 23 U/L (ref 15–41)
Albumin: 3.8 g/dL (ref 3.5–5.0)
Alkaline Phosphatase: 46 U/L (ref 38–126)
Anion gap: 9 (ref 5–15)
BUN: 13 mg/dL (ref 8–23)
CO2: 25 mmol/L (ref 22–32)
Calcium: 9.6 mg/dL (ref 8.9–10.3)
Chloride: 100 mmol/L (ref 98–111)
Creatinine: 0.79 mg/dL (ref 0.61–1.24)
GFR, Estimated: >60 mL/min (ref >60)
Glucose, Bld: 135 mg/dL — ABNORMAL HIGH (ref 70–99)
Potassium: 3.8 mmol/L (ref 3.5–5.1)
Sodium: 134 mmol/L — ABNORMAL LOW (ref 135–145)
Total Bilirubin: 0.8 mg/dL (ref 0.0–1.2)
Total Protein: 7.6 g/dL (ref 6.5–8.1)

## 2023-02-08 LAB — TSH: TSH: 0.734 u[IU]/mL (ref 0.350–4.500)

## 2023-02-08 MED ORDER — SODIUM CHLORIDE 0.9 % IV SOLN: INTRAVENOUS | Status: DC

## 2023-02-08 MED ORDER — PALONOSETRON HCL INJECTION 0.25 MG/5ML
0.2500 mg | Freq: Once | INTRAVENOUS | Status: AC
  Administered 2023-02-08: 0.25 mg via INTRAVENOUS
  Filled 2023-02-08: qty 5

## 2023-02-08 MED ORDER — FAMOTIDINE IN NACL 20-0.9 MG/50ML-% IV SOLN
20.0000 mg | Freq: Once | INTRAVENOUS | Status: AC
  Administered 2023-02-08: 20 mg via INTRAVENOUS
  Filled 2023-02-08: qty 50

## 2023-02-08 MED ORDER — SODIUM CHLORIDE 0.9% FLUSH
10.0000 mL | INTRAVENOUS | Status: DC | PRN
Start: 2023-02-08 — End: 2023-02-08

## 2023-02-08 MED ORDER — HEPARIN SOD (PORK) LOCK FLUSH 100 UNIT/ML IV SOLN: 500.0000 [IU] | Freq: Once | INTRAVENOUS | Status: DC | PRN

## 2023-02-08 MED ORDER — SODIUM CHLORIDE 0.9 % IV SOLN
150.0000 mg | Freq: Once | INTRAVENOUS | Status: AC
  Administered 2023-02-08: 150 mg via INTRAVENOUS
  Filled 2023-02-08: qty 150

## 2023-02-08 MED ORDER — DIPHENHYDRAMINE HCL 50 MG/ML IJ SOLN
50.0000 mg | Freq: Once | INTRAMUSCULAR | Status: AC
  Administered 2023-02-08: 50 mg via INTRAVENOUS
  Filled 2023-02-08: qty 1

## 2023-02-08 MED ORDER — SODIUM CHLORIDE 0.9 % IV SOLN
723.6000 mg | Freq: Once | INTRAVENOUS | Status: AC
  Administered 2023-02-08: 720 mg via INTRAVENOUS
  Filled 2023-02-08: qty 72

## 2023-02-08 MED ORDER — SODIUM CHLORIDE 0.9 % IV SOLN
200.0000 mg/m2 | Freq: Once | INTRAVENOUS | Status: AC
  Administered 2023-02-08: 426 mg via INTRAVENOUS
  Filled 2023-02-08: qty 71

## 2023-02-08 MED ORDER — SODIUM CHLORIDE 0.9 % IV SOLN
360.0000 mg | Freq: Once | INTRAVENOUS | Status: AC
  Administered 2023-02-08: 360 mg via INTRAVENOUS
  Filled 2023-02-08: qty 36

## 2023-02-08 MED ORDER — DEXAMETHASONE SODIUM PHOSPHATE 10 MG/ML IJ SOLN
10.0000 mg | Freq: Once | INTRAMUSCULAR | Status: AC
Start: 2023-02-08 — End: 2023-02-08
  Administered 2023-02-08: 10 mg via INTRAVENOUS
  Filled 2023-02-08: qty 1

## 2023-02-08 NOTE — Patient Instructions (Signed)
[facility] - A DEPT OF [facility]  Discharge Instructions: Thank you for choosing [facility] to provide your oncology and hematology care.   If you have a lab appointment with the Cancer Center, please go directly to the Cancer Center and check in at the registration area.   Wear comfortable clothing and clothing appropriate for easy access to any Portacath or PICC line.   We strive to give you quality time with your provider. You may need to reschedule your appointment if you arrive late (15 or more minutes).  Arriving late affects you and other patients whose appointments are after yours.  Also, if you miss three or more appointments without notifying the office, you may be dismissed from the clinic at the provider's discretion.      For prescription refill requests, have your pharmacy contact our office and allow 72 hours for refills to be completed.    Today you received the following chemotherapy and/or immunotherapy agents: Opdivo, Paclitaxel, Carboplatin.       To help prevent nausea and vomiting after your treatment, we encourage you to take your nausea medication as directed.  BELOW ARE SYMPTOMS THAT SHOULD BE REPORTED IMMEDIATELY: *FEVER GREATER THAN 100.4 F (38 C) OR HIGHER *CHILLS OR SWEATING *NAUSEA AND VOMITING THAT IS NOT CONTROLLED WITH YOUR NAUSEA MEDICATION *UNUSUAL SHORTNESS OF BREATH *UNUSUAL BRUISING OR BLEEDING *URINARY PROBLEMS (pain or burning when urinating, or frequent urination) *BOWEL PROBLEMS (unusual diarrhea, constipation, pain near the anus) TENDERNESS IN MOUTH AND THROAT WITH OR WITHOUT PRESENCE OF ULCERS (sore throat, sores in mouth, or a toothache) UNUSUAL RASH, SWELLING OR PAIN  UNUSUAL VAGINAL DISCHARGE OR ITCHING   Items with * indicate a potential emergency and should be followed up as soon as possible or go to the Emergency Department if any problems should occur.  Please show the CHEMOTHERAPY  ALERT CARD or IMMUNOTHERAPY ALERT CARD at check-in to the Emergency Department and triage nurse.  Should you have questions after your visit or need to cancel or reschedule your appointment, please contact [facility] - A DE[facility] OF [facility]  Dept: [phone]  and follow the prompts.  Office hours are 8:00 a.m. to 4:30 p.m. Monday - Friday. Please note that voicemails left after 4:00 p.m. may not be returned until the following business day.  We are closed weekends and major holidays. You have access to a nurse at all times for urgent questions. Please call the main number to the clinic Dept: [phone] and follow the prompts.   For any non-urgent questions, you may also contact your provider using MyChart. We now offer e-Visits for anyone 59 and older to request care online for non-urgent symptoms. For details visit [email or web address].   Also download the MyChart app! Go to the app store, search "MyChart", open the app, select [facility], and log in with your MyChart username and password.

## 2023-02-09 ENCOUNTER — Encounter (HOSPITAL_COMMUNITY): Admission: RE | Admit: 2023-02-09 | Payer: HMO | Source: Ambulatory Visit

## 2023-02-09 ENCOUNTER — Telehealth (HOSPITAL_COMMUNITY): Payer: Self-pay | Admitting: *Deleted

## 2023-02-09 LAB — T4: T4, Total: 8.7 ug/dL (ref 4.5–12.0)

## 2023-02-09 NOTE — Telephone Encounter (Signed)
Left voice mail message will be out today due to his car broke down this morning.

## 2023-02-10 ENCOUNTER — Inpatient Hospital Stay: Payer: HMO

## 2023-02-10 VITALS — BP 107/62 | HR 65 | Temp 97.8°F | Resp 18

## 2023-02-10 DIAGNOSIS — C3412 Malignant neoplasm of upper lobe, left bronchus or lung: Secondary | ICD-10-CM

## 2023-02-10 DIAGNOSIS — Z5112 Encounter for antineoplastic immunotherapy: Secondary | ICD-10-CM | POA: Diagnosis not present

## 2023-02-10 MED ORDER — PEGFILGRASTIM-FPGK 6 MG/0.6ML ~~LOC~~ SOSY
6.0000 mg | PREFILLED_SYRINGE | Freq: Once | SUBCUTANEOUS | Status: AC
  Administered 2023-02-10: 6 mg via SUBCUTANEOUS
  Filled 2023-02-10: qty 0.6

## 2023-02-14 ENCOUNTER — Encounter (HOSPITAL_COMMUNITY)
Admission: RE | Admit: 2023-02-14 | Discharge: 2023-02-14 | Disposition: A | Discharge status: Home / Self Care | Payer: HMO | Source: Ambulatory Visit | Attending: Pulmonary Disease | Admitting: Pulmonary Disease

## 2023-02-14 VITALS — Wt 200.6 lb

## 2023-02-14 DIAGNOSIS — C3412 Malignant neoplasm of upper lobe, left bronchus or lung: Secondary | ICD-10-CM | POA: Insufficient documentation

## 2023-02-14 NOTE — Progress Notes (Signed)
 Daily Session Note  Patient Details  Name: Raymond Velez MRN: [id number] Date of Birth: 1955-06-02 Referring Provider:   Conrad Ports Pulmonary Rehab Walk Test from 01/25/2023 in [facility] for Heart, Vascular, & Lung Health  Referring Provider Elmo red)       Encounter Date: 02/14/2023  Check In:  Session Check In - 02/14/23 9180       Check-In   Supervising physician immediately available to respond to emergencies [facility] MD immediately available    Physician(s) Josefa Beauvais, NP    Location [facility]    Staff Present Johnnie Moats, MS, ACSM-CEP, Exercise Physiologist;Addis Tuohy Claudene Idelia Aris BS, ACSM-CEP, Exercise Physiologist    Virtual Visit No    Medication changes reported     No    Fall or balance concerns reported    No    Tobacco Cessation No Change    Warm-up and Cool-down Performed as group-led instruction   orientation only   Resistance Training Performed Yes    VAD Patient? No    PAD/SET Patient? No      Pain Assessment   Currently in Pain? No/denies    Multiple Pain Sites No             Capillary Blood Glucose: No results found for this or any previous visit (from the past 24 hours).   Exercise Prescription Changes - 02/14/23 0900       Response to Exercise   Blood Pressure (Admit) 120/60    Blood Pressure (Exercise) 154/80    Blood Pressure (Exit) 114/68    Heart Rate (Admit) 71 bpm    Heart Rate (Exercise) 132 bpm    Heart Rate (Exit) 90 bpm    Oxygen Saturation (Admit) 98 %    Oxygen Saturation (Exercise) 95 %    Oxygen Saturation (Exit) 96 %    Rating of Perceived Exertion (Exercise) 13    Perceived Dyspnea (Exercise) 0    Duration Continue with 30 min of aerobic exercise without signs/symptoms of physical distress.    Intensity THRR unchanged      Progression   Progression Continue to progress workloads to maintain intensity without signs/symptoms of physical distress.       Resistance Training   Training Prescription Yes    Weight black bands    Reps 10-15    Time 10 Minutes      Treadmill   MPH 3    Grade 3    Minutes 15    METs 4.4      Bike   Level 4    Minutes 15    METs 4             Social History   Tobacco Use  Smoking Status Former   Current packs/day: 0.00   Average packs/day: 1.5 packs/day for 15.1 years (22.7 ttl pk-yrs)   Types: Cigarettes   Start date: 11/22/2002   Quit date: 11/12/2022   Years since quitting: 0.2  Smokeless Tobacco Never    Goals Met:  Proper associated with RPD/PD & O2 Sat Independence with exercise equipment Exercise tolerated well No report of concerns or symptoms today Strength training completed today  Goals Unmet:  Not Applicable  Comments: Service time is from 0807 to [phone].    Dr. Slater Staff is Medical Director for Pulmonary Rehab at [facility].

## 2023-02-15 ENCOUNTER — Inpatient Hospital Stay: Payer: HMO | Attending: Internal Medicine

## 2023-02-15 DIAGNOSIS — Z5112 Encounter for antineoplastic immunotherapy: Secondary | ICD-10-CM | POA: Diagnosis present

## 2023-02-15 DIAGNOSIS — C3412 Malignant neoplasm of upper lobe, left bronchus or lung: Secondary | ICD-10-CM | POA: Diagnosis present

## 2023-02-15 DIAGNOSIS — R0789 Other chest pain: Secondary | ICD-10-CM | POA: Insufficient documentation

## 2023-02-15 DIAGNOSIS — Z5189 Encounter for other specified aftercare: Secondary | ICD-10-CM | POA: Insufficient documentation

## 2023-02-15 DIAGNOSIS — R918 Other nonspecific abnormal finding of lung field: Secondary | ICD-10-CM

## 2023-02-15 DIAGNOSIS — Z79899 Other long term (current) drug therapy: Secondary | ICD-10-CM | POA: Diagnosis not present

## 2023-02-15 DIAGNOSIS — Z5111 Encounter for antineoplastic chemotherapy: Secondary | ICD-10-CM | POA: Insufficient documentation

## 2023-02-15 LAB — CMP (CANCER CENTER ONLY)
ALT: 79 U/L — ABNORMAL HIGH (ref 0–44)
AST: 28 U/L (ref 15–41)
Albumin: 4.2 g/dL (ref 3.5–5.0)
Alkaline Phosphatase: 97 U/L (ref 38–126)
Anion gap: 7 (ref 5–15)
BUN: 19 mg/dL (ref 8–23)
CO2: 28 mmol/L (ref 22–32)
Calcium: 9.6 mg/dL (ref 8.9–10.3)
Chloride: 99 mmol/L (ref 98–111)
Creatinine: 0.83 mg/dL (ref 0.61–1.24)
GFR, Estimated: >60 mL/min (ref >60)
Glucose, Bld: 94 mg/dL (ref 70–99)
Potassium: 4.2 mmol/L (ref 3.5–5.1)
Sodium: 134 mmol/L — ABNORMAL LOW (ref 135–145)
Total Bilirubin: 0.4 mg/dL (ref 0.0–1.2)
Total Protein: 7.8 g/dL (ref 6.5–8.1)

## 2023-02-15 LAB — CBC WITH DIFFERENTIAL (CANCER CENTER ONLY)
Abs Immature Granulocytes: 0.16 10*3/uL — ABNORMAL HIGH (ref 0.00–0.07)
Basophils Absolute: 0.2 10*3/uL — ABNORMAL HIGH (ref 0.0–0.1)
Basophils Relative: 1 %
Eosinophils Absolute: 0.3 10*3/uL (ref 0.0–0.5)
Eosinophils Relative: 1 %
HCT: 31.8 % — ABNORMAL LOW (ref 39.0–52.0)
Hemoglobin: 10.4 g/dL — ABNORMAL LOW (ref 13.0–17.0)
Immature Granulocytes: 1 %
Lymphocytes Relative: 17 %
Lymphs Abs: 3.2 10*3/uL (ref 0.7–4.0)
MCH: 30.1 pg (ref 26.0–34.0)
MCHC: 32.7 g/dL (ref 30.0–36.0)
MCV: 91.9 fL (ref 80.0–100.0)
Monocytes Absolute: 2 10*3/uL — ABNORMAL HIGH (ref 0.1–1.0)
Monocytes Relative: 11 %
Neutro Abs: 12.7 10*3/uL — ABNORMAL HIGH (ref 1.7–7.7)
Neutrophils Relative %: 69 %
Platelet Count: 265 10*3/uL (ref 150–400)
RBC: 3.46 MIL/uL — ABNORMAL LOW (ref 4.22–5.81)
RDW: 14.8 % (ref 11.5–15.5)
WBC Count: 18.4 10*3/uL — ABNORMAL HIGH (ref 4.0–10.5)
nRBC: 0 % (ref 0.0–0.2)

## 2023-02-16 ENCOUNTER — Encounter (HOSPITAL_COMMUNITY)
Admission: RE | Admit: 2023-02-16 | Discharge: 2023-02-16 | Disposition: A | Discharge status: Home / Self Care | Payer: HMO | Source: Ambulatory Visit | Attending: Pulmonary Disease | Admitting: Pulmonary Disease

## 2023-02-16 DIAGNOSIS — C3412 Malignant neoplasm of upper lobe, left bronchus or lung: Secondary | ICD-10-CM | POA: Diagnosis not present

## 2023-02-16 NOTE — Progress Notes (Signed)
 Daily Session Note  Patient Details  Name: Raymond Velez MRN: [id number] Date of Birth: 1955/01/29 Referring Provider:   Conrad Ports Pulmonary Rehab Walk Test from 01/25/2023 in [facility] for Heart, Vascular, & Lung Health  Referring Provider Elmo red)       Encounter Date: 02/16/2023  Check In:  Session Check In - 02/16/23 0818       Check-In   Supervising physician immediately available to respond to emergencies [facility] MD immediately available    Physician(s) Rosabel Mose, NP    Location [facility]    Staff Present Johnnie Moats, MS, ACSM-CEP, Exercise Physiologist;Casey Claudene Idelia Aris BS, ACSM-CEP, Exercise Physiologist;Mary Harvy, RN, BSN    Virtual Visit No    Medication changes reported     No    Fall or balance concerns reported    No    Tobacco Cessation No Change    Warm-up and Cool-down Performed as group-led instruction   orientation only   Resistance Training Performed Yes    VAD Patient? No    PAD/SET Patient? No      Pain Assessment   Currently in Pain? No/denies    Multiple Pain Sites No             Capillary Blood Glucose: Results for orders placed or performed in visit on 02/15/23 (from the past 24 hours)  CMP (Cancer Center only)     Status: Abnormal   Collection Time: 02/15/23  1:35 PM  Result Value Ref Range   Sodium 134 (L) 135 - 145 mmol/L   Potassium 4.2 3.5 - 5.1 mmol/L   Chloride 99 98 - 111 mmol/L   CO2 28 22 - 32 mmol/L   Glucose, Bld 94 70 - 99 mg/dL   BUN 19 8 - 23 mg/dL   Creatinine 9.16 9.38 - 1.24 mg/dL   Calcium  9.6 8.9 - 10.3 mg/dL   Total Protein 7.8 6.5 - 8.1 g/dL   Albumin 4.2 3.5 - 5.0 g/dL   AST 28 15 - 41 U/L   ALT 79 (H) 0 - 44 U/L   Alkaline Phosphatase 97 38 - 126 U/L   Total Bilirubin 0.4 0.0 - 1.2 mg/dL   GFR, Estimated >39 >39 mL/min   Anion gap 7 5 - 15  CBC with Differential (Cancer Center Only)     Status: Abnormal   Collection Time: 02/15/23  1:35  PM  Result Value Ref Range   WBC Count 18.4 (H) 4.0 - 10.5 K/uL   RBC 3.46 (L) 4.22 - 5.81 MIL/uL   Hemoglobin 10.4 (L) 13.0 - 17.0 g/dL   HCT 68.1 (L) 60.9 - 47.9 %   MCV 91.9 80.0 - 100.0 fL   MCH 30.1 26.0 - 34.0 pg   MCHC 32.7 30.0 - 36.0 g/dL   RDW 85.1 88.4 - 84.4 %   Platelet Count 265 150 - 400 K/uL   nRBC 0.0 0.0 - 0.2 %   Neutrophils Relative % 69 %   Neutro Abs 12.7 (H) 1.7 - 7.7 K/uL   Lymphocytes Relative 17 %   Lymphs Abs 3.2 0.7 - 4.0 K/uL   Monocytes Relative 11 %   Monocytes Absolute 2.0 (H) 0.1 - 1.0 K/uL   Eosinophils Relative 1 %   Eosinophils Absolute 0.3 0.0 - 0.5 K/uL   Basophils Relative 1 %   Basophils Absolute 0.2 (H) 0.0 - 0.1 K/uL   Immature Granulocytes 1 %   Abs Immature Granulocytes 0.16 (  H) 0.00 - 0.07 K/uL      Social History   Tobacco Use  Smoking Status Former   Current packs/day: 0.00   Average packs/day: 1.5 packs/day for 15.1 years (22.7 ttl pk-yrs)   Types: Cigarettes   Start date: 11/22/2002   Quit date: 11/12/2022   Years since quitting: 0.2  Smokeless Tobacco Never    Goals Met:  Proper associated with RPD/PD & O2 Sat Independence with exercise equipment Exercise tolerated well No report of concerns or symptoms today Strength training completed today  Goals Unmet:  Not Applicable  Comments: Service time is from 0808 to 0928.    Dr. Slater Staff is Medical Director for Pulmonary Rehab at [facility]

## 2023-02-20 ENCOUNTER — Encounter: Payer: Self-pay | Admitting: Medical Oncology

## 2023-02-20 ENCOUNTER — Encounter: Payer: Self-pay | Admitting: Internal Medicine

## 2023-02-21 ENCOUNTER — Encounter (HOSPITAL_COMMUNITY)
Admission: RE | Admit: 2023-02-21 | Discharge: 2023-02-21 | Disposition: A | Discharge status: Home / Self Care | Payer: HMO | Source: Ambulatory Visit | Attending: Pulmonary Disease | Admitting: Pulmonary Disease

## 2023-02-21 DIAGNOSIS — C3412 Malignant neoplasm of upper lobe, left bronchus or lung: Secondary | ICD-10-CM | POA: Diagnosis not present

## 2023-02-21 NOTE — Progress Notes (Signed)
Daily Session Note  Patient Details  Name: Raymond Velez MRN: [id number] Date of Birth: 12-08-1955 Referring Provider:   Doristine Devoid Pulmonary Rehab Walk Test from 01/25/2023 in [facility] for Heart, Vascular, & Lung Health  Referring Provider [name]       Encounter Date: 02/21/2023  Check In:  Session Check In - 02/21/23 0810       Check-In   Supervising physician immediately available to respond to emergencies [facility] immediately available    Physician(s) Hoover Browns, NP    Location [facility]    Staff Present Raford Pitcher, MS, ACSM-CEP, Exercise Physiologist;Mikyah Alamo Erin Sons BS, ACSM-CEP, Exercise Physiologist    Virtual Visit No    Medication changes reported     No    Fall or balance concerns reported    No    Tobacco Cessation No Change    Warm-up and Cool-down Performed as group-led instruction   orientation only   Resistance Training Performed Yes    VAD Patient? No    PAD/SET Patient? No      Pain Assessment   Currently in Pain? No/denies    Multiple Pain Sites No             Capillary Blood Glucose: No results found for this or any previous visit (from the past 24 hours).    Social History   Tobacco Use  Smoking Status Former   Current packs/day: 0.00   Average packs/day: 1.5 packs/day for 15.1 years (22.7 ttl pk-yrs)   Types: Cigarettes   Start date: 11/22/2002   Quit date: 11/12/2022   Years since quitting: 0.2  Smokeless Tobacco Never    Goals Met:  Proper associated with RPD/PD & O2 Sat Independence with exercise equipment Exercise tolerated well No report of concerns or symptoms today Strength training completed today  Goals Unmet:  Not Applicable  Comments: Service time is from 0805 to 0914.    Dr. Mechele Collin is Medical Director for Pulmonary Rehab at [facility].

## 2023-02-21 NOTE — Progress Notes (Signed)
Home Exercise Prescription I have reviewed a Home Exercise Prescription with Raymond Velez. Raymond Velez is currently exercising at home. He cycles 1-2 days/wk for 30 min/day. Raymond Velez also mentioned how he does work around [employer]. I explained to Raymond Velez the difference between activities of daily living and exercise. Worthy voiced understanding. I encouraged Raymond Velez to cycle or walk 2 non-rehab days/wk for 30 min/day. He agreed with my recommendations. Raymond Velez seems to understand the importance of exercising outside of rehab. The patient stated that their goals were to maintain current health. We reviewed exercise guidelines, target heart rate during exercise, RPE Scale, weather conditions, endpoints for exercise, warmup and cool down. The patient is encouraged to come to me with any questions. I will continue to follow up with the patient to assist them with progression and safety. Spent 15 min with patient discussing home exercise plan and goals  Joya San, MS, ACSM-CEP 02/21/2023 9:30 AM

## 2023-02-22 ENCOUNTER — Inpatient Hospital Stay: Payer: HMO

## 2023-02-22 DIAGNOSIS — Z5112 Encounter for antineoplastic immunotherapy: Secondary | ICD-10-CM | POA: Diagnosis not present

## 2023-02-22 DIAGNOSIS — C3412 Malignant neoplasm of upper lobe, left bronchus or lung: Secondary | ICD-10-CM

## 2023-02-22 LAB — CBC WITH DIFFERENTIAL (CANCER CENTER ONLY)
Abs Immature Granulocytes: 0.12 10*3/uL — ABNORMAL HIGH (ref 0.00–0.07)
Basophils Absolute: 0.1 10*3/uL (ref 0.0–0.1)
Basophils Relative: 1 %
Eosinophils Absolute: 0.1 10*3/uL (ref 0.0–0.5)
Eosinophils Relative: 1 %
HCT: 32.7 % — ABNORMAL LOW (ref 39.0–52.0)
Hemoglobin: 10.7 g/dL — ABNORMAL LOW (ref 13.0–17.0)
Immature Granulocytes: 1 %
Lymphocytes Relative: 14 %
Lymphs Abs: 2.1 10*3/uL (ref 0.7–4.0)
MCH: 30.3 pg (ref 26.0–34.0)
MCHC: 32.7 g/dL (ref 30.0–36.0)
MCV: 92.6 fL (ref 80.0–100.0)
Monocytes Absolute: 1 10*3/uL (ref 0.1–1.0)
Monocytes Relative: 7 %
Neutro Abs: 11.7 10*3/uL — ABNORMAL HIGH (ref 1.7–7.7)
Neutrophils Relative %: 76 %
Platelet Count: 222 10*3/uL (ref 150–400)
RBC: 3.53 MIL/uL — ABNORMAL LOW (ref 4.22–5.81)
RDW: 15.7 % — ABNORMAL HIGH (ref 11.5–15.5)
WBC Count: 15.1 10*3/uL — ABNORMAL HIGH (ref 4.0–10.5)
nRBC: 0 % (ref 0.0–0.2)

## 2023-02-22 LAB — CMP (CANCER CENTER ONLY)
ALT: 44 U/L (ref 0–44)
AST: 24 U/L (ref 15–41)
Albumin: 4.2 g/dL (ref 3.5–5.0)
Alkaline Phosphatase: 83 U/L (ref 38–126)
Anion gap: 8 (ref 5–15)
BUN: 18 mg/dL (ref 8–23)
CO2: 28 mmol/L (ref 22–32)
Calcium: 9.7 mg/dL (ref 8.9–10.3)
Chloride: 99 mmol/L (ref 98–111)
Creatinine: 0.84 mg/dL (ref 0.61–1.24)
GFR, Estimated: >60 mL/min (ref >60)
Glucose, Bld: 91 mg/dL (ref 70–99)
Potassium: 4.2 mmol/L (ref 3.5–5.1)
Sodium: 135 mmol/L (ref 135–145)
Total Bilirubin: 0.5 mg/dL (ref 0.0–1.2)
Total Protein: 7.8 g/dL (ref 6.5–8.1)

## 2023-02-23 ENCOUNTER — Encounter (HOSPITAL_COMMUNITY)
Admission: RE | Admit: 2023-02-23 | Discharge: 2023-02-23 | Disposition: A | Discharge status: Home / Self Care | Payer: HMO | Source: Ambulatory Visit | Attending: Pulmonary Disease | Admitting: Pulmonary Disease

## 2023-02-23 DIAGNOSIS — C3412 Malignant neoplasm of upper lobe, left bronchus or lung: Secondary | ICD-10-CM

## 2023-02-23 NOTE — Progress Notes (Signed)
 Daily Session Note  Patient Details  Name: Raymond Velez MRN: [id number] Date of Birth: 04/04/1955 Referring Provider:   Doristine Devoid Pulmonary Rehab Walk Test from 01/25/2023 in [facility] for Heart, Vascular, & Lung Health  Referring Provider [name]       Encounter Date: 02/23/2023  Check In:  Session Check In - 02/23/23 4098       Check-In   Supervising physician immediately available to respond to emergencies [facility] immediately available    Physician(s) Rise Paganini, NP    [facility]    Staff Present Raford Pitcher, MS, ACSM-CEP, Exercise Physiologist;Janie Strothman Synthia Innocent, RN, BSN    Virtual Visit No    Medication changes reported     No    Fall or balance concerns reported    No    Tobacco Cessation No Change    Warm-up and Cool-down Performed as group-led instruction   orientation only   Resistance Training Performed Yes    VAD Patient? No    PAD/SET Patient? No      Pain Assessment   Currently in Pain? No/denies    Pain Score 0-No pain    Multiple Pain Sites No             Capillary Blood Glucose: Results for orders placed or performed in visit on 02/22/23 (from the past 24 hours)  CMP (Cancer Center only)     Status: None   Collection Time: 02/22/23  1:50 PM  Result Value Ref Range   Sodium 135 135 - 145 mmol/L   Potassium 4.2 3.5 - 5.1 mmol/L   Chloride 99 98 - 111 mmol/L   CO2 28 22 - 32 mmol/L   Glucose, Bld 91 70 - 99 mg/dL   BUN 18 8 - 23 mg/dL   Creatinine 1.19 1.47 - 1.24 mg/dL   Calcium 9.7 8.9 - 82.9 mg/dL   Total Protein 7.8 6.5 - 8.1 g/dL   Albumin 4.2 3.5 - 5.0 g/dL   AST 24 15 - 41 U/L   ALT 44 0 - 44 U/L   Alkaline Phosphatase 83 38 - 126 U/L   Total Bilirubin 0.5 0.0 - 1.2 mg/dL   GFR, Estimated >56 >21 mL/min   Anion gap 8 5 - 15  CBC with Differential (Cancer Center Only)     Status: Abnormal   Collection Time: 02/22/23  1:50 PM  Result Value Ref Range    WBC Count 15.1 (H) 4.0 - 10.5 K/uL   RBC 3.53 (L) 4.22 - 5.81 MIL/uL   Hemoglobin 10.7 (L) 13.0 - 17.0 g/dL   HCT 30.8 (L) 65.7 - 84.6 %   MCV 92.6 80.0 - 100.0 fL   MCH 30.3 26.0 - 34.0 pg   MCHC 32.7 30.0 - 36.0 g/dL   RDW 96.2 (H) 95.2 - 84.1 %   Platelet Count 222 150 - 400 K/uL   nRBC 0.0 0.0 - 0.2 %   Neutrophils Relative % 76 %   Neutro Abs 11.7 (H) 1.7 - 7.7 K/uL   Lymphocytes Relative 14 %   Lymphs Abs 2.1 0.7 - 4.0 K/uL   Monocytes Relative 7 %   Monocytes Absolute 1.0 0.1 - 1.0 K/uL   Eosinophils Relative 1 %   Eosinophils Absolute 0.1 0.0 - 0.5 K/uL   Basophils Relative 1 %   Basophils Absolute 0.1 0.0 - 0.1 K/uL   Immature Granulocytes 1 %   Abs Immature Granulocytes 0.12 (H)  0.00 - 0.07 K/uL      Social History   Tobacco Use  Smoking Status Former   Current packs/day: 0.00   Average packs/day: 1.5 packs/day for 15.1 years (22.7 ttl pk-yrs)   Types: Cigarettes   Start date: 11/22/2002   Quit date: 11/12/2022   Years since quitting: 0.2  Smokeless Tobacco Never    Goals Met:  Proper associated with RPD/PD & O2 Sat Independence with exercise equipment Exercise tolerated well No report of concerns or symptoms today Strength training completed today  Goals Unmet:  Not Applicable  Comments: Service time is from 0814 to 0931.    Dr. Mechele Collin is Medical Director for Pulmonary Rehab at [facility].

## 2023-02-28 ENCOUNTER — Telehealth (HOSPITAL_COMMUNITY): Payer: Self-pay

## 2023-02-28 ENCOUNTER — Encounter (HOSPITAL_COMMUNITY)
Admission: RE | Admit: 2023-02-28 | Discharge: 2023-02-28 | Disposition: A | Discharge status: Home / Self Care | Payer: HMO | Source: Ambulatory Visit | Attending: Pulmonary Disease

## 2023-02-28 VITALS — Wt 202.8 lb

## 2023-02-28 DIAGNOSIS — C3412 Malignant neoplasm of upper lobe, left bronchus or lung: Secondary | ICD-10-CM | POA: Diagnosis not present

## 2023-02-28 MED FILL — Fosaprepitant Dimeglumine For IV Infusion 150 MG (Base Eq): INTRAVENOUS | Qty: 5 | Status: AC

## 2023-02-28 NOTE — Telephone Encounter (Signed)
 Called pt to let him know that we are cancelling Pulm Rehab Class on Thursday d/t weather.

## 2023-02-28 NOTE — Progress Notes (Signed)
 Daily Session Note  Patient Details  Name: Raymond Velez MRN: [id number] Date of Birth: [birth year] Referring Provider:   Doristine Devoid Pulmonary Rehab Walk Test from 01/25/2023 in [facility] for Heart, Vascular, & Lung Health  Referring Provider [name]       Encounter Date: 02/28/2023  Check In:  Session Check In - 02/28/23 3244       Check-In   Supervising physician immediately available to respond to emergencies [facility] MD immediately available    Physician(s) Carlyon Shadow, NP    [facility]    Staff Present Raford Pitcher, MS, ACSM-CEP, Exercise Physiologist;Casey Erin Sons BS, ACSM-CEP, Exercise Physiologist    Virtual Visit No    Medication changes reported     No    Fall or balance concerns reported    No    Tobacco Cessation No Change    Warm-up and Cool-down Performed as group-led instruction   orientation only   Resistance Training Performed Yes    VAD Patient? No    PAD/SET Patient? No      Pain Assessment   Currently in Pain? No/denies    Multiple Pain Sites No             Capillary Blood Glucose: No results found for this or any previous visit (from the past 24 hours).   Exercise Prescription Changes - 02/28/23 0900       Response to Exercise   Blood Pressure (Admit) 100/64    Blood Pressure (Exercise) 178/70    Blood Pressure (Exit) 102/70    Heart Rate (Admit) 70 bpm    Heart Rate (Exercise) 126 bpm    Heart Rate (Exit) 86 bpm    Oxygen Saturation (Admit) 98 %    Oxygen Saturation (Exercise) 96 %    Oxygen Saturation (Exit) 97 %    Rating of Perceived Exertion (Exercise) 13    Perceived Dyspnea (Exercise) 0    Duration Continue with 30 min of aerobic exercise without signs/symptoms of physical distress.    Intensity THRR unchanged      Progression   Progression Continue to progress workloads to maintain intensity without signs/symptoms of physical distress.       Resistance Training   Training Prescription Yes    Weight black bands    Reps 10-15    Time 10 Minutes      Treadmill   MPH 3.3    Grade 3    Minutes 15    METs 4.9      Bike   Level 1.1    Minutes 15    METs 4.3             Social History   Tobacco Use  Smoking Status Former   Current packs/day: 0.00   Average packs/day: 1.5 packs/day for 15.1 years (22.7 ttl pk-yrs)   Types: Cigarettes   Start date: 11/22/2002   Quit date: 11/12/2022   Years since quitting: 0.2  Smokeless Tobacco Never    Goals Met:  Proper associated with RPD/PD & O2 Sat Independence with exercise equipment Exercise tolerated well No report of concerns or symptoms today Strength training completed today  Goals Unmet:  Not Applicable  Comments: Service time is from 0804 to 0911.    Dr. Mechele Collin is Medical Director for Pulmonary Rehab at [facility].

## 2023-03-01 ENCOUNTER — Inpatient Hospital Stay (HOSPITAL_BASED_OUTPATIENT_CLINIC_OR_DEPARTMENT_OTHER): Payer: HMO | Admitting: Internal Medicine

## 2023-03-01 ENCOUNTER — Inpatient Hospital Stay: Payer: HMO

## 2023-03-01 ENCOUNTER — Inpatient Hospital Stay: Payer: HMO | Admitting: *Deleted

## 2023-03-01 VITALS — BP 112/64 | HR 80 | Temp 97.7°F | Resp 16 | Ht 68.0 in | Wt 200.8 lb

## 2023-03-01 VITALS — BP 111/64 | HR 66 | Resp 18

## 2023-03-01 DIAGNOSIS — C3412 Malignant neoplasm of upper lobe, left bronchus or lung: Secondary | ICD-10-CM | POA: Diagnosis not present

## 2023-03-01 DIAGNOSIS — Z5112 Encounter for antineoplastic immunotherapy: Secondary | ICD-10-CM | POA: Diagnosis not present

## 2023-03-01 LAB — CBC WITH DIFFERENTIAL (CANCER CENTER ONLY)
Abs Immature Granulocytes: 0.01 10*3/uL (ref 0.00–0.07)
Basophils Absolute: 0 10*3/uL (ref 0.0–0.1)
Basophils Relative: 1 %
Eosinophils Absolute: 0.1 10*3/uL (ref 0.0–0.5)
Eosinophils Relative: 1 %
HCT: 32.4 % — ABNORMAL LOW (ref 39.0–52.0)
Hemoglobin: 10.6 g/dL — ABNORMAL LOW (ref 13.0–17.0)
Immature Granulocytes: 0 %
Lymphocytes Relative: 30 %
Lymphs Abs: 1.9 10*3/uL (ref 0.7–4.0)
MCH: 30.5 pg (ref 26.0–34.0)
MCHC: 32.7 g/dL (ref 30.0–36.0)
MCV: 93.1 fL (ref 80.0–100.0)
Monocytes Absolute: 0.9 10*3/uL (ref 0.1–1.0)
Monocytes Relative: 14 %
Neutro Abs: 3.5 10*3/uL (ref 1.7–7.7)
Neutrophils Relative %: 54 %
Platelet Count: 242 10*3/uL (ref 150–400)
RBC: 3.48 MIL/uL — ABNORMAL LOW (ref 4.22–5.81)
RDW: 15.2 % (ref 11.5–15.5)
WBC Count: 6.4 10*3/uL (ref 4.0–10.5)
nRBC: 0 % (ref 0.0–0.2)

## 2023-03-01 LAB — CMP (CANCER CENTER ONLY)
ALT: 30 U/L (ref 0–44)
AST: 20 U/L (ref 15–41)
Albumin: 4.1 g/dL (ref 3.5–5.0)
Alkaline Phosphatase: 63 U/L (ref 38–126)
Anion gap: 7 (ref 5–15)
BUN: 13 mg/dL (ref 8–23)
CO2: 30 mmol/L (ref 22–32)
Calcium: 9.7 mg/dL (ref 8.9–10.3)
Chloride: 102 mmol/L (ref 98–111)
Creatinine: 0.82 mg/dL (ref 0.61–1.24)
GFR, Estimated: >60 mL/min (ref >60)
Glucose, Bld: 89 mg/dL (ref 70–99)
Potassium: 4.1 mmol/L (ref 3.5–5.1)
Sodium: 139 mmol/L (ref 135–145)
Total Bilirubin: 0.6 mg/dL (ref 0.0–1.2)
Total Protein: 7.5 g/dL (ref 6.5–8.1)

## 2023-03-01 LAB — TSH: TSH: 1.027 u[IU]/mL (ref 0.350–4.500)

## 2023-03-01 MED ORDER — SODIUM CHLORIDE 0.9 % IV SOLN: INTRAVENOUS | Status: AC

## 2023-03-01 MED ORDER — SODIUM CHLORIDE 0.9 % IV SOLN
150.0000 mg | Freq: Once | INTRAVENOUS | Status: AC
  Administered 2023-03-01: 150 mg via INTRAVENOUS
  Filled 2023-03-01: qty 150

## 2023-03-01 MED ORDER — FAMOTIDINE IN NACL 20-0.9 MG/50ML-% IV SOLN
20.0000 mg | Freq: Once | INTRAVENOUS | Status: AC
  Administered 2023-03-01: 20 mg via INTRAVENOUS
  Filled 2023-03-01: qty 50

## 2023-03-01 MED ORDER — CARBOPLATIN CHEMO INJECTION 600 MG/60ML
723.6000 mg | Freq: Once | INTRAVENOUS | Status: AC
  Administered 2023-03-01: 720 mg via INTRAVENOUS
  Filled 2023-03-01: qty 72

## 2023-03-01 MED ORDER — DEXAMETHASONE SODIUM PHOSPHATE 10 MG/ML IJ SOLN
10.0000 mg | Freq: Once | INTRAMUSCULAR | Status: AC
  Administered 2023-03-01: 10 mg via INTRAVENOUS
  Filled 2023-03-01: qty 1

## 2023-03-01 MED ORDER — SODIUM CHLORIDE 0.9 % IV SOLN
360.0000 mg | Freq: Once | INTRAVENOUS | Status: AC
  Administered 2023-03-01: 360 mg via INTRAVENOUS
  Filled 2023-03-01: qty 12

## 2023-03-01 MED ORDER — PALONOSETRON HCL INJECTION 0.25 MG/5ML
0.2500 mg | Freq: Once | INTRAVENOUS | Status: AC
Start: 2023-03-01 — End: 2023-03-01
  Administered 2023-03-01: 0.25 mg via INTRAVENOUS
  Filled 2023-03-01: qty 5

## 2023-03-01 MED ORDER — PACLITAXEL CHEMO INJECTION 300 MG/50ML
200.0000 mg/m2 | Freq: Once | INTRAVENOUS | Status: AC
  Administered 2023-03-01: 426 mg via INTRAVENOUS
  Filled 2023-03-01: qty 71

## 2023-03-01 MED ORDER — DIPHENHYDRAMINE HCL 50 MG/ML IJ SOLN
50.0000 mg | Freq: Once | INTRAMUSCULAR | Status: AC
  Administered 2023-03-01: 50 mg via INTRAVENOUS
  Filled 2023-03-01: qty 1

## 2023-03-01 NOTE — Progress Notes (Signed)
 [facility] [phone]   [phone]  OFFICE PROGRESS NOTE  Raymond Poling, DO [address] [place] [address]  DIAGNOSIS: Stage IIB (T3, N0, M0) non-small cell lung cancer, squamous cell carcinoma presented with left suprahilar mass with suspicious postobstructive pneumonitis/lymphangitic spread and satellite nodules diagnosed in November 2024.   PRIOR THERAPY: None  CURRENT THERAPY: Neoadjuvant chemoimmunotherapy with carboplatin for AUC of 6, paclitaxel 200 Mg/M2 and nivolumab 360 Mg IV every 3 weeks with Neulasta support for total of 4 cycles.  Status post 3 cycles.  INTERVAL HISTORY: Raymond Velez 68 y.o. male returns to the clinic today for follow-up visit accompanied by his son Raymond Velez. Discussed the use of AI scribe software for clinical note transcription with the patient, who gave verbal consent to proceed.  History of Present Illness   Raymond Velez is a 68 year old male with stage 2B non-small cell lung cancer who presents for follow-up after neoadjuvant chemotherapy and immunotherapy.  He is undergoing treatment for stage 2B non-small cell lung cancer, diagnosed in November 2024. He is currently receiving neoadjuvant chemotherapy and immunotherapy with carboplatin, paclitaxel, and nivolumab, administered every three weeks. He has completed three cycles and is here for the fourth and final cycle before planned surgery.  He experiences a sensation of heaviness on the left side, particularly noticeable when lying down on that side. This sensation does not radiate to his neck or arm and is not associated with pain, nausea, vomiting, or sweating. No history of heart attacks and no symptoms during physical exertion, such as when using a walking machine or bicycle.  He has experienced an improvement in oxygen saturation since ceasing smoking in November, with current levels at 100%.       MEDICAL HISTORY: Past Medical History:   Diagnosis Date   Arthritis    Hyperlipidemia    RBBB     ALLERGIES:  has no known allergies.  MEDICATIONS:  Current Outpatient Medications  Medication Sig Dispense Refill   atorvastatin (LIPITOR) 20 MG tablet Take 20 mg by mouth daily.     No current facility-administered medications for this visit.    SURGICAL HISTORY:  Past Surgical History:  Procedure Laterality Date   BRONCHIAL BIOPSY  11/14/2022   Procedure: BRONCHIAL BIOPSIES;  Surgeon: Leslye Peer, MD;  Location: [facility] ENDOSCOPY;  Service: Pulmonary;;   BRONCHIAL BRUSHINGS  11/14/2022   Procedure: BRONCHIAL BRUSHINGS;  Surgeon: Leslye Peer, MD;  Location: [facility] ENDOSCOPY;  Service: Pulmonary;;   COLONOSCOPY      x 3   HEMOSTASIS CONTROL  11/14/2022   Procedure: HEMOSTASIS CONTROL;  Surgeon: Leslye Peer, MD;  Location: [facility] ENDOSCOPY;  Service: Pulmonary;;  epi and cold saline   TOTAL HIP ARTHROPLASTY Left 10/09/2018   Procedure: TOTAL HIP ARTHROPLASTY ANTERIOR APPROACH;  Surgeon: Durene Romans, MD;  Location: [facility] ORS;  Service: Orthopedics;  Laterality: Left;  70 mins   VIDEO BRONCHOSCOPY N/A 11/14/2022   Procedure: VIDEO BRONCHOSCOPY;  Surgeon: Leslye Peer, MD;  Location: [facility] ENDOSCOPY;  Service: Pulmonary;  Laterality: N/A;    REVIEW OF SYSTEMS:  Constitutional: positive for fatigue Eyes: negative Ears, nose, mouth, throat, and face: negative Respiratory: positive for pleurisy/chest pain Cardiovascular: negative Gastrointestinal: negative Genitourinary:negative Integument/breast: negative Hematologic/lymphatic: negative Musculoskeletal:negative Neurological: negative Behavioral/Psych: negative Endocrine: negative Allergic/Immunologic: negative   PHYSICAL EXAMINATION: General appearance: alert, cooperative, and no distress Head: Normocephalic, without obvious abnormality, atraumatic Neck: no adenopathy, no JVD, supple, symmetrical, trachea midline,  and thyroid not enlarged, symmetric, no  tenderness/mass/nodules Lymph nodes: Cervical, supraclavicular, and axillary nodes normal. Resp: clear to auscultation bilaterally Back: symmetric, no curvature. ROM normal. No CVA tenderness. Cardio: regular rate and rhythm, S1, S2 normal, no murmur, click, rub or gallop GI: soft, non-tender; bowel sounds normal; no masses,  no organomegaly Extremities: extremities normal, atraumatic, no cyanosis or edema Neurologic: Alert and oriented X 3, normal strength and tone. Normal symmetric reflexes. Normal coordination and gait  ECOG PERFORMANCE STATUS: 1 - Symptomatic but completely ambulatory  Blood pressure 112/64, pulse 80, temperature 97.7 F (36.5 C), temperature source Temporal, resp. rate 16, height 5\' 8"  (1.727 m), weight 200 lb 12.8 oz (91.1 kg), SpO2 100%.  LABORATORY DATA: Lab Results  Component Value Date   WBC 6.4 03/01/2023   HGB 10.6 (L) 03/01/2023   HCT 32.4 (L) 03/01/2023   MCV 93.1 03/01/2023   PLT 242 03/01/2023      Chemistry      Component Value Date/Time   NA 139 03/01/2023 0945   K 4.1 03/01/2023 0945   CL 102 03/01/2023 0945   CO2 30 03/01/2023 0945   BUN 13 03/01/2023 0945   CREATININE 0.82 03/01/2023 0945   CREATININE 0.69 (L) 09/24/2014 0828      Component Value Date/Time   CALCIUM 9.7 03/01/2023 0945   ALKPHOS 63 03/01/2023 0945   AST 20 03/01/2023 0945   ALT 30 03/01/2023 0945   BILITOT 0.6 03/01/2023 0945       RADIOGRAPHIC STUDIES: No results found.  ASSESSMENT AND PLAN: This is a very pleasant 68 years old white male with Stage IIB (T3, N0, M0) non-small cell lung cancer, squamous cell carcinoma presented with left suprahilar mass with suspicious postobstructive pneumonitis/lymphangitic spread and satellite nodules diagnosed in November 2024.  He is currently undergoing neoadjuvant chemoimmunotherapy with carboplatin for AUC of 6, paclitaxel 200 Mg/M2 and nivolumab 360 mg IV every 3 weeks with growth factor support status post 3 cycles. He  continues to tolerate this treatment fairly well.     Stage IIB Non-Small Cell Lung Cancer (NSCLC) Stage IIB NSCLC diagnosed in November 2024. Undergoing neoadjuvant chemotherapy and immunotherapy with carboplatin, paclitaxel, and nivolumab every three weeks. Completed three cycles; receiving the fourth and final cycle today. Reports left-sided heaviness when lying down, without pain, nausea, vomiting, or sweating. Oxygen saturation is 100% since smoking cessation in November. Treatment aims to shrink the tumor before surgery on April 19, 2023. CT scan and bronchoscopy with EBUS will assess the tumor and lymph nodes pre-surgery. Explained 24% complete response rate, with some partial responses or progression. Emphasized the importance of the upcoming scan for determining next steps. - Administer fourth cycle of carboplatin, paclitaxel, and nivolumab - Order CT scan before surgery - Perform bronchoscopy with EBUS before surgery - Schedule surgery for April 19, 2023 - Evaluate pathology report post-surgery to determine the need for additional immunotherapy - Schedule follow-up appointment for end of April 2025  Left-Sided Chest Heaviness Intermittent left-sided heaviness when lying down, without pain, nausea, vomiting, or sweating. No exertional symptoms, making cardiac etiology less likely. EKG will be performed to rule out cardiac issues. - Perform EKG before infusion. It was unremarkable.  General Health Maintenance Oxygen saturation improved to 100% since smoking cessation in November 2024. Engaging in regular physical activity, including walking and bicycling. - Continue regular physical activity - Monitor oxygen saturation.   The patient was advised to call immediately if he has any concerning symptoms in the interval.  The patient voices understanding of current disease status and treatment options and is in agreement with the current care plan.  All questions were answered. The patient  knows to call the clinic with any problems, questions or concerns. We can certainly see the patient much sooner if necessary.  The total time spent in the appointment was 30 minutes.  Disclaimer: This note was dictated with voice recognition software. Similar sounding words can inadvertently be transcribed and may not be corrected upon review.

## 2023-03-01 NOTE — Patient Instructions (Signed)
 [facility] - A DEPT OF [facility]  Discharge Instructions: Thank you for choosing [facility] to provide your oncology and hematology care.   If you have a lab appointment with the Cancer Center, please go directly to the Cancer Center and check in at the registration area.   Wear comfortable clothing and clothing appropriate for easy access to any Portacath or PICC line.   We strive to give you quality time with your provider. You may need to reschedule your appointment if you arrive late (15 or more minutes).  Arriving late affects you and other patients whose appointments are after yours.  Also, if you miss three or more appointments without notifying the office, you may be dismissed from the clinic at the provider's discretion.      For prescription refill requests, have your pharmacy contact our office and allow 72 hours for refills to be completed.    Today you received the following chemotherapy and/or immunotherapy agents:   Nivolumab, Paclitaxel, Carboplatin      To help prevent nausea and vomiting after your treatment, we encourage you to take your nausea medication as directed.  BELOW ARE SYMPTOMS THAT SHOULD BE REPORTED IMMEDIATELY: *FEVER GREATER THAN 100.4 F (38 C) OR HIGHER *CHILLS OR SWEATING *NAUSEA AND VOMITING THAT IS NOT CONTROLLED WITH YOUR NAUSEA MEDICATION *UNUSUAL SHORTNESS OF BREATH *UNUSUAL BRUISING OR BLEEDING *URINARY PROBLEMS (pain or burning when urinating, or frequent urination) *BOWEL PROBLEMS (unusual diarrhea, constipation, pain near the anus) TENDERNESS IN MOUTH AND THROAT WITH OR WITHOUT PRESENCE OF ULCERS (sore throat, sores in mouth, or a toothache) UNUSUAL RASH, SWELLING OR PAIN  UNUSUAL VAGINAL DISCHARGE OR ITCHING   Items with * indicate a potential emergency and should be followed up as soon as possible or go to the Emergency Department if any problems should occur.  Please show the CHEMOTHERAPY  ALERT CARD or IMMUNOTHERAPY ALERT CARD at check-in to the Emergency Department and triage nurse.  Should you have questions after your visit or need to cancel or reschedule your appointment, please contact [facility] - A DEPT OF [facility]  Dept: [phone]  and follow the prompts.  Office hours are 8:00 a.m. to 4:30 p.m. Monday - Friday. Please note that voicemails left after 4:00 p.m. may not be returned until the following business day.  We are closed weekends and major holidays. You have access to a nurse at all times for urgent questions. Please call the main number to the clinic Dept: [phone] and follow the prompts.   For any non-urgent questions, you may also contact your provider using MyChart. We now offer e-Visits for anyone 15 and older to request care online for non-urgent symptoms. For details visit [email or web address].   Also download the MyChart app! Go to the app store, search "MyChart", open the app, select [place], and log in with your MyChart username and password.

## 2023-03-01 NOTE — Progress Notes (Signed)
 Pulmonary Individual Treatment Plan  Patient Details  Name: Raymond Velez MRN: [id number] Date of Birth: [birth year] Referring Provider:   Doristine Devoid Pulmonary Rehab Walk Test from 01/25/2023 in [facility]  Referring Provider Mikel Cella [facility])       Initial Encounter Date:  Flowsheet Row Pulmonary Rehab Walk Test from 01/25/2023 in [facility]  Date 01/25/23       Visit Diagnosis: Malignant neoplasm of upper lobe of left lung (HCC)  Patient's Home Medications on Admission:   Current Outpatient Medications:    atorvastatin (LIPITOR) 20 MG tablet, Take 20 mg by mouth daily., Disp: , Rfl:   Past Medical History: Past Medical History:  Diagnosis Date   Arthritis    Hyperlipidemia    RBBB     Tobacco Use: Social History   Tobacco Use  Smoking Status Former   Current packs/day: 0.00   Average packs/day: 1.5 packs/day for 15.1 years (22.7 ttl pk-yrs)   Types: Cigarettes   Start date: 11/22/2002   Quit date: 11/12/2022   Years since quitting: 0.2  Smokeless Tobacco Never    Labs: Review Flowsheet       Latest Ref Rng & Units 01/19/2013 02/20/2014 09/24/2014  Labs for ITP Cardiac and Pulmonary Rehab  Cholestrol 125 - 200 mg/dL 409  811  914   LDL (calc) <130 mg/dL 782  956  213   HDL-C >=08 mg/dL 37  34  29   Trlycerides <150 mg/dL 657  846  962   Hemoglobin A1c - - 5.7  5.8     Capillary Blood Glucose: Lab Results  Component Value Date   GLUCAP 89 11/17/2022     Pulmonary Assessment Scores:  Pulmonary Assessment Scores     Row Name 01/25/23 1310         ADL UCSD   ADL Phase Entry     SOB Score total 1       CAT Score   CAT Score 3       mMRC Score   mMRC Score 2             UCSD: Self-administered rating of dyspnea associated with activities of daily living (ADLs) 6-point scale (0 = "not at all" to 5 = "maximal or  unable to do because of breathlessness")  Scoring Scores range from 0 to 120.  Minimally important difference is 5 units  CAT: CAT can identify the health impairment of COPD patients and is better correlated with disease progression.  CAT has a scoring range of zero to 40. The CAT score is classified into four groups of low (less than 10), medium (10 - 20), high (21-30) and very high (31-40) based on the impact level of disease on health status. A CAT score over 10 suggests significant symptoms.  A worsening CAT score could be explained by an exacerbation, poor medication adherence, poor inhaler technique, or progression of COPD or comorbid conditions.  CAT MCID is 2 points  mMRC: mMRC (Modified Medical Research Council) Dyspnea Scale is used to assess the degree of baseline functional disability in patients of respiratory disease due to dyspnea. No minimal important difference is established. A decrease in score of 1 point or greater is considered a positive change.   Pulmonary Function Assessment:  Pulmonary Function Assessment - 01/25/23 1410       Breath   Bilateral Breath Sounds Decreased  Exercise Target Goals: Exercise Program Goal: Individual exercise prescription set using results from initial 6 min walk test and THRR while considering  patient's activity barriers and safety.   Exercise Prescription Goal: Initial exercise prescription builds to 30-45 minutes a day of aerobic activity, 2-3 days per week.  Home exercise guidelines will be given to patient during program as part of exercise prescription that the participant will acknowledge.  Activity Barriers & Risk Stratification:  Activity Barriers & Cardiac Risk Stratification - 01/25/23 1310       Activity Barriers & Cardiac Risk Stratification   Activity Barriers Deconditioning;Shortness of Breath;Muscular Weakness    Cardiac Risk Stratification Moderate             6 Minute Walk:  6 Minute Walk      Row Name 01/25/23 1403         6 Minute Walk   Phase Initial     Distance 1524 feet     Walk Time 6 minutes     # of Rest Breaks 0     MPH 2.89     METS 3.41     RPE 11     Perceived Dyspnea  0     VO2 Peak 11.93     Symptoms No     Resting HR 74 bpm     Resting BP 110/62     Resting Oxygen Saturation  98 %     Exercise Oxygen Saturation  during 6 min walk 94 %     Max Ex. HR 111 bpm     Max Ex. BP 140/60     2 Minute Post BP 110/60       Interval HR   1 Minute HR 89     2 Minute HR --  equipment failure     3 Minute HR 111     4 Minute HR 105     5 Minute HR 100     6 Minute HR 104     2 Minute Post HR 75     Interval Heart Rate? Yes       Interval Oxygen   Interval Oxygen? Yes     Baseline Oxygen Saturation % 98 %     1 Minute Oxygen Saturation % 97 %     1 Minute Liters of Oxygen 0 L     2 Minute Oxygen Saturation % --  equipment failure     2 Minute Liters of Oxygen 0 L     3 Minute Oxygen Saturation % 95 %     3 Minute Liters of Oxygen 0 L     4 Minute Oxygen Saturation % 95 %     4 Minute Liters of Oxygen 0 L     5 Minute Oxygen Saturation % 94 %     5 Minute Liters of Oxygen 0 L     6 Minute Oxygen Saturation % 98 %     6 Minute Liters of Oxygen 0 L     2 Minute Post Oxygen Saturation % 100 %     2 Minute Post Liters of Oxygen 0 L              Oxygen Initial Assessment:  Oxygen Initial Assessment - 01/25/23 1309       Home Oxygen   Home Oxygen Device None    Sleep Oxygen Prescription None    Home Exercise Oxygen Prescription None    Home Resting Oxygen Prescription None  Compliance with Home Oxygen Use Yes      Initial 6 min Walk   Oxygen Used None      Program Oxygen Prescription   Program Oxygen Prescription None      Intervention   Short Term Goals To learn and exhibit compliance with exercise, home and travel O2 prescription;To learn and understand importance of maintaining oxygen saturations>88%;To learn and understand  importance of monitoring SPO2 with pulse oximeter and demonstrate accurate use of the pulse oximeter.;To learn and demonstrate proper pursed lip breathing techniques or other breathing techniques. ;Other    Long  Term Goals Other;Exhibits proper breathing techniques, such as pursed lip breathing or other method taught during program session;Maintenance of O2 saturations>88%;Verbalizes importance of monitoring SPO2 with pulse oximeter and return demonstration;Exhibits compliance with exercise, home  and travel O2 prescription             Oxygen Re-Evaluation:  Oxygen Re-Evaluation     Row Name 01/25/23 1429 02/20/23 0910           Program Oxygen Prescription   Program Oxygen Prescription None None        Home Oxygen   Home Oxygen Device None None      Sleep Oxygen Prescription None None      Home Exercise Oxygen Prescription None None      Home Resting Oxygen Prescription None None      Compliance with Home Oxygen Use Yes Yes        Goals/Expected Outcomes   Short Term Goals To learn and exhibit compliance with exercise, home and travel O2 prescription;To learn and understand importance of maintaining oxygen saturations>88%;To learn and understand importance of monitoring SPO2 with pulse oximeter and demonstrate accurate use of the pulse oximeter.;To learn and demonstrate proper pursed lip breathing techniques or other breathing techniques. ;Other To learn and exhibit compliance with exercise, home and travel O2 prescription;To learn and understand importance of maintaining oxygen saturations>88%;To learn and understand importance of monitoring SPO2 with pulse oximeter and demonstrate accurate use of the pulse oximeter.;To learn and demonstrate proper pursed lip breathing techniques or other breathing techniques. ;Other      Long  Term Goals Other;Exhibits proper breathing techniques, such as pursed lip breathing or other method taught during program session;Maintenance of O2  saturations>88%;Verbalizes importance of monitoring SPO2 with pulse oximeter and return demonstration;Exhibits compliance with exercise, home  and travel O2 prescription Other;Exhibits proper breathing techniques, such as pursed lip breathing or other method taught during program session;Maintenance of O2 saturations>88%;Verbalizes importance of monitoring SPO2 with pulse oximeter and return demonstration;Exhibits compliance with exercise, home  and travel O2 prescription      Goals/Expected Outcomes Compliance and understanding of oxygen saturation monitoring and breathing techniques to decrease shortness of breath Compliance and understanding of oxygen saturation monitoring and breathing techniques to decrease shortness of breath               Oxygen Discharge (Final Oxygen Re-Evaluation):  Oxygen Re-Evaluation - 02/20/23 0910       Program Oxygen Prescription   Program Oxygen Prescription None      Home Oxygen   Home Oxygen Device None    Sleep Oxygen Prescription None    Home Exercise Oxygen Prescription None    Home Resting Oxygen Prescription None    Compliance with Home Oxygen Use Yes      Goals/Expected Outcomes   Short Term Goals To learn and exhibit compliance with exercise, home and travel O2 prescription;To learn and understand  importance of maintaining oxygen saturations>88%;To learn and understand importance of monitoring SPO2 with pulse oximeter and demonstrate accurate use of the pulse oximeter.;To learn and demonstrate proper pursed lip breathing techniques or other breathing techniques. ;Other    Long  Term Goals Other;Exhibits proper breathing techniques, such as pursed lip breathing or other method taught during program session;Maintenance of O2 saturations>88%;Verbalizes importance of monitoring SPO2 with pulse oximeter and return demonstration;Exhibits compliance with exercise, home  and travel O2 prescription    Goals/Expected Outcomes Compliance and understanding of  oxygen saturation monitoring and breathing techniques to decrease shortness of breath             Initial Exercise Prescription:  Initial Exercise Prescription - 01/25/23 1400       Date of Initial Exercise RX and Referring Provider   Date 01/25/23    Referring Provider [name]    Expected Discharge Date 04/20/23      Treadmill   MPH 2.2    Grade 0    Minutes 15    METs 3      Bike   Level 4    Minutes 15    METs 3      Prescription Details   Frequency (times per week) 2    Duration Progress to 30 minutes of continuous aerobic without signs/symptoms of physical distress      Intensity   THRR 40-80% of Max Heartrate 62-123    Ratings of Perceived Exertion 11-13    Perceived Dyspnea 0-4      Progression   Progression Continue to progress workloads to maintain intensity without signs/symptoms of physical distress.      Resistance Training   Training Prescription Yes    Weight black bands (7.3 lbs)    Reps 10-15             Perform Capillary Blood Glucose checks as needed.  Exercise Prescription Changes:   Exercise Prescription Changes     Row Name 01/31/23 0900 02/14/23 0900 02/21/23 0900 02/28/23 0900       Response to Exercise   Blood Pressure (Admit) 112/62 120/60 -- 100/64    Blood Pressure (Exercise) 142/78 154/80 -- 178/70    Blood Pressure (Exit) 108/70 114/68 -- 102/70    Heart Rate (Admit) 65 bpm 71 bpm -- 70 bpm    Heart Rate (Exercise) 114 bpm 132 bpm -- 126 bpm    Heart Rate (Exit) 84 bpm 90 bpm -- 86 bpm    Oxygen Saturation (Admit) 98 % 98 % -- 98 %    Oxygen Saturation (Exercise) 95 % 95 % -- 96 %    Oxygen Saturation (Exit) 96 % 96 % -- 97 %    Rating of Perceived Exertion (Exercise) 11 13 -- 13    Perceived Dyspnea (Exercise) 0 0 -- 0    Duration Continue with 30 min of aerobic exercise without signs/symptoms of physical distress. Continue with 30 min of aerobic exercise without signs/symptoms of physical distress. --  Continue with 30 min of aerobic exercise without signs/symptoms of physical distress.    Intensity THRR unchanged THRR unchanged -- THRR unchanged      Progression   Progression Continue to progress workloads to maintain intensity without signs/symptoms of physical distress. Continue to progress workloads to maintain intensity without signs/symptoms of physical distress. -- Continue to progress workloads to maintain intensity without signs/symptoms of physical distress.      Resistance Training   Training Prescription Yes Yes -- Yes  Weight black bands black bands -- black bands    Reps 10-15 10-15 -- 10-15    Time 10 Minutes 10 Minutes -- 10 Minutes      Treadmill   MPH 2.7 3 -- 3.3    Grade 0 3 -- 3    Minutes 15 15 -- 15    METs 2.9 4.4 -- 4.9      Bike   Level 4 4 -- 1.1    Minutes 15 15 -- 15    METs 4.4 4 -- 4.3      Home Exercise Plan   Plans to continue exercise at -- -- Home (comment) --    Frequency -- -- Add 1 additional day to program exercise sessions. --    Initial Home Exercises Provided -- -- 02/21/23 --             Exercise Comments:   Exercise Comments     Row Name 01/31/23 [phone] 02/21/23 0923         Exercise Comments Raymond Velez completed his first day of exericse. He exercised for 15 min on the upright bike and treadmill. Raymond Velez averaged 4.4 METs at level 4 on the upright bike and 2.7 METs at 2.7 mph on the treadmill. He performed the warmup and cooldown standing without any limitations. Discussed METs. Completed home exercise plan. Raymond Velez is currently exercising at home. He cycles 1-2 days/wk for 30 min/day. Raymond Velez also mentioned how he does work around [employer]. I explained to Raymond Velez the difference between activities of daily living and exercise. Aleric voiced understanding. I encouraged Raymond Velez to cycle or walk 2 non-rehab days/wk for 30 min/day. He agreed with my recommendations. Raymond Velez seems to understand the importance of exercising outside of rehab.                Exercise Goals and Review:   Exercise Goals     Row Name 01/25/23 1312             Exercise Goals   Increase Physical Activity Yes       Intervention Provide advice, education, support and counseling about physical activity/exercise needs.;Develop an individualized exercise prescription for aerobic and resistive training based on initial evaluation findings, risk stratification, comorbidities and participant's personal goals.       Expected Outcomes Short Term: Attend rehab on a regular basis to increase amount of physical activity.;Long Term: Add in home exercise to make exercise part of routine and to increase amount of physical activity.;Long Term: Exercising regularly at least 3-5 days a week.       Increase Strength and Stamina Yes       Intervention Provide advice, education, support and counseling about physical activity/exercise needs.;Develop an individualized exercise prescription for aerobic and resistive training based on initial evaluation findings, risk stratification, comorbidities and participant's personal goals.       Expected Outcomes Short Term: Increase workloads from initial exercise prescription for resistance, speed, and METs.;Short Term: Perform resistance training exercises routinely during rehab and add in resistance training at home;Long Term: Improve cardiorespiratory fitness, muscular endurance and strength as measured by increased METs and functional capacity ( )       Able to understand and use rate of perceived exertion (RPE) scale Yes       Intervention Provide education and explanation on how to use RPE scale       Expected Outcomes Short Term: Able to use RPE daily in rehab to express subjective intensity level;Long Term:  Able to  use RPE to guide intensity level when exercising independently       Able to understand and use Dyspnea scale Yes       Intervention Provide education and explanation on how to use Dyspnea scale       Expected  Outcomes Short Term: Able to use Dyspnea scale daily in rehab to express subjective sense of shortness of breath during exertion;Long Term: Able to use Dyspnea scale to guide intensity level when exercising independently       Knowledge and understanding of Target Heart Rate Range (THRR) Yes       Intervention Provide education and explanation of THRR including how the numbers were predicted and where they are located for reference       Expected Outcomes Short Term: Able to state/look up THRR;Long Term: Able to use THRR to govern intensity when exercising independently;Short Term: Able to use daily as guideline for intensity in rehab       Understanding of Exercise Prescription Yes       Intervention Provide education, explanation, and written materials on patient's individual exercise prescription       Expected Outcomes Short Term: Able to explain program exercise prescription;Long Term: Able to explain home exercise prescription to exercise independently                Exercise Goals Re-Evaluation :  Exercise Goals Re-Evaluation     Row Name 01/25/23 1428 02/20/23 0907           Exercise Goal Re-Evaluation   Exercise Goals Review Increase Physical Activity;Able to understand and use Dyspnea scale;Understanding of Exercise Prescription;Increase Strength and Stamina;Knowledge and understanding of Target Heart Rate Range (THRR);Able to understand and use rate of perceived exertion (RPE) scale Increase Physical Activity;Able to understand and use Dyspnea scale;Understanding of Exercise Prescription;Increase Strength and Stamina;Knowledge and understanding of Target Heart Rate Range (THRR);Able to understand and use rate of perceived exertion (RPE) scale      Comments Pt to begin exercise 1/21. Will monitor for progression. Xylan has completed 5 exercise sessions. He exercises for 15 min on the upright bike and treadmill. Raymond Velez averages 4.3 METs at level 4 on the upright bike and 4.7 METs at  3.2 mph and 3% incline on the treadmill. He has increased his workload for both exercise modes as he tolerates progressions well. We have not discussed home exercise yet, but I plan to talk to him soon. Will continue to monitor and progress as able.      Expected Outcomes Through exercise at rehab and at home, the patient will decrease shortness of breath with daily activities and feel confident in carrying out an exercise regime at home. Through exercise at rehab and at home, the patient will decrease shortness of breath with daily activities and feel confident in carrying out an exercise regime at home.               Discharge Exercise Prescription (Final Exercise Prescription Changes):  Exercise Prescription Changes - 02/28/23 0900       Response to Exercise   Blood Pressure (Admit) 100/64    Blood Pressure (Exercise) 178/70    Blood Pressure (Exit) 102/70    Heart Rate (Admit) 70 bpm    Heart Rate (Exercise) 126 bpm    Heart Rate (Exit) 86 bpm    Oxygen Saturation (Admit) 98 %    Oxygen Saturation (Exercise) 96 %    Oxygen Saturation (Exit) 97 %    Rating of Perceived  Exertion (Exercise) 13    Perceived Dyspnea (Exercise) 0    Duration Continue with 30 min of aerobic exercise without signs/symptoms of physical distress.    Intensity THRR unchanged      Progression   Progression Continue to progress workloads to maintain intensity without signs/symptoms of physical distress.      Resistance Training   Training Prescription Yes    Weight black bands    Reps 10-15    Time 10 Minutes      Treadmill   MPH 3.3    Grade 3    Minutes 15    METs 4.9      Bike   Level 1.1    Minutes 15    METs 4.3             Nutrition:  Target Goals: Understanding of nutrition guidelines, daily intake of sodium 1500mg , cholesterol 200mg , calories 30% from fat and 7% or less from saturated fats, daily to have 5 or more servings of fruits and  vegetables.  Biometrics:    Nutrition Therapy Plan and Nutrition Goals:   Nutrition Assessments:  MEDIFICTS Score Key: >=70 Need to make dietary changes  40-70 Heart Healthy Diet <= 40 Therapeutic Level Cholesterol Diet   Picture Your Plate Scores: <40 Unhealthy dietary pattern with much room for improvement. 41-50 Dietary pattern unlikely to meet recommendations for good health and room for improvement. 51-60 More healthful dietary pattern, with some room for improvement.  >60 Healthy dietary pattern, although there may be some specific behaviors that could be improved.    Nutrition Goals Re-Evaluation:   Nutrition Goals Discharge (Final Nutrition Goals Re-Evaluation):   Psychosocial: Target Goals: Acknowledge presence or absence of significant depression and/or stress, maximize coping skills, provide positive support system. Participant is able to verbalize types and ability to use techniques and skills needed for reducing stress and depression.  Initial Review & Psychosocial Screening:  Initial Psych Review & Screening - 01/25/23 1303       Initial Review   Current issues with None Identified      Family Dynamics   Good Support System? Yes    Comments Candida Peeling, wife. Daughter [name], son      Barriers   Psychosocial barriers to participate in program The patient should benefit from training in stress management and relaxation.      Screening Interventions   Interventions Encouraged to exercise    Expected Outcomes Short Term goal: Utilizing psychosocial counselor, staff and physician to assist with identification of specific Stressors or current issues interfering with healing process. Setting desired goal for each stressor or current issue identified.;Long Term Goal: Stressors or current issues are controlled or eliminated.;Short Term goal: Identification and review with participant of any Quality of Life or Depression concerns found by scoring the questionnaire.              Quality of Life Scores:  Scores of 19 and below usually indicate a poorer quality of life in these areas.  A difference of  2-3 points is a clinically meaningful difference.  A difference of 2-3 points in the total score of the Quality of Life Index has been associated with significant improvement in overall quality of life, self-image, physical symptoms, and general health in studies assessing change in quality of life.  PHQ-9: Review Flowsheet       01/25/2023 09/24/2014  Depression screen PHQ 2/9  Decreased Interest 0 0  Down, Depressed, Hopeless 0 0  PHQ - 2 Score  0 0  Altered sleeping 0 -  Tired, decreased energy 0 -  Change in appetite 0 -  Feeling bad or failure about yourself  0 -  Trouble concentrating 0 -  Moving slowly or fidgety/restless 0 -  Suicidal thoughts 0 -  PHQ-9 Score 0 -  Difficult doing work/chores Not difficult at all -   Interpretation of Total Score  Total Score Depression Severity:  1-4 = Minimal depression, 5-9 = Mild depression, 10-14 = Moderate depression, 15-19 = Moderately severe depression, 20-27 = Severe depression   Psychosocial Evaluation and Intervention:  Psychosocial Evaluation - 01/25/23 1305       Psychosocial Evaluation & Interventions   Interventions Encouraged to exercise with the program and follow exercise prescription    Expected Outcomes For pt to participate in program    Continue Psychosocial Services  Follow up required by staff             Psychosocial Re-Evaluation:  Psychosocial Re-Evaluation     Row Name 01/27/23 1525 02/22/23 1433           Psychosocial Re-Evaluation   Current issues with None Identified None Identified      Comments No changes since orientation. Raymond Velez is scheduled to start the program on 02/02/23. At monthly psy/soc re-eval, Raymond Velez continues to deny psy/soc concerns. He has great support from his wife and children.      Expected Outcomes For Raymond Velez to participate in PR free or  barriers or concerns For Raymond Velez to continue to participate in PR free or barriers or concerns      Interventions Encouraged to attend Pulmonary Rehabilitation for the exercise Encouraged to attend Pulmonary Rehabilitation for the exercise      Continue Psychosocial Services  No Follow up required No Follow up required               Psychosocial Discharge (Final Psychosocial Re-Evaluation):  Psychosocial Re-Evaluation - 02/22/23 1433       Psychosocial Re-Evaluation   Current issues with None Identified    Comments At monthly psy/soc re-eval, Raymond Velez continues to deny psy/soc concerns. He has great support from his wife and children.    Expected Outcomes For Raymond Velez to continue to participate in PR free or barriers or concerns    Interventions Encouraged to attend Pulmonary Rehabilitation for the exercise    Continue Psychosocial Services  No Follow up required             Education: Education Goals: Education classes will be provided on a weekly basis, covering required topics. Participant will state understanding/return demonstration of topics presented.  Learning Barriers/Preferences:  Learning Barriers/Preferences - 01/25/23 1306       Learning Barriers/Preferences   Learning Barriers None    Learning Preferences Audio             Education Topics: Know Your Numbers Group instruction that is supported by a PowerPoint presentation. Instructor discusses importance of knowing and understanding resting, exercise, and post-exercise oxygen saturation, heart rate, and blood pressure. Oxygen saturation, heart rate, blood pressure, rating of perceived exertion, and dyspnea are reviewed along with a normal range for these values.    Exercise for the Pulmonary Patient Group instruction that is supported by a PowerPoint presentation. Instructor discusses benefits of exercise, core components of exercise, frequency, duration, and intensity of an exercise routine, importance of  utilizing pulse oximetry during exercise, safety while exercising, and options of places to exercise outside of rehab.    MET Level  Group instruction provided by PowerPoint, verbal discussion, and written material to support subject matter. Instructor reviews what METs are and how to increase METs.    Pulmonary Medications Verbally interactive group education provided by instructor with focus on inhaled medications and proper administration.   Anatomy and Physiology of the Respiratory System Group instruction provided by PowerPoint, verbal discussion, and written material to support subject matter. Instructor reviews respiratory cycle and anatomical components of the respiratory system and their functions. Instructor also reviews differences in obstructive and restrictive respiratory diseases with examples of each.    Oxygen Safety Group instruction provided by PowerPoint, verbal discussion, and written material to support subject matter. There is an overview of "What is Oxygen" and "Why do we need it".  Instructor also reviews how to create a safe environment for oxygen use, the importance of using oxygen as prescribed, and the risks of noncompliance. There is a brief discussion on traveling with oxygen and resources the patient may utilize.   Oxygen Use Group instruction provided by PowerPoint, verbal discussion, and written material to discuss how supplemental oxygen is prescribed and different types of oxygen supply systems. Resources for more information are provided.    Breathing Techniques Group instruction that is supported by demonstration and informational handouts. Instructor discusses the benefits of pursed lip and diaphragmatic breathing and detailed demonstration on how to perform both.  Flowsheet Row PULMONARY REHAB OTHER RESPIRATORY from 02/02/2023 in [facility], V[facility], & Lung [facility]  Date 02/02/23  Educator RN  Instruction Review Code  1- Verbalizes Understanding        Risk Factor Reduction Group instruction that is supported by a PowerPoint presentation. Instructor discusses the definition of a risk factor, different risk factors for pulmonary disease, and how the heart and lungs work together. Flowsheet Row PULMONARY REHAB OTHER RESPIRATORY from 02/23/2023 in [facility] for Heart, Vascular, & Lung Health  Date 02/23/23  Educator EP  Instruction Review Code 1- Verbalizes Understanding       Pulmonary Diseases Group instruction provided by PowerPoint, verbal discussion, and written material to support subject matter. Instructor gives an overview of the different type of pulmonary diseases. There is also a discussion on risk factors and symptoms as well as ways to manage the diseases.   Stress and Energy Conservation Group instruction provided by PowerPoint, verbal discussion, and written material to support subject matter. Instructor gives an overview of stress and the impact it can have on the body. Instructor also reviews ways to reduce stress. There is also a discussion on energy conservation and ways to conserve energy throughout the day.   Warning Signs and Symptoms Group instruction provided by PowerPoint, verbal discussion, and written material to support subject matter. Instructor reviews warning signs and symptoms of stroke, heart attack, cold and flu. Instructor also reviews ways to prevent the spread of infection. Flowsheet Row PULMONARY REHAB OTHER RESPIRATORY from 02/16/2023 in [facility] for Heart, Vascular, & Lung Health  Date 02/16/23  Educator RN  Instruction Review Code 1- Verbalizes Understanding       Other Education Group or individual verbal, written, or video instructions that support the educational goals of the pulmonary rehab program.    Knowledge Questionnaire Score:  Knowledge Questionnaire Score - 01/25/23 1327       Knowledge  Questionnaire Score   Pre Score 11/18             Core Components/Risk Factors/Patient Goals at  Admission:  Personal Goals and Risk Factors at Admission - 01/25/23 1307       Core Components/Risk Factors/Patient Goals on Admission   Improve shortness of breath with ADL's Yes    Intervention Provide education, individualized exercise plan and daily activity instruction to help decrease symptoms of SOB with activities of daily living.    Expected Outcomes Short Term: Improve cardiorespiratory fitness to achieve a reduction of symptoms when performing ADLs;Long Term: Be able to perform more ADLs without symptoms or delay the onset of symptoms             Core Components/Risk Factors/Patient Goals Review:   Goals and Risk Factor Review     Row Name 01/30/23 0929 02/22/23 1435           Core Components/Risk Factors/Patient Goals Review   Personal Goals Review Improve shortness of breath with ADL's;Develop more efficient breathing techniques such as purse lipped breathing and diaphragmatic breathing and practicing self-pacing with activity. Improve shortness of breath with ADL's;Develop more efficient breathing techniques such as purse lipped breathing and diaphragmatic breathing and practicing self-pacing with activity.      Review Unable to asses goals, Raymond Velez is scheduled to start the program on 01/31/23 Monthly review of patient's Core Components/Risk Factors/Patient Goals are as follows: Goal progressing on improving Raymond Velez's shortness of breath with ADLs. Raymond Velez has been able to increase his METS and workload on the treadmill and upright bike. He can correctly voice his dyspnea and exertion scale scores to staff. Raymond Velez is progressing on developing more efficient breathing techniques such as purse lipped breathing and diaphragmatic breathing; and practicing self-pacing with activity. Raymond Velez can demonstrate purse lipped breathing and can self-pace but needs assistance with diaphragmatic  breathing. We are working on this with him. Raymond Velez will continue to benefit from participation in PR for nutrition, education, exercise, and lifestyle modification.      Expected Outcomes For Raymond Velez to improve shortness of breath with ADL's and develop more efficient breathing techniques such as purse lipped breathing and diaphragmatic breathing; and practicing self-pacing with activity and improve shortness of breath with ADL's For Raymond Velez to improve shortness of breath with ADL's and develop more efficient breathing techniques such as purse lipped breathing and diaphragmatic breathing; and practicing self-pacing with activity and improve shortness of breath with ADL's               Core Components/Risk Factors/Patient Goals at Discharge (Final Review):   Goals and Risk Factor Review - 02/22/23 1435       Core Components/Risk Factors/Patient Goals Review   Personal Goals Review Improve shortness of breath with ADL's;Develop more efficient breathing techniques such as purse lipped breathing and diaphragmatic breathing and practicing self-pacing with activity.    Review Monthly review of patient's Core Components/Risk Factors/Patient Goals are as follows: Goal progressing on improving Raymond Velez's shortness of breath with ADLs. Raymond Velez has been able to increase his METS and workload on the treadmill and upright bike. He can correctly voice his dyspnea and exertion scale scores to staff. Raymond Velez is progressing on developing more efficient breathing techniques such as purse lipped breathing and diaphragmatic breathing; and practicing self-pacing with activity. Raymond Velez can demonstrate purse lipped breathing and can self-pace but needs assistance with diaphragmatic breathing. We are working on this with him. Khyan will continue to benefit from participation in PR for nutrition, education, exercise, and lifestyle modification.    Expected Outcomes For Woods to improve shortness of breath with ADL's and develop more  efficient  breathing techniques such as purse lipped breathing and diaphragmatic breathing; and practicing self-pacing with activity and improve shortness of breath with ADL's             ITP Comments: Pt is making expected progress toward Pulmonary Rehab goals after completing 8 session(s). Recommend continued exercise, life style modification, education, and utilization of breathing techniques to increase stamina and strength, while also decreasing shortness of breath with exertion.  Dr. Mechele Collin is Medical Director for Pulmonary Rehab at [facility].

## 2023-03-02 ENCOUNTER — Encounter (HOSPITAL_COMMUNITY): Payer: HMO

## 2023-03-02 ENCOUNTER — Telehealth: Payer: Self-pay | Admitting: Internal Medicine

## 2023-03-02 LAB — T4: T4, Total: 8.2 ug/dL (ref 4.5–12.0)

## 2023-03-02 NOTE — Telephone Encounter (Signed)
 Marland Kitchen

## 2023-03-03 ENCOUNTER — Inpatient Hospital Stay: Payer: HMO

## 2023-03-03 ENCOUNTER — Other Ambulatory Visit: Payer: Self-pay

## 2023-03-03 VITALS — BP 128/77 | HR 73 | Temp 97.9°F | Resp 18

## 2023-03-03 DIAGNOSIS — Z5112 Encounter for antineoplastic immunotherapy: Secondary | ICD-10-CM | POA: Diagnosis not present

## 2023-03-03 DIAGNOSIS — C3412 Malignant neoplasm of upper lobe, left bronchus or lung: Secondary | ICD-10-CM

## 2023-03-03 MED ORDER — PEGFILGRASTIM-FPGK 6 MG/0.6ML ~~LOC~~ SOSY
6.0000 mg | PREFILLED_SYRINGE | Freq: Once | SUBCUTANEOUS | Status: AC
  Administered 2023-03-03: 6 mg via SUBCUTANEOUS
  Filled 2023-03-03: qty 0.6

## 2023-03-07 ENCOUNTER — Encounter (HOSPITAL_COMMUNITY): Admission: RE | Admit: 2023-03-07 | Payer: HMO | Source: Ambulatory Visit

## 2023-03-07 ENCOUNTER — Telehealth (HOSPITAL_COMMUNITY): Payer: Self-pay | Admitting: *Deleted

## 2023-03-07 NOTE — Telephone Encounter (Signed)
 Patient left message on department VM this morning. Unable to attend Pulmonary Rehab today, not feeling well.

## 2023-03-08 ENCOUNTER — Inpatient Hospital Stay: Payer: HMO

## 2023-03-08 DIAGNOSIS — C3490 Malignant neoplasm of unspecified part of unspecified bronchus or lung: Secondary | ICD-10-CM

## 2023-03-08 DIAGNOSIS — Z5112 Encounter for antineoplastic immunotherapy: Secondary | ICD-10-CM | POA: Diagnosis not present

## 2023-03-08 LAB — CMP (CANCER CENTER ONLY)
ALT: 35 U/L (ref 0–44)
AST: 20 U/L (ref 15–41)
Albumin: 4.1 g/dL (ref 3.5–5.0)
Alkaline Phosphatase: 100 U/L (ref 38–126)
Anion gap: 6 (ref 5–15)
BUN: 12 mg/dL (ref 8–23)
CO2: 28 mmol/L (ref 22–32)
Calcium: 9.4 mg/dL (ref 8.9–10.3)
Chloride: 103 mmol/L (ref 98–111)
Creatinine: 0.76 mg/dL (ref 0.61–1.24)
GFR, Estimated: >60 mL/min (ref >60)
Glucose, Bld: 89 mg/dL (ref 70–99)
Potassium: 4 mmol/L (ref 3.5–5.1)
Sodium: 137 mmol/L (ref 135–145)
Total Bilirubin: 0.4 mg/dL (ref 0.0–1.2)
Total Protein: 7.2 g/dL (ref 6.5–8.1)

## 2023-03-08 LAB — CBC WITH DIFFERENTIAL (CANCER CENTER ONLY)
Abs Immature Granulocytes: 0.1 10*3/uL — ABNORMAL HIGH (ref 0.00–0.07)
Basophils Absolute: 0.1 10*3/uL (ref 0.0–0.1)
Basophils Relative: 1 %
Eosinophils Absolute: 0.2 10*3/uL (ref 0.0–0.5)
Eosinophils Relative: 2 %
HCT: 30.9 % — ABNORMAL LOW (ref 39.0–52.0)
Hemoglobin: 10.3 g/dL — ABNORMAL LOW (ref 13.0–17.0)
Immature Granulocytes: 1 %
Lymphocytes Relative: 21 %
Lymphs Abs: 2.8 10*3/uL (ref 0.7–4.0)
MCH: 31.1 pg (ref 26.0–34.0)
MCHC: 33.3 g/dL (ref 30.0–36.0)
MCV: 93.4 fL (ref 80.0–100.0)
Monocytes Absolute: 1.5 10*3/uL — ABNORMAL HIGH (ref 0.1–1.0)
Monocytes Relative: 11 %
Neutro Abs: 8.4 10*3/uL — ABNORMAL HIGH (ref 1.7–7.7)
Neutrophils Relative %: 64 %
Platelet Count: 183 10*3/uL (ref 150–400)
RBC: 3.31 MIL/uL — ABNORMAL LOW (ref 4.22–5.81)
RDW: 15.1 % (ref 11.5–15.5)
WBC Count: 13 10*3/uL — ABNORMAL HIGH (ref 4.0–10.5)
nRBC: 0 % (ref 0.0–0.2)

## 2023-03-09 ENCOUNTER — Encounter (HOSPITAL_COMMUNITY)
Admission: RE | Admit: 2023-03-09 | Discharge: 2023-03-09 | Disposition: A | Discharge status: Home / Self Care | Payer: HMO | Source: Ambulatory Visit | Attending: Pulmonary Disease | Admitting: Pulmonary Disease

## 2023-03-09 VITALS — Wt 200.6 lb

## 2023-03-09 DIAGNOSIS — C3412 Malignant neoplasm of upper lobe, left bronchus or lung: Secondary | ICD-10-CM | POA: Diagnosis not present

## 2023-03-09 NOTE — Progress Notes (Signed)
 Daily Session Note  Patient Details  Name: Raymond Velez MRN: [id number] Date of Birth: 02/13/1955 Referring Provider:   Doristine Devoid Pulmonary Rehab Walk Test from 01/25/2023 in [facility] for Heart, Vascular, & Lung Health  Referring Provider [name]       Encounter Date: 03/09/2023  Check In:  Session Check In - 03/09/23 0850       Check-In   Supervising physician immediately available to respond to emergencies [facility] MD immediately available    Physician(s) Bernadene Person, NP    Location [facility]    Staff Present Raford Pitcher, MS, ACSM-CEP, Exercise Physiologist;Sherwin Hollingshed Erin Sons BS, ACSM-CEP, Exercise Physiologist;Mary Gerre Scull, RN, BSN    Virtual Visit No    Medication changes reported     No    Fall or balance concerns reported    No    Tobacco Cessation No Change    Warm-up and Cool-down Performed as group-led instruction   orientation only   Resistance Training Performed Yes    VAD Patient? No    PAD/SET Patient? No      Pain Assessment   Currently in Pain? No/denies    Multiple Pain Sites No             Capillary Blood Glucose: Results for orders placed or performed in visit on 03/08/23 (from the past 24 hours)  CMP (Cancer Center only)     Status: None   Collection Time: 03/08/23  1:20 PM  Result Value Ref Range   Sodium 137 135 - 145 mmol/L   Potassium 4.0 3.5 - 5.1 mmol/L   Chloride 103 98 - 111 mmol/L   CO2 28 22 - 32 mmol/L   Glucose, Bld 89 70 - 99 mg/dL   BUN 12 8 - 23 mg/dL   Creatinine 4.03 4.74 - 1.24 mg/dL   Calcium 9.4 8.9 - 25.9 mg/dL   Total Protein 7.2 6.5 - 8.1 g/dL   Albumin 4.1 3.5 - 5.0 g/dL   AST 20 15 - 41 U/L   ALT 35 0 - 44 U/L   Alkaline Phosphatase 100 38 - 126 U/L   Total Bilirubin 0.4 0.0 - 1.2 mg/dL   GFR, Estimated >56 >38 mL/min   Anion gap 6 5 - 15  CBC with Differential (Cancer Center Only)     Status: Abnormal   Collection Time: 03/08/23  1:20 PM   Result Value Ref Range   WBC Count 13.0 (H) 4.0 - 10.5 K/uL   RBC 3.31 (L) 4.22 - 5.81 MIL/uL   Hemoglobin 10.3 (L) 13.0 - 17.0 g/dL   HCT 75.6 (L) 43.3 - 29.5 %   MCV 93.4 80.0 - 100.0 fL   MCH 31.1 26.0 - 34.0 pg   MCHC 33.3 30.0 - 36.0 g/dL   RDW 18.8 41.6 - 60.6 %   Platelet Count 183 150 - 400 K/uL   nRBC 0.0 0.0 - 0.2 %   Neutrophils Relative % 64 %   Neutro Abs 8.4 (H) 1.7 - 7.7 K/uL   Lymphocytes Relative 21 %   Lymphs Abs 2.8 0.7 - 4.0 K/uL   Monocytes Relative 11 %   Monocytes Absolute 1.5 (H) 0.1 - 1.0 K/uL   Eosinophils Relative 2 %   Eosinophils Absolute 0.2 0.0 - 0.5 K/uL   Basophils Relative 1 %   Basophils Absolute 0.1 0.0 - 0.1 K/uL   Immature Granulocytes 1 %   Abs Immature Granulocytes 0.10 (H) 0.00 -  0.07 K/uL      Social History   Tobacco Use  Smoking Status Former   Current packs/day: 0.00   Average packs/day: 1.5 packs/day for 15.1 years (22.7 ttl pk-yrs)   Types: Cigarettes   Start date: 11/22/2002   Quit date: 11/12/2022   Years since quitting: 0.3  Smokeless Tobacco Never    Goals Met:  Proper associated with RPD/PD & O2 Sat Independence with exercise equipment Exercise tolerated well No report of concerns or symptoms today Strength training completed today  Goals Unmet:  Not Applicable  Comments: Service time is from 0807 to 0934.    Dr. Mechele Collin is Medical Director for Pulmonary Rehab at [facility].

## 2023-03-14 ENCOUNTER — Encounter (HOSPITAL_COMMUNITY): Admission: RE | Admit: 2023-03-14 | Payer: HMO | Source: Ambulatory Visit

## 2023-03-14 ENCOUNTER — Telehealth (HOSPITAL_COMMUNITY): Payer: Self-pay | Admitting: *Deleted

## 2023-03-14 NOTE — Telephone Encounter (Signed)
 Patient left message on department voicemail this morning. He's not feeling well and will be absent from pulmonary rehab today.

## 2023-03-15 ENCOUNTER — Inpatient Hospital Stay: Payer: Medicare Other | Attending: Internal Medicine

## 2023-03-15 DIAGNOSIS — Z9221 Personal history of antineoplastic chemotherapy: Secondary | ICD-10-CM | POA: Diagnosis not present

## 2023-03-15 DIAGNOSIS — C3412 Malignant neoplasm of upper lobe, left bronchus or lung: Secondary | ICD-10-CM | POA: Diagnosis not present

## 2023-03-15 LAB — CBC WITH DIFFERENTIAL (CANCER CENTER ONLY)
Abs Immature Granulocytes: 0.09 10*3/uL — ABNORMAL HIGH (ref 0.00–0.07)
Basophils Absolute: 0.1 10*3/uL (ref 0.0–0.1)
Basophils Relative: 1 %
Eosinophils Absolute: 0.1 10*3/uL (ref 0.0–0.5)
Eosinophils Relative: 1 %
HCT: 32.4 % — ABNORMAL LOW (ref 39.0–52.0)
Hemoglobin: 10.7 g/dL — ABNORMAL LOW (ref 13.0–17.0)
Immature Granulocytes: 1 %
Lymphocytes Relative: 25 %
Lymphs Abs: 2.8 10*3/uL (ref 0.7–4.0)
MCH: 31.3 pg (ref 26.0–34.0)
MCHC: 33 g/dL (ref 30.0–36.0)
MCV: 94.7 fL (ref 80.0–100.0)
Monocytes Absolute: 1 10*3/uL (ref 0.1–1.0)
Monocytes Relative: 9 %
Neutro Abs: 7.4 10*3/uL (ref 1.7–7.7)
Neutrophils Relative %: 63 %
Platelet Count: 204 10*3/uL (ref 150–400)
RBC: 3.42 MIL/uL — ABNORMAL LOW (ref 4.22–5.81)
RDW: 16.1 % — ABNORMAL HIGH (ref 11.5–15.5)
WBC Count: 11.5 10*3/uL — ABNORMAL HIGH (ref 4.0–10.5)
nRBC: 0 % (ref 0.0–0.2)

## 2023-03-15 LAB — CMP (CANCER CENTER ONLY)
ALT: 32 U/L (ref 0–44)
AST: 18 U/L (ref 15–41)
Albumin: 4.3 g/dL (ref 3.5–5.0)
Alkaline Phosphatase: 85 U/L (ref 38–126)
Anion gap: 7 (ref 5–15)
BUN: 13 mg/dL (ref 8–23)
CO2: 27 mmol/L (ref 22–32)
Calcium: 9 mg/dL (ref 8.9–10.3)
Chloride: 103 mmol/L (ref 98–111)
Creatinine: 0.75 mg/dL (ref 0.61–1.24)
GFR, Estimated: >60 mL/min (ref >60)
Glucose, Bld: 87 mg/dL (ref 70–99)
Potassium: 4.1 mmol/L (ref 3.5–5.1)
Sodium: 137 mmol/L (ref 135–145)
Total Bilirubin: 0.4 mg/dL (ref 0.0–1.2)
Total Protein: 7.5 g/dL (ref 6.5–8.1)

## 2023-03-16 ENCOUNTER — Encounter (HOSPITAL_COMMUNITY)
Admission: RE | Admit: 2023-03-16 | Discharge: 2023-03-16 | Disposition: A | Discharge status: Home / Self Care | Payer: HMO | Source: Ambulatory Visit | Attending: Pulmonary Disease | Admitting: Pulmonary Disease

## 2023-03-16 DIAGNOSIS — C3412 Malignant neoplasm of upper lobe, left bronchus or lung: Secondary | ICD-10-CM | POA: Insufficient documentation

## 2023-03-16 NOTE — Progress Notes (Signed)
 Daily Session Note  Patient Details  Name: Raymond Velez MRN: [id number] Date of Birth: 03/13/1955 Referring Provider:   Doristine Devoid Pulmonary Rehab Walk Test from 01/25/2023 in [facility] for Heart, Vascular, & Lung Health  Referring Provider [name]       Encounter Date: 03/16/2023  Check In:  Session Check In - 03/16/23 7209       Check-In   Supervising physician immediately available to respond to emergencies [facility] MD immediately available    Physician(s) Robin Searing, NP    [facility]    Staff Present Raford Pitcher, MS, ACSM-CEP, Exercise Physiologist;Neymar Dowe Erin Sons BS, ACSM-CEP, Exercise Physiologist;Mary Gerre Scull, RN, BSN    Virtual Visit No    Medication changes reported     No    Fall or balance concerns reported    No    Tobacco Cessation No Change    Warm-up and Cool-down Performed as group-led instruction    Resistance Training Performed Yes    VAD Patient? No    PAD/SET Patient? No      Pain Assessment   Currently in Pain? No/denies             Capillary Blood Glucose: Results for orders placed or performed in visit on 03/15/23 (from the past 24 hours)  CMP (Cancer Center only)     Status: None   Collection Time: 03/15/23  1:01 PM  Result Value Ref Range   Sodium 137 135 - 145 mmol/L   Potassium 4.1 3.5 - 5.1 mmol/L   Chloride 103 98 - 111 mmol/L   CO2 27 22 - 32 mmol/L   Glucose, Bld 87 70 - 99 mg/dL   BUN 13 8 - 23 mg/dL   Creatinine 4.70 9.62 - 1.24 mg/dL   Calcium 9.0 8.9 - 83.6 mg/dL   Total Protein 7.5 6.5 - 8.1 g/dL   Albumin 4.3 3.5 - 5.0 g/dL   AST 18 15 - 41 U/L   ALT 32 0 - 44 U/L   Alkaline Phosphatase 85 38 - 126 U/L   Total Bilirubin 0.4 0.0 - 1.2 mg/dL   GFR, Estimated >62 >94 mL/min   Anion gap 7 5 - 15  CBC with Differential (Cancer Center Only)     Status: Abnormal   Collection Time: 03/15/23  1:01 PM  Result Value Ref Range   WBC Count 11.5 (H) 4.0 -  10.5 K/uL   RBC 3.42 (L) 4.22 - 5.81 MIL/uL   Hemoglobin 10.7 (L) 13.0 - 17.0 g/dL   HCT 76.5 (L) 46.5 - 03.5 %   MCV 94.7 80.0 - 100.0 fL   MCH 31.3 26.0 - 34.0 pg   MCHC 33.0 30.0 - 36.0 g/dL   RDW 46.5 (H) 68.1 - 27.5 %   Platelet Count 204 150 - 400 K/uL   nRBC 0.0 0.0 - 0.2 %   Neutrophils Relative % 63 %   Neutro Abs 7.4 1.7 - 7.7 K/uL   Lymphocytes Relative 25 %   Lymphs Abs 2.8 0.7 - 4.0 K/uL   Monocytes Relative 9 %   Monocytes Absolute 1.0 0.1 - 1.0 K/uL   Eosinophils Relative 1 %   Eosinophils Absolute 0.1 0.0 - 0.5 K/uL   Basophils Relative 1 %   Basophils Absolute 0.1 0.0 - 0.1 K/uL   Immature Granulocytes 1 %   Abs Immature Granulocytes 0.09 (H) 0.00 - 0.07 K/uL      Social History  Tobacco Use  Smoking Status Former   Current packs/day: 0.00   Average packs/day: 1.5 packs/day for 15.1 years (22.7 ttl pk-yrs)   Types: Cigarettes   Start date: 11/22/2002   Quit date: 11/12/2022   Years since quitting: 0.3  Smokeless Tobacco Never    Goals Met:  Proper associated with RPD/PD & O2 Sat Independence with exercise equipment Exercise tolerated well No report of concerns or symptoms today Strength training completed today  Goals Unmet:  Not Applicable  Comments: Service time is from 0810 to 0939.    Dr. Mechele Collin is Medical Director for Pulmonary Rehab at [facility].

## 2023-03-21 ENCOUNTER — Encounter (HOSPITAL_COMMUNITY)
Admission: RE | Admit: 2023-03-21 | Discharge: 2023-03-21 | Disposition: A | Discharge status: Home / Self Care | Payer: HMO | Source: Ambulatory Visit | Attending: Pulmonary Disease | Admitting: Pulmonary Disease

## 2023-03-21 DIAGNOSIS — C3412 Malignant neoplasm of upper lobe, left bronchus or lung: Secondary | ICD-10-CM

## 2023-03-21 NOTE — Progress Notes (Signed)
 Daily Session Note  Patient Details  Name: Raymond Velez MRN: [id number] Date of Birth: 1955/09/06 Referring Provider:   Doristine Devoid Pulmonary Rehab Walk Test from 01/25/2023 in [name] for Heart, Vascular, & Lung Health  Referring Provider [name]       Encounter Date: 03/21/2023  Check In:  Session Check In - 03/21/23 4098       Check-In   Supervising physician immediately available to respond to emergencies [facility] immediately available    Physician(s) Tereso Newcomer, PA    Staff Present Raford Pitcher, MS, ACSM-CEP, Exercise Physiologist;Jocilyn Trego Erin Sons BS, ACSM-CEP, Exercise Physiologist    Virtual Visit No    Medication changes reported     No    Fall or balance concerns reported    No    Tobacco Cessation No Change    Warm-up and Cool-down Performed as group-led instruction    Resistance Training Performed Yes    VAD Patient? No    PAD/SET Patient? No      Pain Assessment   Currently in Pain? No/denies             Capillary Blood Glucose: No results found for this or any previous visit (from the past 24 hours).    Social History   Tobacco Use  Smoking Status Former   Current packs/day: 0.00   Average packs/day: 1.5 packs/day for 15.1 years (22.7 ttl pk-yrs)   Types: Cigarettes   Start date: 11/22/2002   Quit date: 11/12/2022   Years since quitting: 0.3  Smokeless Tobacco Never    Goals Met:  Proper associated with RPD/PD & O2 Sat Independence with exercise equipment Exercise tolerated well No report of concerns or symptoms today Strength training completed today  Goals Unmet:  Not Applicable  Comments: Service time is from 0805 to 0905.    Dr. Mechele Collin is Medical Director for Pulmonary Rehab at [facility].

## 2023-03-23 ENCOUNTER — Encounter (HOSPITAL_COMMUNITY)
Admission: RE | Admit: 2023-03-23 | Discharge: 2023-03-23 | Disposition: A | Discharge status: Home / Self Care | Payer: HMO | Source: Ambulatory Visit | Attending: Pulmonary Disease | Admitting: Pulmonary Disease

## 2023-03-23 DIAGNOSIS — C3412 Malignant neoplasm of upper lobe, left bronchus or lung: Secondary | ICD-10-CM | POA: Diagnosis not present

## 2023-03-23 NOTE — Progress Notes (Signed)
 Daily Session Note  Patient Details  Name: Raymond Velez MRN: [id number] Date of Birth: [birth year] Referring Provider:   Doristine Devoid Pulmonary Rehab Walk Test from 01/25/2023 in [facility] for Heart, Vascular, & Lung Health  Referring Provider [name]       Encounter Date: 03/23/2023  Check In:  Session Check In - 03/23/23 0853       Check-In   Supervising physician immediately available to respond to emergencies [facility] MD immediately available    Physician(s) Tereso Newcomer, PA    [facility]    Staff Present Raford Pitcher, MS, ACSM-CEP, Exercise Physiologist;Ledora Delker Erin Sons BS, ACSM-CEP, Exercise Physiologist;Samantha Belarus, RD, LDN    Virtual Visit No    Medication changes reported     No    Fall or balance concerns reported    No    Tobacco Cessation No Change    Warm-up and Cool-down Performed as group-led instruction    Resistance Training Performed Yes    VAD Patient? No    PAD/SET Patient? No      Pain Assessment   Currently in Pain? No/denies    Pain Score 0-No pain    Multiple Pain Sites No             Capillary Blood Glucose: No results found for this or any previous visit (from the past 24 hours).    Social History   Tobacco Use  Smoking Status Former   Current packs/day: 0.00   Average packs/day: 1.5 packs/day for 15.1 years (22.7 ttl pk-yrs)   Types: Cigarettes   Start date: 11/22/2002   Quit date: 11/12/2022   Years since quitting: 0.3  Smokeless Tobacco Never    Goals Met:  Proper associated with RPD/PD & O2 Sat Independence with exercise equipment Exercise tolerated well No report of concerns or symptoms today Strength training completed today  Goals Unmet:  Not Applicable  Comments: Service time is from 0814 to 0932.    Dr. Mechele Collin is Medical Director for Pulmonary Rehab at [facility].

## 2023-03-24 DIAGNOSIS — I517 Cardiomegaly: Secondary | ICD-10-CM | POA: Diagnosis not present

## 2023-03-24 DIAGNOSIS — C3412 Malignant neoplasm of upper lobe, left bronchus or lung: Secondary | ICD-10-CM | POA: Diagnosis not present

## 2023-03-24 DIAGNOSIS — Z01818 Encounter for other preprocedural examination: Secondary | ICD-10-CM | POA: Diagnosis not present

## 2023-03-24 DIAGNOSIS — I2510 Atherosclerotic heart disease of native coronary artery without angina pectoris: Secondary | ICD-10-CM | POA: Diagnosis not present

## 2023-03-24 DIAGNOSIS — Z79899 Other long term (current) drug therapy: Secondary | ICD-10-CM | POA: Diagnosis not present

## 2023-03-24 DIAGNOSIS — C3492 Malignant neoplasm of unspecified part of left bronchus or lung: Secondary | ICD-10-CM | POA: Diagnosis not present

## 2023-03-24 DIAGNOSIS — R918 Other nonspecific abnormal finding of lung field: Secondary | ICD-10-CM | POA: Diagnosis not present

## 2023-03-24 DIAGNOSIS — Z87891 Personal history of nicotine dependence: Secondary | ICD-10-CM | POA: Diagnosis not present

## 2023-03-24 DIAGNOSIS — Z9289 Personal history of other medical treatment: Secondary | ICD-10-CM | POA: Diagnosis not present

## 2023-03-24 DIAGNOSIS — Z01811 Encounter for preprocedural respiratory examination: Secondary | ICD-10-CM | POA: Diagnosis not present

## 2023-03-24 DIAGNOSIS — C7A8 Other malignant neuroendocrine tumors: Secondary | ICD-10-CM | POA: Diagnosis not present

## 2023-03-24 DIAGNOSIS — J439 Emphysema, unspecified: Secondary | ICD-10-CM | POA: Diagnosis not present

## 2023-03-27 ENCOUNTER — Other Ambulatory Visit (HOSPITAL_BASED_OUTPATIENT_CLINIC_OR_DEPARTMENT_OTHER): Payer: Self-pay | Admitting: Thoracic Surgery

## 2023-03-27 DIAGNOSIS — Z79899 Other long term (current) drug therapy: Secondary | ICD-10-CM

## 2023-03-28 ENCOUNTER — Encounter (HOSPITAL_COMMUNITY)
Admission: RE | Admit: 2023-03-28 | Discharge: 2023-03-28 | Disposition: A | Discharge status: Home / Self Care | Payer: HMO | Source: Ambulatory Visit | Attending: Pulmonary Disease

## 2023-03-28 VITALS — Wt 204.6 lb

## 2023-03-28 DIAGNOSIS — C3412 Malignant neoplasm of upper lobe, left bronchus or lung: Secondary | ICD-10-CM

## 2023-03-28 NOTE — Progress Notes (Signed)
 Daily Session Note  Patient Details  Name: Raymond Velez MRN: [id number] Date of Birth: Sep 29, 1955 Referring Provider:   Doristine Devoid Pulmonary Rehab Walk Test from 01/25/2023 in [name] for Heart, Vascular, & Lung Health  Referring Provider [name]       Encounter Date: 03/28/2023  Check In:  Session Check In - 03/28/23 0824       Check-In   Supervising physician immediately available to respond to emergencies [facility] MD immediately available    Physician(s) Edd Fabian, NP    [facility]    Staff Present Raford Pitcher, MS, ACSM-CEP, Exercise Physiologist;Trenell Moxey Erin Sons BS, ACSM-CEP, Exercise Physiologist;Mary Gerre Scull, RN, BSN    Virtual Visit No    Medication changes reported     No    Fall or balance concerns reported    No    Tobacco Cessation No Change    Warm-up and Cool-down Performed as group-led instruction    Resistance Training Performed Yes    VAD Patient? No    PAD/SET Patient? No      Pain Assessment   Currently in Pain? No/denies             Capillary Blood Glucose: No results found for this or any previous visit (from the past 24 hours).   Exercise Prescription Changes - 03/28/23 0900       Response to Exercise   Blood Pressure (Admit) 112/68    Blood Pressure (Exercise) 154/74    Blood Pressure (Exit) 100/66    Heart Rate (Admit) 59 bpm    Heart Rate (Exercise) 107 bpm    Heart Rate (Exit) 92 bpm    Oxygen Saturation (Admit) 99 %    Oxygen Saturation (Exercise) 98 %    Oxygen Saturation (Exit) 96 %    Rating of Perceived Exertion (Exercise) 13    Perceived Dyspnea (Exercise) 0    Duration Continue with 30 min of aerobic exercise without signs/symptoms of physical distress.    Intensity THRR unchanged      Progression   Progression Continue to progress workloads to maintain intensity without signs/symptoms of physical distress.      Resistance Training   Training  Prescription Yes    Weight black bands    Reps 10-15    Time 10 Minutes      Treadmill   MPH 3.8    Grade 3.5    Minutes 15    METs 5.8      Bike   Level 4.5    Watts 63    Minutes 15    METs 7.1             Social History   Tobacco Use  Smoking Status Former   Current packs/day: 0.00   Average packs/day: 1.5 packs/day for 15.1 years (22.7 ttl pk-yrs)   Types: Cigarettes   Start date: 11/22/2002   Quit date: 11/12/2022   Years since quitting: 0.3  Smokeless Tobacco Never    Goals Met:  Proper associated with RPD/PD & O2 Sat Independence with exercise equipment Exercise tolerated well No report of concerns or symptoms today Strength training completed today  Goals Unmet:  Not Applicable  Comments: Service time is from 0813 to 0919.      Dr. Mechele Collin is Medical Director for Pulmonary Rehab at [facility].

## 2023-03-29 NOTE — Progress Notes (Signed)
 Pulmonary Individual Treatment Plan  Patient Details  Name: Raymond Velez MRN: [id number] Date of Birth: 1955/09/08 Referring Provider:   Doristine Devoid Pulmonary Rehab Walk Test from 01/25/2023 in [facility]  Referring Provider Mikel Cella [facility])       Initial Encounter Date:  Flowsheet Row Pulmonary Rehab Walk Test from 01/25/2023 in [facility]  Date 01/25/23       Visit Diagnosis: Malignant neoplasm of upper lobe of left lung (HCC)  Patient's Home Medications on Admission:   Current Outpatient Medications:    atorvastatin (LIPITOR) 20 MG tablet, Take 20 mg by mouth daily., Disp: , Rfl:  No current facility-administered medications for this encounter.  Facility-Administered Medications Ordered in Other Encounters:    0.9 %  sodium chloride infusion, , Intravenous, Continuous, Si Gaul, MD, Stopped at 03/01/23 1417  Past Medical History: Past Medical History:  Diagnosis Date   Arthritis    Hyperlipidemia    RBBB     Tobacco Use: Social History   Tobacco Use  Smoking Status Former   Current packs/day: 0.00   Average packs/day: 1.5 packs/day for 15.1 years (22.7 ttl pk-yrs)   Types: Cigarettes   Start date: 11/22/2002   Quit date: 11/12/2022   Years since quitting: 0.3  Smokeless Tobacco Never    Labs: Review Flowsheet       Latest Ref Rng & Units 01/19/2013 02/20/2014 09/24/2014  Labs for ITP Cardiac and Pulmonary Rehab  Cholestrol 125 - 200 mg/dL 409  811  914   LDL (calc) <130 mg/dL 782  956  213   HDL-C >=08 mg/dL 37  34  29   Trlycerides <150 mg/dL 657  846  962   Hemoglobin A1c - - 5.7  5.8     Capillary Blood Glucose: Lab Results  Component Value Date   GLUCAP 89 11/17/2022     Pulmonary Assessment Scores:  Pulmonary Assessment Scores     Row Name 01/25/23 1310         ADL UCSD   ADL Phase Entry     SOB Score  total 1       CAT Score   CAT Score 3       mMRC Score   mMRC Score 2             UCSD: Self-administered rating of dyspnea associated with activities of daily living (ADLs) 6-point scale (0 = "not at all" to 5 = "maximal or unable to do because of breathlessness")  Scoring Scores range from 0 to 120.  Minimally important difference is 5 units  CAT: CAT can identify the health impairment of COPD patients and is better correlated with disease progression.  CAT has a scoring range of zero to 40. The CAT score is classified into four groups of low (less than 10), medium (10 - 20), high (21-30) and very high (31-40) based on the impact level of disease on health status. A CAT score over 10 suggests significant symptoms.  A worsening CAT score could be explained by an exacerbation, poor medication adherence, poor inhaler technique, or progression of COPD or comorbid conditions.  CAT MCID is 2 points  mMRC: mMRC (Modified Medical Research Council) Dyspnea Scale is used to assess the degree of baseline functional disability in patients of respiratory disease due to dyspnea. No minimal important difference is established. A decrease in score of 1 point or greater  is considered a positive change.   Pulmonary Function Assessment:  Pulmonary Function Assessment - 01/25/23 1410       Breath   Bilateral Breath Sounds Decreased             Exercise Target Goals: Exercise Program Goal: Individual exercise prescription set using results from initial 6 min walk test and THRR while considering  patient's activity barriers and safety.   Exercise Prescription Goal: Initial exercise prescription builds to 30-45 minutes a day of aerobic activity, 2-3 days per week.  Home exercise guidelines will be given to patient during program as part of exercise prescription that the participant will acknowledge.  Activity Barriers & Risk Stratification:  Activity Barriers & Cardiac Risk Stratification  - 01/25/23 1310       Activity Barriers & Cardiac Risk Stratification   Activity Barriers Deconditioning;Shortness of Breath;Muscular Weakness    Cardiac Risk Stratification Moderate             6 Minute Walk:  6 Minute Walk     Row Name 01/25/23 1403         6 Minute Walk   Phase Initial     Distance 1524 feet     Walk Time 6 minutes     # of Rest Breaks 0     MPH 2.89     METS 3.41     RPE 11     Perceived Dyspnea  0     VO2 Peak 11.93     Symptoms No     Resting HR 74 bpm     Resting BP 110/62     Resting Oxygen Saturation  98 %     Exercise Oxygen Saturation  during 6 min walk 94 %     Max Ex. HR 111 bpm     Max Ex. BP 140/60     2 Minute Post BP 110/60       Interval HR   1 Minute HR 89     2 Minute HR --  equipment failure     3 Minute HR 111     4 Minute HR 105     5 Minute HR 100     6 Minute HR 104     2 Minute Post HR 75     Interval Heart Rate? Yes       Interval Oxygen   Interval Oxygen? Yes     Baseline Oxygen Saturation % 98 %     1 Minute Oxygen Saturation % 97 %     1 Minute Liters of Oxygen 0 L     2 Minute Oxygen Saturation % --  equipment failure     2 Minute Liters of Oxygen 0 L     3 Minute Oxygen Saturation % 95 %     3 Minute Liters of Oxygen 0 L     4 Minute Oxygen Saturation % 95 %     4 Minute Liters of Oxygen 0 L     5 Minute Oxygen Saturation % 94 %     5 Minute Liters of Oxygen 0 L     6 Minute Oxygen Saturation % 98 %     6 Minute Liters of Oxygen 0 L     2 Minute Post Oxygen Saturation % 100 %     2 Minute Post Liters of Oxygen 0 L              Oxygen Initial Assessment:  Oxygen Initial  Assessment - 01/25/23 1309       Home Oxygen   Home Oxygen Device None    Sleep Oxygen Prescription None    Home Exercise Oxygen Prescription None    Home Resting Oxygen Prescription None    Compliance with Home Oxygen Use Yes      Initial 6 min Walk   Oxygen Used None      Program Oxygen Prescription   Program  Oxygen Prescription None      Intervention   Short Term Goals To learn and exhibit compliance with exercise, home and travel O2 prescription;To learn and understand importance of maintaining oxygen saturations>88%;To learn and understand importance of monitoring SPO2 with pulse oximeter and demonstrate accurate use of the pulse oximeter.;To learn and demonstrate proper pursed lip breathing techniques or other breathing techniques. ;Other    Long  Term Goals Other;Exhibits proper breathing techniques, such as pursed lip breathing or other method taught during program session;Maintenance of O2 saturations>88%;Verbalizes importance of monitoring SPO2 with pulse oximeter and return demonstration;Exhibits compliance with exercise, home  and travel O2 prescription             Oxygen Re-Evaluation:  Oxygen Re-Evaluation     Row Name 01/25/23 1429 02/20/23 0910 03/22/23 0933         Program Oxygen Prescription   Program Oxygen Prescription None None None       Home Oxygen   Home Oxygen Device None None None     Sleep Oxygen Prescription None None None     Home Exercise Oxygen Prescription None None None     Home Resting Oxygen Prescription None None None     Compliance with Home Oxygen Use Yes Yes Yes       Goals/Expected Outcomes   Short Term Goals To learn and exhibit compliance with exercise, home and travel O2 prescription;To learn and understand importance of maintaining oxygen saturations>88%;To learn and understand importance of monitoring SPO2 with pulse oximeter and demonstrate accurate use of the pulse oximeter.;To learn and demonstrate proper pursed lip breathing techniques or other breathing techniques. ;Other To learn and exhibit compliance with exercise, home and travel O2 prescription;To learn and understand importance of maintaining oxygen saturations>88%;To learn and understand importance of monitoring SPO2 with pulse oximeter and demonstrate accurate use of the pulse  oximeter.;To learn and demonstrate proper pursed lip breathing techniques or other breathing techniques. ;Other To learn and exhibit compliance with exercise, home and travel O2 prescription;To learn and understand importance of maintaining oxygen saturations>88%;To learn and understand importance of monitoring SPO2 with pulse oximeter and demonstrate accurate use of the pulse oximeter.;To learn and demonstrate proper pursed lip breathing techniques or other breathing techniques. ;Other     Long  Term Goals Other;Exhibits proper breathing techniques, such as pursed lip breathing or other method taught during program session;Maintenance of O2 saturations>88%;Verbalizes importance of monitoring SPO2 with pulse oximeter and return demonstration;Exhibits compliance with exercise, home  and travel O2 prescription Other;Exhibits proper breathing techniques, such as pursed lip breathing or other method taught during program session;Maintenance of O2 saturations>88%;Verbalizes importance of monitoring SPO2 with pulse oximeter and return demonstration;Exhibits compliance with exercise, home  and travel O2 prescription Other;Exhibits proper breathing techniques, such as pursed lip breathing or other method taught during program session;Maintenance of O2 saturations>88%;Verbalizes importance of monitoring SPO2 with pulse oximeter and return demonstration;Exhibits compliance with exercise, home  and travel O2 prescription     Goals/Expected Outcomes Compliance and understanding of oxygen saturation monitoring and breathing techniques to  decrease shortness of breath Compliance and understanding of oxygen saturation monitoring and breathing techniques to decrease shortness of breath Compliance and understanding of oxygen saturation monitoring and breathing techniques to decrease shortness of breath              Oxygen Discharge (Final Oxygen Re-Evaluation):  Oxygen Re-Evaluation - 03/22/23 0933       Program Oxygen  Prescription   Program Oxygen Prescription None      Home Oxygen   Home Oxygen Device None    Sleep Oxygen Prescription None    Home Exercise Oxygen Prescription None    Home Resting Oxygen Prescription None    Compliance with Home Oxygen Use Yes      Goals/Expected Outcomes   Short Term Goals To learn and exhibit compliance with exercise, home and travel O2 prescription;To learn and understand importance of maintaining oxygen saturations>88%;To learn and understand importance of monitoring SPO2 with pulse oximeter and demonstrate accurate use of the pulse oximeter.;To learn and demonstrate proper pursed lip breathing techniques or other breathing techniques. ;Other    Long  Term Goals Other;Exhibits proper breathing techniques, such as pursed lip breathing or other method taught during program session;Maintenance of O2 saturations>88%;Verbalizes importance of monitoring SPO2 with pulse oximeter and return demonstration;Exhibits compliance with exercise, home  and travel O2 prescription    Goals/Expected Outcomes Compliance and understanding of oxygen saturation monitoring and breathing techniques to decrease shortness of breath             Initial Exercise Prescription:  Initial Exercise Prescription - 01/25/23 1400       Date of Initial Exercise RX and Referring Provider   Date 01/25/23    Referring Provider [name]    Expected Discharge Date 04/20/23      Treadmill   MPH 2.2    Grade 0    Minutes 15    METs 3      Bike   Level 4    Minutes 15    METs 3      Prescription Details   Frequency (times per week) 2    Duration Progress to 30 minutes of continuous aerobic without signs/symptoms of physical distress      Intensity   THRR 40-80% of Max Heartrate 62-123    Ratings of Perceived Exertion 11-13    Perceived Dyspnea 0-4      Progression   Progression Continue to progress workloads to maintain intensity without signs/symptoms of physical distress.       Resistance Training   Training Prescription Yes    Weight black bands (7.3 lbs)    Reps 10-15             Perform Capillary Blood Glucose checks as needed.  Exercise Prescription Changes:   Exercise Prescription Changes     Row Name 01/31/23 0900 02/14/23 0900 02/21/23 0900 02/28/23 0900 03/09/23 0848     Response to Exercise   Blood Pressure (Admit) 112/62 120/60 -- 100/64 100/60   Blood Pressure (Exercise) 142/78 154/80 -- 178/70 --   Blood Pressure (Exit) 108/70 114/68 -- 102/70 120/64   Heart Rate (Admit) 65 bpm 71 bpm -- 70 bpm 77 bpm   Heart Rate (Exercise) 114 bpm 132 bpm -- 126 bpm 128 bpm   Heart Rate (Exit) 84 bpm 90 bpm -- 86 bpm 94 bpm   Oxygen Saturation (Admit) 98 % 98 % -- 98 % 99 %   Oxygen Saturation (Exercise) 95 % 95 % -- 96 %  97 %   Oxygen Saturation (Exit) 96 % 96 % -- 97 % 96 %   Rating of Perceived Exertion (Exercise) 11 13 -- 13 13   Perceived Dyspnea (Exercise) 0 0 -- 0 0   Duration Continue with 30 min of aerobic exercise without signs/symptoms of physical distress. Continue with 30 min of aerobic exercise without signs/symptoms of physical distress. -- Continue with 30 min of aerobic exercise without signs/symptoms of physical distress. Continue with 30 min of aerobic exercise without signs/symptoms of physical distress.   Intensity THRR unchanged THRR unchanged -- THRR unchanged THRR unchanged     Progression   Progression Continue to progress workloads to maintain intensity without signs/symptoms of physical distress. Continue to progress workloads to maintain intensity without signs/symptoms of physical distress. -- Continue to progress workloads to maintain intensity without signs/symptoms of physical distress. Continue to progress workloads to maintain intensity without signs/symptoms of physical distress.     Resistance Training   Training Prescription Yes Yes -- Yes Yes   Weight black bands black bands -- black bands black bands   Reps  10-15 10-15 -- 10-15 10-15   Time 10 Minutes 10 Minutes -- 10 Minutes 10 Minutes     Treadmill   MPH 2.7 3 -- 3.3 3.2   Grade 0 3 -- 3 3   Minutes 15 15 -- 15 15   METs 2.9 4.4 -- 4.9 4.7     Bike   Level 4 4 -- 1.1 4   Minutes 15 15 -- 15 15   METs 4.4 4 -- 4.3 4.3     Home Exercise Plan   Plans to continue exercise at -- -- Home (comment) -- --   Frequency -- -- Add 1 additional day to program exercise sessions. -- --   Initial Home Exercises Provided -- -- 02/21/23 -- --    Row Name 03/28/23 0900             Response to Exercise   Blood Pressure (Admit) 112/68       Blood Pressure (Exercise) 154/74       Blood Pressure (Exit) 100/66       Heart Rate (Admit) 59 bpm       Heart Rate (Exercise) 107 bpm       Heart Rate (Exit) 92 bpm       Oxygen Saturation (Admit) 99 %       Oxygen Saturation (Exercise) 98 %       Oxygen Saturation (Exit) 96 %       Rating of Perceived Exertion (Exercise) 13       Perceived Dyspnea (Exercise) 0       Duration Continue with 30 min of aerobic exercise without signs/symptoms of physical distress.       Intensity THRR unchanged         Progression   Progression Continue to progress workloads to maintain intensity without signs/symptoms of physical distress.         Resistance Training   Training Prescription Yes       Weight black bands       Reps 10-15       Time 10 Minutes         Treadmill   MPH 3.8       Grade 3.5       Minutes 15       METs 5.8         Bike   Level 4.5  Watts 63       Minutes 15       METs 7.1                Exercise Comments:   Exercise Comments     Row Name 01/31/23 0836 02/21/23 0923         Exercise Comments Raymond Velez completed his first day of exericse. He exercised for 15 min on the upright bike and treadmill. Raymond Velez averaged 4.4 METs at level 4 on the upright bike and 2.7 METs at 2.7 mph on the treadmill. He performed the warmup and cooldown standing without any limitations. Discussed  METs. Completed home exercise plan. Raymond Velez is currently exercising at home. He cycles 1-2 days/wk for 30 min/day. Raymond Velez also mentioned how he does work around [employer]. I explained to Raymond Velez the difference between activities of daily living and exercise. Raymond Velez voiced understanding. I encouraged Raymond Velez to cycle or walk 2 non-rehab days/wk for 30 min/day. He agreed with my recommendations. Raymond Velez seems to understand the importance of exercising outside of rehab.               Exercise Goals and Review:   Exercise Goals     Row Name 01/25/23 1312             Exercise Goals   Increase Physical Activity Yes       Intervention Provide advice, education, support and counseling about physical activity/exercise needs.;Develop an individualized exercise prescription for aerobic and resistive training based on initial evaluation findings, risk stratification, comorbidities and participant's personal goals.       Expected Outcomes Short Term: Attend rehab on a regular basis to increase amount of physical activity.;Long Term: Add in home exercise to make exercise part of routine and to increase amount of physical activity.;Long Term: Exercising regularly at least 3-5 days a week.       Increase Strength and Stamina Yes       Intervention Provide advice, education, support and counseling about physical activity/exercise needs.;Develop an individualized exercise prescription for aerobic and resistive training based on initial evaluation findings, risk stratification, comorbidities and participant's personal goals.       Expected Outcomes Short Term: Increase workloads from initial exercise prescription for resistance, speed, and METs.;Short Term: Perform resistance training exercises routinely during rehab and add in resistance training at home;Long Term: Improve cardiorespiratory fitness, muscular endurance and strength as measured by increased METs and functional capacity ( )       Able to understand and  use rate of perceived exertion (RPE) scale Yes       Intervention Provide education and explanation on how to use RPE scale       Expected Outcomes Short Term: Able to use RPE daily in rehab to express subjective intensity level;Long Term:  Able to use RPE to guide intensity level when exercising independently       Able to understand and use Dyspnea scale Yes       Intervention Provide education and explanation on how to use Dyspnea scale       Expected Outcomes Short Term: Able to use Dyspnea scale daily in rehab to express subjective sense of shortness of breath during exertion;Long Term: Able to use Dyspnea scale to guide intensity level when exercising independently       Knowledge and understanding of Target Heart Rate Range (THRR) Yes       Intervention Provide education and explanation of THRR including how the numbers were predicted and  where they are located for reference       Expected Outcomes Short Term: Able to state/look up THRR;Long Term: Able to use THRR to govern intensity when exercising independently;Short Term: Able to use daily as guideline for intensity in rehab       Understanding of Exercise Prescription Yes       Intervention Provide education, explanation, and written materials on patient's individual exercise prescription       Expected Outcomes Short Term: Able to explain program exercise prescription;Long Term: Able to explain home exercise prescription to exercise independently                Exercise Goals Re-Evaluation :  Exercise Goals Re-Evaluation     Row Name 01/25/23 1428 02/20/23 0907 03/22/23 0930         Exercise Goal Re-Evaluation   Exercise Goals Review Increase Physical Activity;Able to understand and use Dyspnea scale;Understanding of Exercise Prescription;Increase Strength and Stamina;Knowledge and understanding of Target Heart Rate Range (THRR);Able to understand and use rate of perceived exertion (RPE) scale Increase Physical Activity;Able to  understand and use Dyspnea scale;Understanding of Exercise Prescription;Increase Strength and Stamina;Knowledge and understanding of Target Heart Rate Range (THRR);Able to understand and use rate of perceived exertion (RPE) scale Increase Physical Activity;Able to understand and use Dyspnea scale;Understanding of Exercise Prescription;Increase Strength and Stamina;Knowledge and understanding of Target Heart Rate Range (THRR);Able to understand and use rate of perceived exertion (RPE) scale     Comments Pt to begin exercise 1/21. Will monitor for progression. Raymond Velez has completed 5 exercise sessions. He exercises for 15 min on the upright bike and treadmill. Raymond Velez averages 4.3 METs at level 4 on the upright bike and 4.7 METs at 3.2 mph and 3% incline on the treadmill. He has increased his workload for both exercise modes as he tolerates progressions well. We have not discussed home exercise yet, but I plan to talk to him soon. Will continue to monitor and progress as able. Raymond Velez has completed 11 exercise sessions. He exercises for 15 min on the upright bike and treadmill. Raymond Velez averages 4.3 METs at level 4.5 on the upright bike and 5.4 METs at 3.6 mph and 3.5% incline on the treadmill. Raymond Velez has increased his speed and incline on the treadmill and level on the upright bike. His METs have increased for both exercise modes. We have discussed home exercise as Dagon exercises outside of rehab. Will continue to monitor and progress as able.     Expected Outcomes Through exercise at rehab and at home, the patient will decrease shortness of breath with daily activities and feel confident in carrying out an exercise regime at home. Through exercise at rehab and at home, the patient will decrease shortness of breath with daily activities and feel confident in carrying out an exercise regime at home. Through exercise at rehab and at home, the patient will decrease shortness of breath with daily activities and feel confident  in carrying out an exercise regime at home.              Discharge Exercise Prescription (Final Exercise Prescription Changes):  Exercise Prescription Changes - 03/28/23 0900       Response to Exercise   Blood Pressure (Admit) 112/68    Blood Pressure (Exercise) 154/74    Blood Pressure (Exit) 100/66    Heart Rate (Admit) 59 bpm    Heart Rate (Exercise) 107 bpm    Heart Rate (Exit) 92 bpm    Oxygen Saturation (Admit)  99 %    Oxygen Saturation (Exercise) 98 %    Oxygen Saturation (Exit) 96 %    Rating of Perceived Exertion (Exercise) 13    Perceived Dyspnea (Exercise) 0    Duration Continue with 30 min of aerobic exercise without signs/symptoms of physical distress.    Intensity THRR unchanged      Progression   Progression Continue to progress workloads to maintain intensity without signs/symptoms of physical distress.      Resistance Training   Training Prescription Yes    Weight black bands    Reps 10-15    Time 10 Minutes      Treadmill   MPH 3.8    Grade 3.5    Minutes 15    METs 5.8      Bike   Level 4.5    Watts 63    Minutes 15    METs 7.1             Nutrition:  Target Goals: Understanding of nutrition guidelines, daily intake of sodium 1500mg , cholesterol 200mg , calories 30% from fat and 7% or less from saturated fats, daily to have 5 or more servings of fruits and vegetables.  Biometrics:    Nutrition Therapy Plan and Nutrition Goals:  Nutrition Therapy & Goals - 03/20/23 1050       Nutrition Therapy   Diet General Healthy Diet    Drug/Food Interactions Statins/Certain Fruits      Personal Nutrition Goals   Nutrition Goal Patient to improve diet quality by using the plate method as a guide for meal planning to include lean protein/plant protein, fruits, vegetables, whole grains, nonfat dairy as part of a well-balanced diet.    Comments Raymond Velez has medical history of lung cancer, hyerlipidemia, tobacco use. He did stop smoking in  November 2024. He has completed chemotherapy, immunotherapy and is scheduled for surgery 04/19/2023. He is down 2.4# since starting with our program. Patient will continue benefit from participation in pulmonary rehab for nutrition, exercise, and lifestyle modification support.      Intervention Plan   Intervention Prescribe, educate and counsel regarding individualized specific dietary modifications aiming towards targeted core components such as weight, hypertension, lipid management, diabetes, heart failure and other comorbidities.;Nutrition handout(s) given to patient.    Expected Outcomes Short Term Goal: Understand basic principles of dietary content, such as calories, fat, sodium, cholesterol and nutrients.;Long Term Goal: Adherence to prescribed nutrition plan.             Nutrition Assessments:  MEDIFICTS Score Key: >=70 Need to make dietary changes  40-70 Heart Healthy Diet <= 40 Therapeutic Level Cholesterol Diet   Picture Your Plate Scores: <63 Unhealthy dietary pattern with much room for improvement. 41-50 Dietary pattern unlikely to meet recommendations for good health and room for improvement. 51-60 More healthful dietary pattern, with some room for improvement.  >60 Healthy dietary pattern, although there may be some specific behaviors that could be improved.    Nutrition Goals Re-Evaluation:  Nutrition Goals Re-Evaluation     Row Name 03/20/23 1050             Goals   Current Weight 204 lb 9.4 oz (92.8 kg)  weight from last attended session on 03/16/23       Comment oncology continues to monitor CBC, CMP       Expected Outcome Raymond Velez has medical history of lung cancer, hyerlipidemia, tobacco use. He did stop smoking in November 2024. He has completed chemotherapy, immunotherapy and  is scheduled for surgery 04/19/2023. He is down 2.4# since starting with our program. Patient will continue benefit from participation in pulmonary rehab for nutrition, exercise, and  lifestyle modification support.                Nutrition Goals Discharge (Final Nutrition Goals Re-Evaluation):  Nutrition Goals Re-Evaluation - 03/20/23 1050       Goals   Current Weight 204 lb 9.4 oz (92.8 kg)   weight from last attended session on 03/16/23   Comment oncology continues to monitor CBC, CMP    Expected Outcome Raymond Velez has medical history of lung cancer, hyerlipidemia, tobacco use. He did stop smoking in November 2024. He has completed chemotherapy, immunotherapy and is scheduled for surgery 04/19/2023. He is down 2.4# since starting with our program. Patient will continue benefit from participation in pulmonary rehab for nutrition, exercise, and lifestyle modification support.             Psychosocial: Target Goals: Acknowledge presence or absence of significant depression and/or stress, maximize coping skills, provide positive support system. Participant is able to verbalize types and ability to use techniques and skills needed for reducing stress and depression.  Initial Review & Psychosocial Screening:  Initial Psych Review & Screening - 01/25/23 1303       Initial Review   Current issues with None Identified      Family Dynamics   Good Support System? Yes    Comments Candida Peeling, wife. Daughter [name], son      Barriers   Psychosocial barriers to participate in program The patient should benefit from training in stress management and relaxation.      Screening Interventions   Interventions Encouraged to exercise    Expected Outcomes Short Term goal: Utilizing psychosocial counselor, staff and physician to assist with identification of specific Stressors or current issues interfering with healing process. Setting desired goal for each stressor or current issue identified.;Long Term Goal: Stressors or current issues are controlled or eliminated.;Short Term goal: Identification and review with participant of any Quality of Life or Depression concerns found by scoring  the questionnaire.             Quality of Life Scores:  Scores of 19 and below usually indicate a poorer quality of life in these areas.  A difference of  2-3 points is a clinically meaningful difference.  A difference of 2-3 points in the total score of the Quality of Life Index has been associated with significant improvement in overall quality of life, self-image, physical symptoms, and general health in studies assessing change in quality of life.  PHQ-9: Review Flowsheet       01/25/2023 09/24/2014  Depression screen PHQ 2/9  Decreased Interest 0 0  Down, Depressed, Hopeless 0 0  PHQ - 2 Score 0 0  Altered sleeping 0 -  Tired, decreased energy 0 -  Change in appetite 0 -  Feeling bad or failure about yourself  0 -  Trouble concentrating 0 -  Moving slowly or fidgety/restless 0 -  Suicidal thoughts 0 -  PHQ-9 Score 0 -  Difficult doing work/chores Not difficult at all -   Interpretation of Total Score  Total Score Depression Severity:  1-4 = Minimal depression, 5-9 = Mild depression, 10-14 = Moderate depression, 15-19 = Moderately severe depression, 20-27 = Severe depression   Psychosocial Evaluation and Intervention:  Psychosocial Evaluation - 01/25/23 1305       Psychosocial Evaluation & Interventions   Interventions Encouraged to  exercise with the program and follow exercise prescription    Expected Outcomes For pt to participate in program    Continue Psychosocial Services  Follow up required by staff             Psychosocial Re-Evaluation:  Psychosocial Re-Evaluation     Row Name 01/27/23 1525 02/22/23 1433 03/20/23 1404         Psychosocial Re-Evaluation   Current issues with None Identified None Identified None Identified     Comments No changes since orientation. Raymond Velez is scheduled to start the program on 02/02/23. At monthly psy/soc re-eval, Raymond Velez continues to deny psy/soc concerns. He has great support from his wife and children. Raymond Velez still  denies any psychosocial barriers or concerns at this time.     Expected Outcomes For Raymond Velez to participate in PR free or barriers or concerns For Jahson to continue to participate in PR free or barriers or concerns For Raymond Velez to continue to participate in PR free or barriers or concerns     Interventions Encouraged to attend Pulmonary Rehabilitation for the exercise Encouraged to attend Pulmonary Rehabilitation for the exercise Encouraged to attend Pulmonary Rehabilitation for the exercise     Continue Psychosocial Services  No Follow up required No Follow up required No Follow up required              Psychosocial Discharge (Final Psychosocial Re-Evaluation):  Psychosocial Re-Evaluation - 03/20/23 1404       Psychosocial Re-Evaluation   Current issues with None Identified    Comments Raymond Velez still denies any psychosocial barriers or concerns at this time.    Expected Outcomes For Raymond Velez to continue to participate in PR free or barriers or concerns    Interventions Encouraged to attend Pulmonary Rehabilitation for the exercise    Continue Psychosocial Services  No Follow up required             Education: Education Goals: Education classes will be provided on a weekly basis, covering required topics. Participant will state understanding/return demonstration of topics presented.  Learning Barriers/Preferences:  Learning Barriers/Preferences - 01/25/23 1306       Learning Barriers/Preferences   Learning Barriers None    Learning Preferences Audio             Education Topics: Know Your Numbers Group instruction that is supported by a PowerPoint presentation. Instructor discusses importance of knowing and understanding resting, exercise, and post-exercise oxygen saturation, heart rate, and blood pressure. Oxygen saturation, heart rate, blood pressure, rating of perceived exertion, and dyspnea are reviewed along with a normal range for these values.    Exercise for the  Pulmonary Patient Group instruction that is supported by a PowerPoint presentation. Instructor discusses benefits of exercise, core components of exercise, frequency, duration, and intensity of an exercise routine, importance of utilizing pulse oximetry during exercise, safety while exercising, and options of places to exercise outside of rehab.    MET Level  Group instruction provided by PowerPoint, verbal discussion, and written material to support subject matter. Instructor reviews what METs are and how to increase METs.    Pulmonary Medications Verbally interactive group education provided by instructor with focus on inhaled medications and proper administration.   Anatomy and Physiology of the Respiratory System Group instruction provided by PowerPoint, verbal discussion, and written material to support subject matter. Instructor reviews respiratory cycle and anatomical components of the respiratory system and their functions. Instructor also reviews differences in obstructive and restrictive respiratory diseases with examples of  each.  Flowsheet Row PULMONARY REHAB OTHER RESPIRATORY from 03/16/2023 in [facility] for [facility], Vascular, & Lung Health  Date 03/16/23  Educator RT  Instruction Review Code 1- Verbalizes Understanding       Oxygen Safety Group instruction provided by PowerPoint, verbal discussion, and written material to support subject matter. There is an overview of "What is Oxygen" and "Why do we need it".  Instructor also reviews how to create a safe environment for oxygen use, the importance of using oxygen as prescribed, and the risks of noncompliance. There is a brief discussion on traveling with oxygen and resources the patient may utilize.   Oxygen Use Group instruction provided by PowerPoint, verbal discussion, and written material to discuss how supplemental oxygen is prescribed and different types of oxygen supply systems. Resources for more  information are provided.    Breathing Techniques Group instruction that is supported by demonstration and informational handouts. Instructor discusses the benefits of pursed lip and diaphragmatic breathing and detailed demonstration on how to perform both.  Flowsheet Row PULMONARY REHAB OTHER RESPIRATORY from 02/02/2023 in [facility] for Heart, Vascular, & Lung Health  Date 02/02/23  Educator RN  Instruction Review Code 1- Verbalizes Understanding        Risk Factor Reduction Group instruction that is supported by a PowerPoint presentation. Instructor discusses the definition of a risk factor, different risk factors for pulmonary disease, and how the heart and lungs work together. Flowsheet Row PULMONARY REHAB OTHER RESPIRATORY from 02/23/2023 in [facility] for Heart, Vascular, & Lung Health  Date 02/23/23  Educator EP  Instruction Review Code 1- Verbalizes Understanding       Pulmonary Diseases Group instruction provided by PowerPoint, verbal discussion, and written material to support subject matter. Instructor gives an overview of the different type of pulmonary diseases. There is also a discussion on risk factors and symptoms as well as ways to manage the diseases. Flowsheet Row PULMONARY REHAB OTHER RESPIRATORY from 03/09/2023 in [facility] for Heart, Vascular, & Lung Health  Date 03/09/23  Educator RT  Instruction Review Code 1- Verbalizes Understanding       Stress and Energy Conservation Group instruction provided by PowerPoint, verbal discussion, and written material to support subject matter. Instructor gives an overview of stress and the impact it can have on the body. Instructor also reviews ways to reduce stress. There is also a discussion on energy conservation and ways to conserve energy throughout the day.   Warning Signs and Symptoms Group instruction provided by PowerPoint, verbal  discussion, and written material to support subject matter. Instructor reviews warning signs and symptoms of stroke, heart attack, cold and flu. Instructor also reviews ways to prevent the spread of infection. Flowsheet Row PULMONARY REHAB OTHER RESPIRATORY from 02/16/2023 in [facility] for Heart, Vascular, & Lung Health  Date 02/16/23  Educator RN  Instruction Review Code 1- Verbalizes Understanding       Other Education Group or individual verbal, written, or video instructions that support the educational goals of the pulmonary rehab program.    Knowledge Questionnaire Score:  Knowledge Questionnaire Score - 01/25/23 1327       Knowledge Questionnaire Score   Pre Score 11/18             Core Components/Risk Factors/Patient Goals at Admission:  Personal Goals and Risk Factors at Admission - 01/25/23 1307       Core Components/Risk  Factors/Patient Goals on Admission   Improve shortness of breath with ADL's Yes    Intervention Provide education, individualized exercise plan and daily activity instruction to help decrease symptoms of SOB with activities of daily living.    Expected Outcomes Short Term: Improve cardiorespiratory fitness to achieve a reduction of symptoms when performing ADLs;Long Term: Be able to perform more ADLs without symptoms or delay the onset of symptoms             Core Components/Risk Factors/Patient Goals Review:   Goals and Risk Factor Review     Row Name 01/30/23 0929 02/22/23 1435 03/20/23 1405         Core Components/Risk Factors/Patient Goals Review   Personal Goals Review Improve shortness of breath with ADL's;Develop more efficient breathing techniques such as purse lipped breathing and diaphragmatic breathing and practicing self-pacing with activity. Improve shortness of breath with ADL's;Develop more efficient breathing techniques such as purse lipped breathing and diaphragmatic breathing and practicing  self-pacing with activity. Improve shortness of breath with ADL's     Review Unable to asses goals, Jabron is scheduled to start the program on 01/31/23 Monthly review of patient's Core Components/Risk Factors/Patient Goals are as follows: Goal progressing on improving Denman's shortness of breath with ADLs. Rameses has been able to increase his METS and workload on the treadmill and upright bike. He can correctly voice his dyspnea and exertion scale scores to staff. Kento is progressing on developing more efficient breathing techniques such as purse lipped breathing and diaphragmatic breathing; and practicing self-pacing with activity. Zakye can demonstrate purse lipped breathing and can self-pace but needs assistance with diaphragmatic breathing. We are working on this with him. Other will continue to benefit from participation in PR for nutrition, education, exercise, and lifestyle modification. Monthly review of patient's Core Components/Risk Factors/Patient Goals are as follows: Goal progressing on improving Chaka's shortness of breath with ADLs. Dragan has been able to increase his METS and workload on the treadmill and upright bike. He is currently exercising on RA to keep sats >88%.  He can correctly voice his dyspnea and exertion scale scores to staff. Goal met on developing more efficient breathing techniques such as purse lipped breathing and diaphragmatic breathing; and practicing self-pacing with activity. Italo uses purse lip breathing when he gets short of breath and can self-pace. He has been practicing diaphragmatic breathing at home and feels he is getting the hang of it. Jozef will continue to benefit from participation in PR for nutrition, education, exercise, and lifestyle modification.     Expected Outcomes For Murel to improve shortness of breath with ADL's and develop more efficient breathing techniques such as purse lipped breathing and diaphragmatic breathing; and practicing self-pacing with  activity and improve shortness of breath with ADL's For Cote to improve shortness of breath with ADL's and develop more efficient breathing techniques such as purse lipped breathing and diaphragmatic breathing; and practicing self-pacing with activity and improve shortness of breath with ADL's For Dov to improve shortness of breath with ADL's              Core Components/Risk Factors/Patient Goals at Discharge (Final Review):   Goals and Risk Factor Review - 03/20/23 1405       Core Components/Risk Factors/Patient Goals Review   Personal Goals Review Improve shortness of breath with ADL's    Review Monthly review of patient's Core Components/Risk Factors/Patient Goals are as follows: Goal progressing on improving Tagen's shortness of breath with ADLs. Chandlar has been  able to increase his METS and workload on the treadmill and upright bike. He is currently exercising on RA to keep sats >88%.  He can correctly voice his dyspnea and exertion scale scores to staff. Goal met on developing more efficient breathing techniques such as purse lipped breathing and diaphragmatic breathing; and practicing self-pacing with activity. Demorio uses purse lip breathing when he gets short of breath and can self-pace. He has been practicing diaphragmatic breathing at home and feels he is getting the hang of it. Izel will continue to benefit from participation in PR for nutrition, education, exercise, and lifestyle modification.    Expected Outcomes For Adis to improve shortness of breath with ADL's             ITP Comments: Pt is making expected progress toward Pulmonary Rehab goals after completing 13 session(s). Recommend continued exercise, life style modification, education, and utilization of breathing techniques to increase stamina and strength, while also decreasing shortness of breath with exertion.      Comments: Dr. Mechele Collin is Medical Director for Pulmonary Rehab at [facility].

## 2023-03-30 ENCOUNTER — Encounter (HOSPITAL_COMMUNITY): Payer: HMO

## 2023-04-04 ENCOUNTER — Encounter (HOSPITAL_COMMUNITY)
Admission: RE | Admit: 2023-04-04 | Discharge: 2023-04-04 | Disposition: A | Discharge status: Home / Self Care | Payer: HMO | Source: Ambulatory Visit | Attending: Pulmonary Disease

## 2023-04-04 DIAGNOSIS — C3412 Malignant neoplasm of upper lobe, left bronchus or lung: Secondary | ICD-10-CM | POA: Diagnosis not present

## 2023-04-04 NOTE — Progress Notes (Signed)
 Daily Session Note  Patient Details  Name: Raymond Velez MRN: [id number] Date of Birth: February 01, 1955 Referring Provider:   Doristine Devoid Pulmonary Rehab Walk Test from 01/25/2023 in [facility] for Heart, Vascular, & Lung Health  Referring Provider [name]       Encounter Date: 04/04/2023  Check In:  Session Check In - 04/04/23 0820       Check-In   Supervising physician immediately available to respond to emergencies [facility] MD immediately available    Physician(s) Hoover Browns, NP    Location [facility]    Staff Present Raford Pitcher, MS, ACSM-CEP, Exercise Physiologist;Donnah Levert Erin Sons BS, ACSM-CEP, Exercise Physiologist;Mary Gerre Scull, RN, BSN    Virtual Visit No    Medication changes reported     No    Fall or balance concerns reported    No    Tobacco Cessation No Change    Warm-up and Cool-down Performed as group-led instruction    Resistance Training Performed Yes    VAD Patient? No    PAD/SET Patient? No      Pain Assessment   Currently in Pain? No/denies    Multiple Pain Sites No             Capillary Blood Glucose: No results found for this or any previous visit (from the past 24 hours).    Social History   Tobacco Use  Smoking Status Former   Current packs/day: 0.00   Average packs/day: 1.5 packs/day for 15.1 years (22.7 ttl pk-yrs)   Types: Cigarettes   Start date: 11/22/2002   Quit date: 11/12/2022   Years since quitting: 0.3  Smokeless Tobacco Never    Goals Met:  Proper associated with RPD/PD & O2 Sat Independence with exercise equipment Exercise tolerated well No report of concerns or symptoms today Strength training completed today  Goals Unmet:  Not Applicable  Comments: Service time is from 0805 to 0910.    Dr. Mechele Collin is Medical Director for Pulmonary Rehab at [facility].

## 2023-04-06 ENCOUNTER — Encounter (HOSPITAL_COMMUNITY)
Admission: RE | Admit: 2023-04-06 | Discharge: 2023-04-06 | Disposition: A | Discharge status: Home / Self Care | Payer: HMO | Source: Ambulatory Visit | Attending: Pulmonary Disease | Admitting: Pulmonary Disease

## 2023-04-06 VITALS — Wt 206.4 lb

## 2023-04-06 DIAGNOSIS — C3412 Malignant neoplasm of upper lobe, left bronchus or lung: Secondary | ICD-10-CM | POA: Diagnosis not present

## 2023-04-06 NOTE — Progress Notes (Signed)
 Daily Session Note  Patient Details  Name: Raymond Velez MRN: [id number] Date of Birth: 1955/05/17 Referring Provider:   Doristine Devoid Pulmonary Rehab Walk Test from 01/25/2023 in [facility] for Heart, Vascular, & Lung Health  Referring Provider [name]       Encounter Date: 04/06/2023  Check In:  Session Check In - 04/06/23 0272       Check-In   Supervising physician immediately available to respond to emergencies [facility] MD immediately available    Physician(s) Jari Favre, PA    Location [facility]    Staff Present Raford Pitcher, MS, ACSM-CEP, Exercise Physiologist;Dainelle Hun Erin Sons BS, ACSM-CEP, Exercise Physiologist;Mary Gerre Scull, RN, BSN;Samantha Belarus, RD, LDN    Virtual Visit No    Medication changes reported     No    Fall or balance concerns reported    No    Tobacco Cessation No Change    Warm-up and Cool-down Performed as group-led instruction    Resistance Training Performed Yes    VAD Patient? No    PAD/SET Patient? No      Pain Assessment   Currently in Pain? No/denies             Capillary Blood Glucose: No results found for this or any previous visit (from the past 24 hours).    Social History   Tobacco Use  Smoking Status Former   Current packs/day: 0.00   Average packs/day: 1.5 packs/day for 15.1 years (22.7 ttl pk-yrs)   Types: Cigarettes   Start date: 11/22/2002   Quit date: 11/12/2022   Years since quitting: 0.3  Smokeless Tobacco Never    Goals Met:  Proper associated with RPD/PD & O2 Sat Independence with exercise equipment Exercise tolerated well No report of concerns or symptoms today Strength training completed today  Goals Unmet:  Not Applicable  Comments: Service time is from 0815 to [phone].    Dr. Mechele Collin is Medical Director for Pulmonary Rehab at [facility].

## 2023-04-07 ENCOUNTER — Ambulatory Visit (HOSPITAL_BASED_OUTPATIENT_CLINIC_OR_DEPARTMENT_OTHER)
Admission: RE | Admit: 2023-04-07 | Discharge: 2023-04-07 | Disposition: A | Discharge status: Home / Self Care | Source: Ambulatory Visit | Attending: Thoracic Surgery | Admitting: Thoracic Surgery

## 2023-04-07 DIAGNOSIS — Z7969 Long term (current) use of other immunomodulators and immunosuppressants: Secondary | ICD-10-CM

## 2023-04-07 DIAGNOSIS — Z79899 Other long term (current) drug therapy: Secondary | ICD-10-CM | POA: Insufficient documentation

## 2023-04-07 DIAGNOSIS — Z01818 Encounter for other preprocedural examination: Secondary | ICD-10-CM | POA: Diagnosis not present

## 2023-04-11 ENCOUNTER — Telehealth (HOSPITAL_COMMUNITY): Payer: Self-pay | Admitting: *Deleted

## 2023-04-11 ENCOUNTER — Encounter (HOSPITAL_COMMUNITY): Admission: RE | Admit: 2023-04-11 | Payer: HMO | Source: Ambulatory Visit

## 2023-04-11 NOTE — Telephone Encounter (Signed)
 Pt LVM stating he will not be able to attend PR today. Will cancel appt.  Ethelda Chick BS, ACSM-CEP 04/11/2023 7:43 AM

## 2023-04-12 ENCOUNTER — Telehealth (HOSPITAL_COMMUNITY): Payer: Self-pay

## 2023-04-12 NOTE — Telephone Encounter (Signed)
 Called pt to let him know that PR class will begin at 8:30 on 04/13/23. He stated he had too much to do and would not be able to make it to class. He will return to class on 04/18/23.

## 2023-04-13 ENCOUNTER — Encounter (HOSPITAL_COMMUNITY): Payer: HMO

## 2023-04-18 ENCOUNTER — Encounter (HOSPITAL_COMMUNITY)
Admission: RE | Admit: 2023-04-18 | Discharge: 2023-04-18 | Disposition: A | Discharge status: Home / Self Care | Payer: HMO | Source: Ambulatory Visit | Attending: Pulmonary Disease | Admitting: Pulmonary Disease

## 2023-04-18 DIAGNOSIS — C3412 Malignant neoplasm of upper lobe, left bronchus or lung: Secondary | ICD-10-CM | POA: Diagnosis not present

## 2023-04-18 NOTE — Progress Notes (Signed)
 Daily Session Note  Patient Details  Name: Raymond Velez MRN: [id number] Date of Birth: 04/27/1955 Referring Provider:   Doristine Devoid Pulmonary Rehab Walk Test from 01/25/2023 in [facility] for Heart, Vascular, & Lung Health  Referring Provider [name]       Encounter Date: 04/18/2023  Check In:  Session Check In - 04/18/23 0825       Check-In   Supervising physician immediately available to respond to emergencies [facility] MD immediately available    Physician(s) Rise Paganini, NP    Location [facility]    Staff Present Raford Pitcher, MS, ACSM-CEP, Exercise Physiologist;Anastacia Reinecke Erin Sons BS, ACSM-CEP, Exercise Physiologist;Mary Gerre Scull, RN, BSN    Virtual Visit No    Medication changes reported     No    Fall or balance concerns reported    No    Tobacco Cessation No Change    Warm-up and Cool-down Performed as group-led instruction    Resistance Training Performed Yes    VAD Patient? No    PAD/SET Patient? No      Pain Assessment   Currently in Pain? No/denies    Multiple Pain Sites No             Capillary Blood Glucose: No results found for this or any previous visit (from the past 24 hours).    Social History   Tobacco Use  Smoking Status Former   Current packs/day: 0.00   Average packs/day: 1.5 packs/day for 15.1 years (22.7 ttl pk-yrs)   Types: Cigarettes   Start date: 11/22/2002   Quit date: 11/12/2022   Years since quitting: 0.4  Smokeless Tobacco Never    Goals Met:  Proper associated with RPD/PD & O2 Sat Independence with exercise equipment Exercise tolerated well No report of concerns or symptoms today Strength training completed today  Goals Unmet:  Not Applicable  Comments: Service time is from 0807 to 0925.    Dr. Mechele Collin is Medical Director for Pulmonary Rehab at [facility].

## 2023-04-19 DIAGNOSIS — R1312 Dysphagia, oropharyngeal phase: Secondary | ICD-10-CM | POA: Diagnosis not present

## 2023-04-19 DIAGNOSIS — R1310 Dysphagia, unspecified: Secondary | ICD-10-CM | POA: Diagnosis not present

## 2023-04-19 DIAGNOSIS — Z4682 Encounter for fitting and adjustment of non-vascular catheter: Secondary | ICD-10-CM | POA: Diagnosis not present

## 2023-04-19 DIAGNOSIS — C3492 Malignant neoplasm of unspecified part of left bronchus or lung: Secondary | ICD-10-CM | POA: Diagnosis not present

## 2023-04-19 DIAGNOSIS — J9811 Atelectasis: Secondary | ICD-10-CM | POA: Diagnosis not present

## 2023-04-19 DIAGNOSIS — Z79899 Other long term (current) drug therapy: Secondary | ICD-10-CM | POA: Diagnosis not present

## 2023-04-19 DIAGNOSIS — J948 Other specified pleural conditions: Secondary | ICD-10-CM | POA: Diagnosis not present

## 2023-04-19 DIAGNOSIS — J939 Pneumothorax, unspecified: Secondary | ICD-10-CM | POA: Diagnosis not present

## 2023-04-19 DIAGNOSIS — C3412 Malignant neoplasm of upper lobe, left bronchus or lung: Secondary | ICD-10-CM | POA: Diagnosis not present

## 2023-04-19 DIAGNOSIS — J941 Fibrothorax: Secondary | ICD-10-CM | POA: Diagnosis not present

## 2023-04-19 DIAGNOSIS — R001 Bradycardia, unspecified: Secondary | ICD-10-CM | POA: Diagnosis not present

## 2023-04-19 DIAGNOSIS — Z5332 Thoracoscopic surgical procedure converted to open procedure: Secondary | ICD-10-CM | POA: Diagnosis not present

## 2023-04-19 DIAGNOSIS — Z87891 Personal history of nicotine dependence: Secondary | ICD-10-CM | POA: Diagnosis not present

## 2023-04-19 DIAGNOSIS — D649 Anemia, unspecified: Secondary | ICD-10-CM | POA: Diagnosis not present

## 2023-04-19 DIAGNOSIS — J8410 Pulmonary fibrosis, unspecified: Secondary | ICD-10-CM | POA: Diagnosis not present

## 2023-04-19 DIAGNOSIS — G8912 Acute post-thoracotomy pain: Secondary | ICD-10-CM | POA: Diagnosis not present

## 2023-04-19 DIAGNOSIS — E785 Hyperlipidemia, unspecified: Secondary | ICD-10-CM | POA: Diagnosis not present

## 2023-04-19 DIAGNOSIS — I10 Essential (primary) hypertension: Secondary | ICD-10-CM | POA: Diagnosis not present

## 2023-04-19 DIAGNOSIS — G8918 Other acute postprocedural pain: Secondary | ICD-10-CM | POA: Diagnosis not present

## 2023-04-19 DIAGNOSIS — Z452 Encounter for adjustment and management of vascular access device: Secondary | ICD-10-CM | POA: Diagnosis not present

## 2023-04-19 DIAGNOSIS — R1314 Dysphagia, pharyngoesophageal phase: Secondary | ICD-10-CM | POA: Diagnosis not present

## 2023-04-19 DIAGNOSIS — J219 Acute bronchiolitis, unspecified: Secondary | ICD-10-CM | POA: Diagnosis not present

## 2023-04-19 DIAGNOSIS — C3490 Malignant neoplasm of unspecified part of unspecified bronchus or lung: Secondary | ICD-10-CM | POA: Diagnosis not present

## 2023-04-19 DIAGNOSIS — Z9221 Personal history of antineoplastic chemotherapy: Secondary | ICD-10-CM | POA: Diagnosis not present

## 2023-04-19 DIAGNOSIS — J449 Chronic obstructive pulmonary disease, unspecified: Secondary | ICD-10-CM | POA: Diagnosis not present

## 2023-04-19 NOTE — Progress Notes (Signed)
 Discharge Progress Report  Patient Details  Name: Raymond Velez MRN: [id number] Date of Birth: Mar 10, 1955 Referring Provider:   Doristine Devoid Pulmonary Rehab Walk Test from 01/25/2023 in [facility]  Referring Provider Mikel Cella [facility])        Number of Visits: 16  Reason for Discharge:  Patient has met program and personal goals.  Smoking History:  Social History   Tobacco Use  Smoking Status Former   Current packs/day: 0.00   Average packs/day: 1.5 packs/day for 15.1 years (22.7 ttl pk-yrs)   Types: Cigarettes   Start date: 11/22/2002   Quit date: 11/12/2022   Years since quitting: 0.4  Smokeless Tobacco Never    Diagnosis:  Malignant neoplasm of upper lobe of left lung [facility])  ADL UCSD:  Pulmonary Assessment Scores     Row Name 01/25/23 1310 04/18/23 0848       ADL UCSD   ADL Phase Entry Exit    SOB Score total 1 4      CAT Score   CAT Score 3 7      mMRC Score   mMRC Score 2 1             Initial Exercise Prescription:  Initial Exercise Prescription - 01/25/23 1400       Date of Initial Exercise RX and Referring Provider   Date 01/25/23    Referring Provider [name]    Expected Discharge Date 04/20/23      Treadmill   MPH 2.2    Grade 0    Minutes 15    METs 3      Bike   Level 4    Minutes 15    METs 3      Prescription Details   Frequency (times per week) 2    Duration Progress to 30 minutes of continuous aerobic without signs/symptoms of physical distress      Intensity   THRR 40-80% of Max Heartrate 62-123    Ratings of Perceived Exertion 11-13    Perceived Dyspnea 0-4      Progression   Progression Continue to progress workloads to maintain intensity without signs/symptoms of physical distress.      Resistance Training   Training Prescription Yes    Weight black bands (7.3 lbs)    Reps 10-15             Discharge Exercise Prescription (Final  Exercise Prescription Changes):  Exercise Prescription Changes - 04/11/23 0900       Resistance Training   Training Prescription Yes    Weight black bands    Reps 10-15    Time 10 Minutes      Treadmill   MPH 4    Grade 3.5    Minutes 15    METs 6.3      Bike   Level 4    Minutes 15    METs 4.4             Functional Capacity:  6 Minute Walk     Row Name 01/25/23 1403         6 Minute Walk   Phase Initial     Distance 1524 feet     Walk Time 6 minutes     # of Rest Breaks 0     MPH 2.89     METS 3.41     RPE 11     Perceived Dyspnea  0  VO2 Peak 11.93     Symptoms No     Resting HR 74 bpm     Resting BP 110/62     Resting Oxygen Saturation  98 %     Exercise Oxygen Saturation  during 6 min walk 94 %     Max Ex. HR 111 bpm     Max Ex. BP 140/60     2 Minute Post BP 110/60       Interval HR   1 Minute HR 89     2 Minute HR --  equipment failure     3 Minute HR 111     4 Minute HR 105     5 Minute HR 100     6 Minute HR 104     2 Minute Post HR 75     Interval Heart Rate? Yes       Interval Oxygen   Interval Oxygen? Yes     Baseline Oxygen Saturation % 98 %     1 Minute Oxygen Saturation % 97 %     1 Minute Liters of Oxygen 0 L     2 Minute Oxygen Saturation % --  equipment failure     2 Minute Liters of Oxygen 0 L     3 Minute Oxygen Saturation % 95 %     3 Minute Liters of Oxygen 0 L     4 Minute Oxygen Saturation % 95 %     4 Minute Liters of Oxygen 0 L     5 Minute Oxygen Saturation % 94 %     5 Minute Liters of Oxygen 0 L     6 Minute Oxygen Saturation % 98 %     6 Minute Liters of Oxygen 0 L     2 Minute Post Oxygen Saturation % 100 %     2 Minute Post Liters of Oxygen 0 L              Psychological, QOL, Others - Outcomes: PHQ 2/9:    04/18/2023    8:49 AM 01/25/2023    1:03 PM 09/24/2014    8:12 AM  Depression screen PHQ 2/9  Decreased Interest 0 0 0  Down, Depressed, Hopeless 0 0 0  PHQ - 2 Score 0 0 0  Altered  sleeping 1 0   Tired, decreased energy 1 0   Change in appetite 0 0   Feeling bad or failure about yourself  0 0   Trouble concentrating 0 0   Moving slowly or fidgety/restless 0 0   Suicidal thoughts 0 0   PHQ-9 Score 2 0   Difficult doing work/chores Not difficult at all Not difficult at all     Quality of Life:   Personal Goals: Goals established at orientation with interventions provided to work toward goal.  Personal Goals and Risk Factors at Admission - 01/25/23 1307       Core Components/Risk Factors/Patient Goals on Admission   Improve shortness of breath with ADL's Yes    Intervention Provide education, individualized exercise plan and daily activity instruction to help decrease symptoms of SOB with activities of daily living.    Expected Outcomes Short Term: Improve cardiorespiratory fitness to achieve a reduction of symptoms when performing ADLs;Long Term: Be able to perform more ADLs without symptoms or delay the onset of symptoms              Personal Goals Discharge:  Goals and Risk Factor Review  Row Name 01/30/23 7846 02/22/23 1435 03/20/23 1405 04/19/23 0834       Core Components/Risk Factors/Patient Goals Review   Personal Goals Review Improve shortness of breath with ADL's;Develop more efficient breathing techniques such as purse lipped breathing and diaphragmatic breathing and practicing self-pacing with activity. Improve shortness of breath with ADL's;Develop more efficient breathing techniques such as purse lipped breathing and diaphragmatic breathing and practicing self-pacing with activity. Improve shortness of breath with ADL's Improve shortness of breath with ADL's    Review Unable to asses goals, Zahir is scheduled to start the program on 01/31/23 Monthly review of patient's Core Components/Risk Factors/Patient Goals are as follows: Goal progressing on improving Jarom's shortness of breath with ADLs. Julus has been able to increase his METS and  workload on the treadmill and upright bike. He can correctly voice his dyspnea and exertion scale scores to staff. Katlin is progressing on developing more efficient breathing techniques such as purse lipped breathing and diaphragmatic breathing; and practicing self-pacing with activity. Mariah can demonstrate purse lipped breathing and can self-pace but needs assistance with diaphragmatic breathing. We are working on this with him. Ulysses will continue to benefit from participation in PR for nutrition, education, exercise, and lifestyle modification. Monthly review of patient's Core Components/Risk Factors/Patient Goals are as follows: Goal progressing on improving Cordarrius's shortness of breath with ADLs. Alfonsa has been able to increase his METS and workload on the treadmill and upright bike. He is currently exercising on RA to keep sats >88%.  He can correctly voice his dyspnea and exertion scale scores to staff. Goal met on developing more efficient breathing techniques such as purse lipped breathing and diaphragmatic breathing; and practicing self-pacing with activity. Faizan uses purse lip breathing when he gets short of breath and can self-pace. He has been practicing diaphragmatic breathing at home and feels he is getting the hang of it. Jayshaun will continue to benefit from participation in PR for nutrition, education, exercise, and lifestyle modification. Odean graduated from the [employer] on 04/18/23. Goal was met for improving shortness of breath with ADL's. Although his initial SOB score was 1/ post 4 he states he is able to do more at home and work with less shortness of breath.    Expected Outcomes For Toddrick to improve shortness of breath with ADL's and develop more efficient breathing techniques such as purse lipped breathing and diaphragmatic breathing; and practicing self-pacing with activity and improve shortness of breath with ADL's For Kable to improve shortness of breath with ADL's and develop more  efficient breathing techniques such as purse lipped breathing and diaphragmatic breathing; and practicing self-pacing with activity and improve shortness of breath with ADL's For Callahan to improve shortness of breath with ADL's To continue to exercise and modify his nutrition and lifestyle post graduation             Exercise Goals and Review:  Exercise Goals     Row Name 01/25/23 1312             Exercise Goals   Increase Physical Activity Yes       Intervention Provide advice, education, support and counseling about physical activity/exercise needs.;Develop an individualized exercise prescription for aerobic and resistive training based on initial evaluation findings, risk stratification, comorbidities and participant's personal goals.       Expected Outcomes Short Term: Attend rehab on a regular basis to increase amount of physical activity.;Long Term: Add in home exercise to make exercise part of routine and to  increase amount of physical activity.;Long Term: Exercising regularly at least 3-5 days a week.       Increase Strength and Stamina Yes       Intervention Provide advice, education, support and counseling about physical activity/exercise needs.;Develop an individualized exercise prescription for aerobic and resistive training based on initial evaluation findings, risk stratification, comorbidities and participant's personal goals.       Expected Outcomes Short Term: Increase workloads from initial exercise prescription for resistance, speed, and METs.;Short Term: Perform resistance training exercises routinely during rehab and add in resistance training at home;Long Term: Improve cardiorespiratory fitness, muscular endurance and strength as measured by increased METs and functional capacity ( )       Able to understand and use rate of perceived exertion (RPE) scale Yes       Intervention Provide education and explanation on how to use RPE scale       Expected Outcomes Short  Term: Able to use RPE daily in rehab to express subjective intensity level;Long Term:  Able to use RPE to guide intensity level when exercising independently       Able to understand and use Dyspnea scale Yes       Intervention Provide education and explanation on how to use Dyspnea scale       Expected Outcomes Short Term: Able to use Dyspnea scale daily in rehab to express subjective sense of shortness of breath during exertion;Long Term: Able to use Dyspnea scale to guide intensity level when exercising independently       Knowledge and understanding of Target Heart Rate Range (THRR) Yes       Intervention Provide education and explanation of THRR including how the numbers were predicted and where they are located for reference       Expected Outcomes Short Term: Able to state/look up THRR;Long Term: Able to use THRR to govern intensity when exercising independently;Short Term: Able to use daily as guideline for intensity in rehab       Understanding of Exercise Prescription Yes       Intervention Provide education, explanation, and written materials on patient's individual exercise prescription       Expected Outcomes Short Term: Able to explain program exercise prescription;Long Term: Able to explain home exercise prescription to exercise independently                Exercise Goals Re-Evaluation:  Exercise Goals Re-Evaluation     Row Name 01/25/23 1428 02/20/23 0907 03/22/23 0930 04/18/23 1538       Exercise Goal Re-Evaluation   Exercise Goals Review Increase Physical Activity;Able to understand and use Dyspnea scale;Understanding of Exercise Prescription;Increase Strength and Stamina;Knowledge and understanding of Target Heart Rate Range (THRR);Able to understand and use rate of perceived exertion (RPE) scale Increase Physical Activity;Able to understand and use Dyspnea scale;Understanding of Exercise Prescription;Increase Strength and Stamina;Knowledge and understanding of Target Heart  Rate Range (THRR);Able to understand and use rate of perceived exertion (RPE) scale Increase Physical Activity;Able to understand and use Dyspnea scale;Understanding of Exercise Prescription;Increase Strength and Stamina;Knowledge and understanding of Target Heart Rate Range (THRR);Able to understand and use rate of perceived exertion (RPE) scale Increase Physical Activity;Able to understand and use Dyspnea scale;Understanding of Exercise Prescription;Increase Strength and Stamina;Knowledge and understanding of Target Heart Rate Range (THRR);Able to understand and use rate of perceived exertion (RPE) scale    Comments Pt to begin exercise 1/21. Will monitor for progression. Cairo has completed 5 exercise sessions. He exercises for 15 min  on the upright bike and treadmill. Yanuel averages 4.3 METs at level 4 on the upright bike and 4.7 METs at 3.2 mph and 3% incline on the treadmill. He has increased his workload for both exercise modes as he tolerates progressions well. We have not discussed home exercise yet, but I plan to talk to him soon. Will continue to monitor and progress as able. Atul has completed 11 exercise sessions. He exercises for 15 min on the upright bike and treadmill. Vanderbilt averages 4.3 METs at level 4.5 on the upright bike and 5.4 METs at 3.6 mph and 3.5% incline on the treadmill. Arrington has increased his speed and incline on the treadmill and level on the upright bike. His METs have increased for both exercise modes. We have discussed home exercise as Braeton exercises outside of rehab. Will continue to monitor and progress as able. Sani has completed 16 exercise sessions. His peak METs were 6.3 on the treadmill and 4.3 on the upright bike. Jhase plans to continue exercise after his surgery.    Expected Outcomes Through exercise at rehab and at home, the patient will decrease shortness of breath with daily activities and feel confident in carrying out an exercise regime at home. Through exercise  at rehab and at home, the patient will decrease shortness of breath with daily activities and feel confident in carrying out an exercise regime at home. Through exercise at rehab and at home, the patient will decrease shortness of breath with daily activities and feel confident in carrying out an exercise regime at home. Through exercise at rehab and at home, the patient will decrease shortness of breath with daily activities and feel confident in carrying out an exercise regime at home.             Nutrition & Weight - Outcomes:    Nutrition:  Nutrition Therapy & Goals - 03/20/23 1050       Nutrition Therapy   Diet General Healthy Diet    Drug/Food Interactions Statins/Certain Fruits      Personal Nutrition Goals   Nutrition Goal Patient to improve diet quality by using the plate method as a guide for meal planning to include lean protein/plant protein, fruits, vegetables, whole grains, nonfat dairy as part of a well-balanced diet.    Comments Derryl has medical history of lung cancer, hyerlipidemia, tobacco use. He did stop smoking in November 2024. He has completed chemotherapy, immunotherapy and is scheduled for surgery 04/19/2023. He is down 2.4# since starting with our program. Patient will continue benefit from participation in pulmonary rehab for nutrition, exercise, and lifestyle modification support.      Intervention Plan   Intervention Prescribe, educate and counsel regarding individualized specific dietary modifications aiming towards targeted core components such as weight, hypertension, lipid management, diabetes, heart failure and other comorbidities.;Nutrition handout(s) given to patient.    Expected Outcomes Short Term Goal: Understand basic principles of dietary content, such as calories, fat, sodium, cholesterol and nutrients.;Long Term Goal: Adherence to prescribed nutrition plan.             Nutrition Discharge:   Education Questionnaire Score:  Knowledge  Questionnaire Score - 04/18/23 0848       Knowledge Questionnaire Score   Post Score 16/18             Goals reviewed with patient; copy given to patient.

## 2023-04-20 ENCOUNTER — Encounter (HOSPITAL_COMMUNITY): Admission: RE | Admit: 2023-04-20 | Payer: HMO | Source: Ambulatory Visit

## 2023-04-27 DIAGNOSIS — E785 Hyperlipidemia, unspecified: Secondary | ICD-10-CM | POA: Diagnosis not present

## 2023-04-27 DIAGNOSIS — R001 Bradycardia, unspecified: Secondary | ICD-10-CM | POA: Diagnosis not present

## 2023-04-27 DIAGNOSIS — R0600 Dyspnea, unspecified: Secondary | ICD-10-CM | POA: Diagnosis not present

## 2023-04-27 DIAGNOSIS — R0602 Shortness of breath: Secondary | ICD-10-CM | POA: Diagnosis not present

## 2023-04-27 DIAGNOSIS — I2699 Other pulmonary embolism without acute cor pulmonale: Secondary | ICD-10-CM | POA: Diagnosis not present

## 2023-04-27 DIAGNOSIS — L259 Unspecified contact dermatitis, unspecified cause: Secondary | ICD-10-CM | POA: Diagnosis not present

## 2023-04-27 DIAGNOSIS — Z902 Acquired absence of lung [part of]: Secondary | ICD-10-CM | POA: Diagnosis not present

## 2023-04-27 DIAGNOSIS — Z85118 Personal history of other malignant neoplasm of bronchus and lung: Secondary | ICD-10-CM | POA: Diagnosis not present

## 2023-04-27 DIAGNOSIS — K5900 Constipation, unspecified: Secondary | ICD-10-CM | POA: Diagnosis not present

## 2023-04-27 DIAGNOSIS — R0902 Hypoxemia: Secondary | ICD-10-CM | POA: Diagnosis not present

## 2023-04-27 DIAGNOSIS — J95811 Postprocedural pneumothorax: Secondary | ICD-10-CM | POA: Diagnosis not present

## 2023-04-27 DIAGNOSIS — J9383 Other pneumothorax: Secondary | ICD-10-CM | POA: Diagnosis not present

## 2023-04-27 DIAGNOSIS — C3412 Malignant neoplasm of upper lobe, left bronchus or lung: Secondary | ICD-10-CM | POA: Diagnosis not present

## 2023-04-27 DIAGNOSIS — I959 Hypotension, unspecified: Secondary | ICD-10-CM | POA: Diagnosis not present

## 2023-04-27 DIAGNOSIS — R0789 Other chest pain: Secondary | ICD-10-CM | POA: Diagnosis not present

## 2023-04-27 DIAGNOSIS — Z87891 Personal history of nicotine dependence: Secondary | ICD-10-CM | POA: Diagnosis not present

## 2023-04-27 DIAGNOSIS — I517 Cardiomegaly: Secondary | ICD-10-CM | POA: Diagnosis not present

## 2023-04-27 DIAGNOSIS — C3490 Malignant neoplasm of unspecified part of unspecified bronchus or lung: Secondary | ICD-10-CM | POA: Diagnosis not present

## 2023-04-27 DIAGNOSIS — R000 Tachycardia, unspecified: Secondary | ICD-10-CM | POA: Diagnosis not present

## 2023-04-27 DIAGNOSIS — J90 Pleural effusion, not elsewhere classified: Secondary | ICD-10-CM | POA: Diagnosis not present

## 2023-04-27 DIAGNOSIS — R079 Chest pain, unspecified: Secondary | ICD-10-CM | POA: Diagnosis not present

## 2023-04-27 DIAGNOSIS — Z7901 Long term (current) use of anticoagulants: Secondary | ICD-10-CM | POA: Diagnosis not present

## 2023-04-27 DIAGNOSIS — J948 Other specified pleural conditions: Secondary | ICD-10-CM | POA: Diagnosis not present

## 2023-04-27 DIAGNOSIS — D638 Anemia in other chronic diseases classified elsewhere: Secondary | ICD-10-CM | POA: Diagnosis not present

## 2023-04-27 DIAGNOSIS — I2694 Multiple subsegmental pulmonary emboli without acute cor pulmonale: Secondary | ICD-10-CM | POA: Diagnosis not present

## 2023-05-03 ENCOUNTER — Telehealth: Payer: Self-pay | Admitting: Medical Oncology

## 2023-05-03 NOTE — Telephone Encounter (Signed)
 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    He has a chest tube and discharge pending based on Cardiothoracic consult re CTube . I gave Odilia Bennett, RN the date of pts next appt. Aaron Aas

## 2023-05-09 ENCOUNTER — Inpatient Hospital Stay: Payer: HMO

## 2023-05-09 ENCOUNTER — Inpatient Hospital Stay: Payer: HMO | Attending: Internal Medicine | Admitting: Internal Medicine

## 2023-05-09 VITALS — BP 100/73 | HR 66 | Temp 98.1°F | Resp 17 | Ht 68.0 in | Wt 193.4 lb

## 2023-05-09 DIAGNOSIS — Z79899 Other long term (current) drug therapy: Secondary | ICD-10-CM | POA: Diagnosis not present

## 2023-05-09 DIAGNOSIS — D649 Anemia, unspecified: Secondary | ICD-10-CM | POA: Diagnosis not present

## 2023-05-09 DIAGNOSIS — C3412 Malignant neoplasm of upper lobe, left bronchus or lung: Secondary | ICD-10-CM

## 2023-05-09 DIAGNOSIS — Z85118 Personal history of other malignant neoplasm of bronchus and lung: Secondary | ICD-10-CM | POA: Diagnosis not present

## 2023-05-09 DIAGNOSIS — C3490 Malignant neoplasm of unspecified part of unspecified bronchus or lung: Secondary | ICD-10-CM | POA: Diagnosis not present

## 2023-05-09 DIAGNOSIS — Z86711 Personal history of pulmonary embolism: Secondary | ICD-10-CM | POA: Insufficient documentation

## 2023-05-09 DIAGNOSIS — Z902 Acquired absence of lung [part of]: Secondary | ICD-10-CM | POA: Diagnosis not present

## 2023-05-09 LAB — CBC WITH DIFFERENTIAL (CANCER CENTER ONLY)
Abs Immature Granulocytes: 0.02 10*3/uL (ref 0.00–0.07)
Basophils Absolute: 0.1 10*3/uL (ref 0.0–0.1)
Basophils Relative: 1 %
Eosinophils Absolute: 0.3 10*3/uL (ref 0.0–0.5)
Eosinophils Relative: 5 %
HCT: 32.2 % — ABNORMAL LOW (ref 39.0–52.0)
Hemoglobin: 10.5 g/dL — ABNORMAL LOW (ref 13.0–17.0)
Immature Granulocytes: 0 %
Lymphocytes Relative: 34 %
Lymphs Abs: 2.6 10*3/uL (ref 0.7–4.0)
MCH: 29.2 pg (ref 26.0–34.0)
MCHC: 32.6 g/dL (ref 30.0–36.0)
MCV: 89.4 fL (ref 80.0–100.0)
Monocytes Absolute: 0.9 10*3/uL (ref 0.1–1.0)
Monocytes Relative: 12 %
Neutro Abs: 3.7 10*3/uL (ref 1.7–7.7)
Neutrophils Relative %: 48 %
Platelet Count: 337 10*3/uL (ref 150–400)
RBC: 3.6 MIL/uL — ABNORMAL LOW (ref 4.22–5.81)
RDW: 13 % (ref 11.5–15.5)
WBC Count: 7.6 10*3/uL (ref 4.0–10.5)
nRBC: 0 % (ref 0.0–0.2)

## 2023-05-09 LAB — CMP (CANCER CENTER ONLY)
ALT: 39 U/L (ref 0–44)
AST: 20 U/L (ref 15–41)
Albumin: 3.7 g/dL (ref 3.5–5.0)
Alkaline Phosphatase: 45 U/L (ref 38–126)
Anion gap: 7 (ref 5–15)
BUN: 17 mg/dL (ref 8–23)
CO2: 28 mmol/L (ref 22–32)
Calcium: 9.5 mg/dL (ref 8.9–10.3)
Chloride: 102 mmol/L (ref 98–111)
Creatinine: 0.81 mg/dL (ref 0.61–1.24)
GFR, Estimated: >60 mL/min (ref >60)
Glucose, Bld: 97 mg/dL (ref 70–99)
Potassium: 4.6 mmol/L (ref 3.5–5.1)
Sodium: 137 mmol/L (ref 135–145)
Total Bilirubin: 0.4 mg/dL (ref 0.0–1.2)
Total Protein: 7.9 g/dL (ref 6.5–8.1)

## 2023-05-09 NOTE — Progress Notes (Signed)
 [facility] [phone]   Fax:(336) [phone]  OFFICE PROGRESS NOTE  Mordechai April, DO [address] [place] [address]  DIAGNOSIS: Stage IIB (T3, N0, M0) non-small cell lung cancer, squamous cell carcinoma presented with left suprahilar mass with suspicious postobstructive pneumonitis/lymphangitic spread and satellite nodules diagnosed in November 2024.   Biomarker Findings HRD signature - HRDsig Negative Microsatellite status - MS-Stable Tumor Mutational Burden - 7 Muts/Mb Genomic Findings For a complete list of the genes assayed, please refer to the Appendix. PTCH1 K529* RB1 splice site 1499-2A>T TP53 H178P, E271K 8 Disease relevant genes with no reportable alterations: ALK, BRAF, EGFR, ERBB2, KRAS, MET, RET, ROS1  PDL1 TPS: 90%  PRIOR THERAPY:  1) Neoadjuvant chemoimmunotherapy with carboplatin  for AUC of 6, paclitaxel  200 Mg/M2 and nivolumab  360 Mg IV every 3 weeks with Neulasta  support for total of 4 cycles.  Status post 4 cycles. 2) left upper lobectomy with lymph node dissection under the care of Dr. Georgine Kitchens at [facility] and the final pathology showed no evidence for residual disease on 04/19/2023.  CURRENT THERAPY: Observation  INTERVAL HISTORY: Raymond Velez 68 y.o. male returns to the clinic today for follow-up visit and his daughter Sterling Eisenmenger was available by phone during the visit. Discussed the use of AI scribe software for clinical note transcription with the patient, who gave verbal consent to proceed.  History of Present Illness   Raymond Velez is a 68 year old male with stage 2B non-small cell lung cancer who presents for follow-up after surgery and chemotherapy.  He was diagnosed with stage 2B non-small cell lung cancer, squamous cell carcinoma, in November 2024. He underwent four cycles of neoadjuvant systemic chemotherapy with carboplatin , paclitaxel , and nivolumab , followed by a left upper lobectomy with lymph node  dissection. As of April 19, 2023, there was no evidence of residual disease.  Post-surgery, he experienced complications including shortness of breath and was found to have a pulmonary embolism and a left-sided hydropneumothorax, which required readmission and placement of a second chest tube. Despite these interventions, some fluid remains, as noted in recent x-rays, but it is considered stable enough to wait for further evaluation.  He has nerve pain on the outside of both thighs, exacerbated by inactivity. He has a history of lower back issues, possibly related to his previous occupation as a [employer], which may contribute to his current symptoms. He also notes difficulty walking and has experienced weight loss, having lost at least eight pounds recently.  He has mild anemia post-surgery and a hematoma and fluid collection around his surgical incision site, which is being monitored.       MEDICAL HISTORY: Past Medical History:  Diagnosis Date   Arthritis    Hyperlipidemia    RBBB     ALLERGIES:  has no known allergies.  MEDICATIONS:  Current Outpatient Medications  Medication Sig Dispense Refill   atorvastatin  (LIPITOR) 20 MG tablet Take 20 mg by mouth daily.     No current facility-administered medications for this visit.   Facility-Administered Medications Ordered in Other Visits  Medication Dose Route Frequency Provider Last Rate Last Admin   0.9 %  sodium chloride  infusion   Intravenous Continuous Marlene Simas, MD   Stopped at 03/01/23 1417    SURGICAL HISTORY:  Past Surgical History:  Procedure Laterality Date   BRONCHIAL BIOPSY  11/14/2022   Procedure: BRONCHIAL BIOPSIES;  Surgeon: Denson Flake, MD;  Location: [facility] ENDOSCOPY;  Service: Pulmonary;;  BRONCHIAL BRUSHINGS  11/14/2022   Procedure: BRONCHIAL BRUSHINGS;  Surgeon: Denson Flake, MD;  Location: [facility] ENDOSCOPY;  Service: Pulmonary;;   COLONOSCOPY      x 3   HEMOSTASIS CONTROL  11/14/2022   Procedure:  HEMOSTASIS CONTROL;  Surgeon: Denson Flake, MD;  Location: [facility] ENDOSCOPY;  Service: Pulmonary;;  epi and cold saline   TOTAL HIP ARTHROPLASTY Left 10/09/2018   Procedure: TOTAL HIP ARTHROPLASTY ANTERIOR APPROACH;  Surgeon: Claiborne Crew, MD;  Location: [facility] ORS;  Service: Orthopedics;  Laterality: Left;  70 mins   VIDEO BRONCHOSCOPY N/A 11/14/2022   Procedure: VIDEO BRONCHOSCOPY;  Surgeon: Denson Flake, MD;  Location: [facility] ENDOSCOPY;  Service: Pulmonary;  Laterality: N/A;    REVIEW OF SYSTEMS:  Constitutional: positive for fatigue and weight loss Eyes: negative Ears, nose, mouth, throat, and face: negative Respiratory: positive for dyspnea on exertion and pleurisy/chest pain Cardiovascular: negative Gastrointestinal: negative Genitourinary:negative Integument/breast: negative Hematologic/lymphatic: negative Musculoskeletal:negative Neurological: positive for paresthesia Behavioral/Psych: negative Endocrine: negative Allergic/Immunologic: negative   PHYSICAL EXAMINATION: General appearance: alert, cooperative, fatigued, and no distress Head: Normocephalic, without obvious abnormality, atraumatic Neck: no adenopathy, no JVD, supple, symmetrical, trachea midline, and thyroid  not enlarged, symmetric, no tenderness/mass/nodules Lymph nodes: Cervical, supraclavicular, and axillary nodes normal. Resp: clear to auscultation bilaterally Back: symmetric, no curvature. ROM normal. No CVA tenderness. Cardio: regular rate and rhythm, S1, S2 normal, no murmur, click, rub or gallop GI: soft, non-tender; bowel sounds normal; no masses,  no organomegaly Extremities: extremities normal, atraumatic, no cyanosis or edema Neurologic: Alert and oriented X 3, normal strength and tone. Normal symmetric reflexes. Normal coordination and gait  ECOG PERFORMANCE STATUS: 1 - Symptomatic but completely ambulatory  Blood pressure 100/73, pulse 66, temperature 98.1 F (36.7 C), temperature source Temporal, resp.  rate 17, height 5\' 8"  (1.727 m), weight 193 lb 6.4 oz (87.7 kg), SpO2 100%.  LABORATORY DATA: Lab Results  Component Value Date   WBC 7.6 05/09/2023   HGB 10.5 (L) 05/09/2023   HCT 32.2 (L) 05/09/2023   MCV 89.4 05/09/2023   PLT 337 05/09/2023      Chemistry      Component Value Date/Time   NA 137 05/09/2023 1316   K 4.6 05/09/2023 1316   CL 102 05/09/2023 1316   CO2 28 05/09/2023 1316   BUN 17 05/09/2023 1316   CREATININE 0.81 05/09/2023 1316   CREATININE 0.69 (L) 09/24/2014 0828      Component Value Date/Time   CALCIUM  9.5 05/09/2023 1316   ALKPHOS 45 05/09/2023 1316   AST 20 05/09/2023 1316   ALT 39 05/09/2023 1316   BILITOT 0.4 05/09/2023 1316       RADIOGRAPHIC STUDIES: No results found.  ASSESSMENT AND PLAN: This is a very pleasant 68 years old white male with Stage IIB (T3, N0, M0) non-small cell lung cancer, squamous cell carcinoma presented with left suprahilar mass with suspicious postobstructive pneumonitis/lymphangitic spread and satellite nodules diagnosed in November 2024.  He is currently undergoing neoadjuvant chemoimmunotherapy with carboplatin  for AUC of 6, paclitaxel  200 Mg/M2 and nivolumab  360 mg IV every 3 weeks with growth factor support status post 4 cycles. He underwent left upper lobectomy with lymph node dissection under the care of Dr. Georgine Kitchens at [facility] and the final pathology showed no evidence of residual disease.    Stage IIb non-small cell lung cancer, squamous cell carcinoma Diagnosed in November 2024. Status post four cycles of neoadjuvant systemic chemotherapy with carboplatin , paclitaxel , and nivolumab  followed by left  upper lobectomy with lymph node dissection. No evidence of residual disease as of 04/19/2023. A 7 mm nodule on the right upper lobe noted on CT, previously identified as a 4 mm benign calcification. Monitoring required to assess for changes. If the nodule shows growth and is soft tissue, radiation may be considered.  No additional chemotherapy or immunotherapy is needed at this time due to the absence of residual cancer. - Order follow-up scan in 3 months to monitor right upper lobe nodule. - Coordinate with cardiothoracic surgeon regarding scan orders to avoid duplication.  Pulmonary embolism Post-surgical complication following lobectomy. Managed during hospitalization at [facility].  Hydropneumothorax Post-surgical complication on the left side following lobectomy. Managed with a second chest tube placement. Residual fluid noted on recent x-ray, stable enough to wait for follow-up with cardiothoracic surgeon.  Mild anemia Likely due to recent major surgery. Hemoglobin levels need to be rebuilt. - Advise dietary intake of iron-rich foods to improve hemoglobin levels.  Nerve pain in thighs Possibly related to degenerative disc disease or post-surgical changes. Differential includes nerve damage from chemotherapy, though less likely given the localized nature of the pain. - Refer to orthopedic surgeon for evaluation of potential spinal stenosis or other causes. - Encourage gradual increase in physical activity to regain muscle strength.   The patient was advised to call immediately if he has any concerning symptoms in the interval. The patient voices understanding of current disease status and treatment options and is in agreement with the current care plan.  All questions were answered. The patient knows to call the clinic with any problems, questions or concerns. We can certainly see the patient much sooner if necessary.  The total time spent in the appointment was 30 minutes.  Disclaimer: This note was dictated with voice recognition software. Similar sounding words can inadvertently be transcribed and may not be corrected upon review.

## 2023-05-10 DIAGNOSIS — Z09 Encounter for follow-up examination after completed treatment for conditions other than malignant neoplasm: Secondary | ICD-10-CM | POA: Diagnosis not present

## 2023-05-10 DIAGNOSIS — C3492 Malignant neoplasm of unspecified part of left bronchus or lung: Secondary | ICD-10-CM | POA: Diagnosis not present

## 2023-05-10 DIAGNOSIS — J439 Emphysema, unspecified: Secondary | ICD-10-CM | POA: Diagnosis not present

## 2023-05-10 DIAGNOSIS — Z86711 Personal history of pulmonary embolism: Secondary | ICD-10-CM | POA: Diagnosis not present

## 2023-05-12 ENCOUNTER — Other Ambulatory Visit: Payer: Self-pay

## 2023-05-12 DIAGNOSIS — Z79899 Other long term (current) drug therapy: Secondary | ICD-10-CM | POA: Diagnosis not present

## 2023-05-12 DIAGNOSIS — J984 Other disorders of lung: Secondary | ICD-10-CM | POA: Diagnosis not present

## 2023-05-12 DIAGNOSIS — Z4802 Encounter for removal of sutures: Secondary | ICD-10-CM | POA: Diagnosis not present

## 2023-05-12 DIAGNOSIS — Z87891 Personal history of nicotine dependence: Secondary | ICD-10-CM | POA: Diagnosis not present

## 2023-05-12 DIAGNOSIS — Z902 Acquired absence of lung [part of]: Secondary | ICD-10-CM | POA: Diagnosis not present

## 2023-05-12 DIAGNOSIS — R61 Generalized hyperhidrosis: Secondary | ICD-10-CM | POA: Diagnosis not present

## 2023-05-12 DIAGNOSIS — C3412 Malignant neoplasm of upper lobe, left bronchus or lung: Secondary | ICD-10-CM | POA: Diagnosis not present

## 2023-05-12 DIAGNOSIS — C3492 Malignant neoplasm of unspecified part of left bronchus or lung: Secondary | ICD-10-CM | POA: Diagnosis not present

## 2023-05-12 DIAGNOSIS — R931 Abnormal findings on diagnostic imaging of heart and coronary circulation: Secondary | ICD-10-CM | POA: Diagnosis not present

## 2023-05-12 DIAGNOSIS — J90 Pleural effusion, not elsewhere classified: Secondary | ICD-10-CM | POA: Diagnosis not present

## 2023-05-12 DIAGNOSIS — R9389 Abnormal findings on diagnostic imaging of other specified body structures: Secondary | ICD-10-CM | POA: Diagnosis not present

## 2023-05-12 DIAGNOSIS — I2693 Single subsegmental pulmonary embolism without acute cor pulmonale: Secondary | ICD-10-CM | POA: Diagnosis not present

## 2023-05-12 DIAGNOSIS — Z7901 Long term (current) use of anticoagulants: Secondary | ICD-10-CM | POA: Diagnosis not present

## 2023-07-21 DIAGNOSIS — Z86711 Personal history of pulmonary embolism: Secondary | ICD-10-CM | POA: Diagnosis not present

## 2023-07-21 DIAGNOSIS — R490 Dysphonia: Secondary | ICD-10-CM | POA: Diagnosis not present

## 2023-07-31 ENCOUNTER — Inpatient Hospital Stay: Attending: Internal Medicine

## 2023-07-31 DIAGNOSIS — Z85118 Personal history of other malignant neoplasm of bronchus and lung: Secondary | ICD-10-CM | POA: Insufficient documentation

## 2023-07-31 DIAGNOSIS — C3412 Malignant neoplasm of upper lobe, left bronchus or lung: Secondary | ICD-10-CM

## 2023-07-31 LAB — CBC WITH DIFFERENTIAL (CANCER CENTER ONLY)
Abs Immature Granulocytes: 0.05 K/uL (ref 0.00–0.07)
Basophils Absolute: 0 K/uL (ref 0.0–0.1)
Basophils Relative: 0 %
Eosinophils Absolute: 0.2 K/uL (ref 0.0–0.5)
Eosinophils Relative: 2 %
HCT: 36.1 % — ABNORMAL LOW (ref 39.0–52.0)
Hemoglobin: 11.8 g/dL — ABNORMAL LOW (ref 13.0–17.0)
Immature Granulocytes: 1 %
Lymphocytes Relative: 31 %
Lymphs Abs: 2.9 K/uL (ref 0.7–4.0)
MCH: 27.3 pg (ref 26.0–34.0)
MCHC: 32.7 g/dL (ref 30.0–36.0)
MCV: 83.6 fL (ref 80.0–100.0)
Monocytes Absolute: 1.3 K/uL — ABNORMAL HIGH (ref 0.1–1.0)
Monocytes Relative: 13 %
Neutro Abs: 5.1 K/uL (ref 1.7–7.7)
Neutrophils Relative %: 53 %
Platelet Count: 235 K/uL (ref 150–400)
RBC: 4.32 MIL/uL (ref 4.22–5.81)
RDW: 16.4 % — ABNORMAL HIGH (ref 11.5–15.5)
WBC Count: 9.5 K/uL (ref 4.0–10.5)
nRBC: 0 % (ref 0.0–0.2)

## 2023-07-31 LAB — CMP (CANCER CENTER ONLY)
ALT: 24 U/L (ref 0–44)
AST: 18 U/L (ref 15–41)
Albumin: 3.9 g/dL (ref 3.5–5.0)
Alkaline Phosphatase: 61 U/L (ref 38–126)
Anion gap: 6 (ref 5–15)
BUN: 16 mg/dL (ref 8–23)
CO2: 29 mmol/L (ref 22–32)
Calcium: 9.2 mg/dL (ref 8.9–10.3)
Chloride: 100 mmol/L (ref 98–111)
Creatinine: 0.79 mg/dL (ref 0.61–1.24)
GFR, Estimated: >60 mL/min (ref >60)
Glucose, Bld: 112 mg/dL — ABNORMAL HIGH (ref 70–99)
Potassium: 4.2 mmol/L (ref 3.5–5.1)
Sodium: 135 mmol/L (ref 135–145)
Total Bilirubin: 0.6 mg/dL (ref 0.0–1.2)
Total Protein: 7.4 g/dL (ref 6.5–8.1)

## 2023-08-01 ENCOUNTER — Telehealth: Payer: Self-pay | Admitting: Internal Medicine

## 2023-08-01 NOTE — Telephone Encounter (Signed)
 The patients daughter called to reschedule her fathers appointment due to Dr.Mohamed wanting the follow up to be after the patient sees the doctor at [facility].

## 2023-08-02 ENCOUNTER — Other Ambulatory Visit: Payer: Self-pay

## 2023-08-07 ENCOUNTER — Ambulatory Visit: Admitting: Internal Medicine

## 2023-08-11 DIAGNOSIS — C3412 Malignant neoplasm of upper lobe, left bronchus or lung: Secondary | ICD-10-CM | POA: Diagnosis not present

## 2023-08-11 DIAGNOSIS — C3492 Malignant neoplasm of unspecified part of left bronchus or lung: Secondary | ICD-10-CM | POA: Diagnosis not present

## 2023-08-18 DIAGNOSIS — R490 Dysphonia: Secondary | ICD-10-CM | POA: Diagnosis not present

## 2023-08-18 DIAGNOSIS — I2510 Atherosclerotic heart disease of native coronary artery without angina pectoris: Secondary | ICD-10-CM | POA: Diagnosis not present

## 2023-08-18 DIAGNOSIS — K219 Gastro-esophageal reflux disease without esophagitis: Secondary | ICD-10-CM | POA: Diagnosis not present

## 2023-08-18 DIAGNOSIS — C3492 Malignant neoplasm of unspecified part of left bronchus or lung: Secondary | ICD-10-CM | POA: Diagnosis not present

## 2023-08-22 ENCOUNTER — Inpatient Hospital Stay: Attending: Internal Medicine | Admitting: Internal Medicine

## 2023-08-22 VITALS — BP 114/71 | HR 74 | Temp 98.3°F | Resp 17 | Ht 68.0 in | Wt 206.0 lb

## 2023-08-22 DIAGNOSIS — Z85118 Personal history of other malignant neoplasm of bronchus and lung: Secondary | ICD-10-CM | POA: Insufficient documentation

## 2023-08-22 DIAGNOSIS — C3490 Malignant neoplasm of unspecified part of unspecified bronchus or lung: Secondary | ICD-10-CM | POA: Diagnosis not present

## 2023-08-22 DIAGNOSIS — Z902 Acquired absence of lung [part of]: Secondary | ICD-10-CM | POA: Insufficient documentation

## 2023-08-22 NOTE — Progress Notes (Signed)
 [facility] Telephone:([phone]   Fax:(336) [phone]  OFFICE PROGRESS NOTE  [name], DO [address] [place] [address]  DIAGNOSIS: Stage IIB (T3, N0, M0) non-small cell lung cancer, squamous cell carcinoma presented with left suprahilar mass with suspicious postobstructive pneumonitis/lymphangitic spread and satellite nodules diagnosed in November 2024.   Biomarker Findings HRD signature - HRDsig Negative Microsatellite status - MS-Stable Tumor Mutational Burden - 7 Muts/Mb Genomic Findings For a complete list of the genes assayed, please refer to the Appendix. PTCH1 K529* RB1 splice site 1499-2A>T TP53 H178P, E271K 8 Disease relevant genes with no reportable alterations: ALK, BRAF, EGFR, ERBB2, KRAS, MET, RET, ROS1  PDL1 TPS: 90%  PRIOR THERAPY:  1) Neoadjuvant chemoimmunotherapy with carboplatin  for AUC of 6, paclitaxel  200 Mg/M2 and nivolumab  360 Mg IV every 3 weeks with Neulasta  support for total of 4 cycles.  Status post 4 cycles. 2) left upper lobectomy with lymph node dissection under the care of Dr. Elmo at [facility] and the final pathology showed no evidence for residual disease on 04/19/2023.  CURRENT THERAPY: Observation  INTERVAL HISTORY: Raymond Velez 68 y.o. male returns to the clinic today for follow-up visit and his daughter Raymond Velez was available by phone during the visit. Discussed the use of AI scribe software for clinical note transcription with the patient, who gave verbal consent to proceed.  History of Present Illness Raymond Velez is a 68 year old male with stage 2B non-small cell lung cancer who presents for evaluation with repeat blood work. He is accompanied by his daughter.  He has a history of stage 2B non-small cell lung cancer, squamous cell carcinoma, diagnosed in November 2024. The cancer was found to have no actionable mutations and a PD-L1 expression of 90%. He underwent neoadjuvant  chemoimmunotherapy with carboplatin , paclitaxel , and nivolumab  every three weeks for four cycles, followed by a left upper lobectomy with lymph node dissection on April 19, 2023, at [facility] Since then, he has been under observation.  He was supposed to have a repeat CT scan of the chest before this visit, but it was not done. However, he had a CT scan at [facility] on August 11, 2023, which showed interval post-surgical changes of the left upper lobectomy.  He experiences some shortness of breath when walking uphill, which he attributes to the surgery. He also reports a sensation of light burning on the left side, which he associates with nerve damage from the surgery. No chest pain, breathing issues, nausea, vomiting, or diarrhea.  He mentions a conversation with his surgeon about a 'little thing' that needs to be removed via bronchoscopy, which the surgeon assured is not cancerous.     MEDICAL HISTORY: Past Medical History:  Diagnosis Date   Arthritis    Hyperlipidemia    RBBB     ALLERGIES:  has no known allergies.  MEDICATIONS:  Current Outpatient Medications  Medication Sig Dispense Refill   atorvastatin  (LIPITOR) 20 MG tablet Take 20 mg by mouth daily.     No current facility-administered medications for this visit.   Facility-Administered Medications Ordered in Other Visits  Medication Dose Route Frequency Provider Last Rate Last Admin   0.9 %  sodium chloride  infusion   Intravenous Continuous Sherrod Sherrod, MD   Stopped at 03/01/23 1417    SURGICAL HISTORY:  Past Surgical History:  Procedure Laterality Date   BRONCHIAL BIOPSY  11/14/2022   Procedure: BRONCHIAL BIOPSIES;  Surgeon: Shelah Lamar RAMAN,  MD;  Location: [facility] ENDOSCOPY;  Service: Pulmonary;;   BRONCHIAL BRUSHINGS  11/14/2022   Procedure: BRONCHIAL BRUSHINGS;  Surgeon: Shelah Lamar RAMAN, MD;  Location: [facility] ENDOSCOPY;  Service: Pulmonary;;   COLONOSCOPY      x 3   HEMOSTASIS CONTROL  11/14/2022    Procedure: HEMOSTASIS CONTROL;  Surgeon: Shelah Lamar RAMAN, MD;  Location: [facility] ENDOSCOPY;  Service: Pulmonary;;  epi and cold saline   TOTAL HIP ARTHROPLASTY Left 10/09/2018   Procedure: TOTAL HIP ARTHROPLASTY ANTERIOR APPROACH;  Surgeon: Ernie Cough, MD;  Location: [facility] ORS;  Service: Orthopedics;  Laterality: Left;  70 mins   VIDEO BRONCHOSCOPY N/A 11/14/2022   Procedure: VIDEO BRONCHOSCOPY;  Surgeon: Shelah Lamar RAMAN, MD;  Location: [facility] ENDOSCOPY;  Service: Pulmonary;  Laterality: N/A;    REVIEW OF SYSTEMS:  Constitutional: negative Eyes: negative Ears, nose, mouth, throat, and face: negative Respiratory: positive for dyspnea on exertion Cardiovascular: negative Gastrointestinal: negative Genitourinary:negative Integument/breast: negative Hematologic/lymphatic: negative Musculoskeletal:negative Neurological: positive for paresthesia Behavioral/Psych: negative Endocrine: negative Allergic/Immunologic: negative   PHYSICAL EXAMINATION: General appearance: alert, cooperative, fatigued, and no distress Head: Normocephalic, without obvious abnormality, atraumatic Neck: no adenopathy, no JVD, supple, symmetrical, trachea midline, and thyroid  not enlarged, symmetric, no tenderness/mass/nodules Lymph nodes: Cervical, supraclavicular, and axillary nodes normal. Resp: clear to auscultation bilaterally Back: symmetric, no curvature. ROM normal. No CVA tenderness. Cardio: regular rate and rhythm, S1, S2 normal, no murmur, click, rub or gallop GI: soft, non-tender; bowel sounds normal; no masses,  no organomegaly Extremities: extremities normal, atraumatic, no cyanosis or edema Neurologic: Alert and oriented X 3, normal strength and tone. Normal symmetric reflexes. Normal coordination and gait  ECOG PERFORMANCE STATUS: 1 - Symptomatic but completely ambulatory  Blood pressure 114/71, pulse 74, temperature 98.3 F (36.8 C), temperature source Temporal, resp. rate 17, height 5' 8 (1.727 m), weight  206 lb (93.4 kg), SpO2 99%.  LABORATORY DATA: Lab Results  Component Value Date   WBC 9.5 07/31/2023   HGB 11.8 (L) 07/31/2023   HCT 36.1 (L) 07/31/2023   MCV 83.6 07/31/2023   PLT 235 07/31/2023      Chemistry      Component Value Date/Time   NA 135 07/31/2023 0746   K 4.2 07/31/2023 0746   CL 100 07/31/2023 0746   CO2 29 07/31/2023 0746   BUN 16 07/31/2023 0746   CREATININE 0.79 07/31/2023 0746   CREATININE 0.69 (L) 09/24/2014 0828      Component Value Date/Time   CALCIUM  9.2 07/31/2023 0746   ALKPHOS 61 07/31/2023 0746   AST 18 07/31/2023 0746   ALT 24 07/31/2023 0746   BILITOT 0.6 07/31/2023 0746       RADIOGRAPHIC STUDIES: No results found.  ASSESSMENT AND PLAN: This is a very pleasant 68 years old white male with Stage IIB (T3, N0, M0) non-small cell lung cancer, squamous cell carcinoma presented with left suprahilar mass with suspicious postobstructive pneumonitis/lymphangitic spread and satellite nodules diagnosed in November 2024.  He is currently undergoing neoadjuvant chemoimmunotherapy with carboplatin  for AUC of 6, paclitaxel  200 Mg/M2 and nivolumab  360 mg IV every 3 weeks with growth factor support status post 4 cycles. He underwent left upper lobectomy with lymph node dissection under the care of Dr. Elmo at [facility] and the final pathology showed no evidence of residual disease. He had repeat CT scan of the chest performed at [facility] on August 11, 2023 and which showed no evidence for disease recurrence. Assessment and Plan Assessment & Plan Stage IIB  non-small cell lung cancer, status post left upper lobectomy, on surveillance History of Stage IIB non-small cell lung cancer, squamous cell carcinoma, diagnosed in November 2024. Underwent neoadjuvant chemoimmunotherapy with carboplatin , paclitaxel , and nivolumab , followed by left upper lobectomy with lymph node dissection on April 19, 2023. Currently on surveillance. Recent CT scan from August 11, 2023, shows interval post-surgical changes without evidence of residual or recurrent disease. No new symptoms reported. - Continue surveillance with follow-up in four months. - Perform CT scan in four months, either at this facility or at [facility], ensuring a copy is sent to this office.  Post-thoracotomy neuropathic pain, left chest wall Experiencing post-thoracotomy neuropathic pain in the left chest wall, likely due to nerve damage during surgery. Described as a light burning sensation. Symptoms are improving over time.  Dyspnea on exertion after lung surgery Reports dyspnea on exertion, particularly when walking uphill. This is expected post-surgery and is consistent with his recent surgical history. No acute changes or additional symptoms reported. He was advised to call immediately if he has any other concerning symptoms in the interval.  The patient voices understanding of current disease status and treatment options and is in agreement with the current care plan.  All questions were answered. The patient knows to call the clinic with any problems, questions or concerns. We can certainly see the patient much sooner if necessary.  The total time spent in the appointment was 30 minutes.  Disclaimer: This note was dictated with voice recognition software. Similar sounding words can inadvertently be transcribed and may not be corrected upon review.

## 2023-08-23 DIAGNOSIS — J3801 Paralysis of vocal cords and larynx, unilateral: Secondary | ICD-10-CM | POA: Diagnosis not present

## 2023-08-23 DIAGNOSIS — R490 Dysphonia: Secondary | ICD-10-CM | POA: Diagnosis not present

## 2023-08-23 DIAGNOSIS — J383 Other diseases of vocal cords: Secondary | ICD-10-CM | POA: Diagnosis not present

## 2023-08-23 DIAGNOSIS — R1310 Dysphagia, unspecified: Secondary | ICD-10-CM | POA: Diagnosis not present

## 2023-08-24 ENCOUNTER — Other Ambulatory Visit: Payer: Self-pay

## 2023-08-24 ENCOUNTER — Telehealth: Payer: Self-pay | Admitting: Internal Medicine

## 2023-08-24 NOTE — Telephone Encounter (Signed)
 Scheduled appointments with the patient around CT scan expected date.

## 2023-08-25 ENCOUNTER — Other Ambulatory Visit: Payer: Self-pay

## 2023-09-12 DIAGNOSIS — R399 Unspecified symptoms and signs involving the genitourinary system: Secondary | ICD-10-CM | POA: Diagnosis not present

## 2023-09-12 DIAGNOSIS — C3492 Malignant neoplasm of unspecified part of left bronchus or lung: Secondary | ICD-10-CM | POA: Diagnosis not present

## 2023-09-12 DIAGNOSIS — E7800 Pure hypercholesterolemia, unspecified: Secondary | ICD-10-CM | POA: Diagnosis not present

## 2023-09-12 DIAGNOSIS — G4700 Insomnia, unspecified: Secondary | ICD-10-CM | POA: Diagnosis not present

## 2023-09-14 DIAGNOSIS — R399 Unspecified symptoms and signs involving the genitourinary system: Secondary | ICD-10-CM | POA: Diagnosis not present

## 2023-09-14 DIAGNOSIS — E7800 Pure hypercholesterolemia, unspecified: Secondary | ICD-10-CM | POA: Diagnosis not present

## 2023-09-28 DIAGNOSIS — R918 Other nonspecific abnormal finding of lung field: Secondary | ICD-10-CM | POA: Diagnosis not present

## 2023-09-28 DIAGNOSIS — Z79899 Other long term (current) drug therapy: Secondary | ICD-10-CM | POA: Diagnosis not present

## 2023-09-28 DIAGNOSIS — K219 Gastro-esophageal reflux disease without esophagitis: Secondary | ICD-10-CM | POA: Diagnosis not present

## 2023-09-28 DIAGNOSIS — E7800 Pure hypercholesterolemia, unspecified: Secondary | ICD-10-CM | POA: Diagnosis not present

## 2023-09-28 DIAGNOSIS — Z86711 Personal history of pulmonary embolism: Secondary | ICD-10-CM | POA: Diagnosis not present

## 2023-09-28 DIAGNOSIS — Z9889 Other specified postprocedural states: Secondary | ICD-10-CM | POA: Diagnosis not present

## 2023-09-28 DIAGNOSIS — Z7901 Long term (current) use of anticoagulants: Secondary | ICD-10-CM | POA: Diagnosis not present

## 2023-09-28 DIAGNOSIS — Z87891 Personal history of nicotine dependence: Secondary | ICD-10-CM | POA: Diagnosis not present

## 2023-09-28 DIAGNOSIS — R9389 Abnormal findings on diagnostic imaging of other specified body structures: Secondary | ICD-10-CM | POA: Diagnosis not present

## 2023-09-28 DIAGNOSIS — R053 Chronic cough: Secondary | ICD-10-CM | POA: Diagnosis not present

## 2023-09-28 DIAGNOSIS — Z85118 Personal history of other malignant neoplasm of bronchus and lung: Secondary | ICD-10-CM | POA: Diagnosis not present

## 2023-09-28 DIAGNOSIS — G629 Polyneuropathy, unspecified: Secondary | ICD-10-CM | POA: Diagnosis not present

## 2023-09-28 DIAGNOSIS — J9809 Other diseases of bronchus, not elsewhere classified: Secondary | ICD-10-CM | POA: Diagnosis not present

## 2023-09-28 DIAGNOSIS — J439 Emphysema, unspecified: Secondary | ICD-10-CM | POA: Diagnosis not present

## 2023-12-18 ENCOUNTER — Inpatient Hospital Stay: Attending: Internal Medicine

## 2023-12-18 ENCOUNTER — Ambulatory Visit (HOSPITAL_COMMUNITY)
Admission: RE | Admit: 2023-12-18 | Discharge: 2023-12-18 | Disposition: A | Source: Ambulatory Visit | Attending: Internal Medicine | Admitting: Internal Medicine

## 2023-12-18 DIAGNOSIS — C3490 Malignant neoplasm of unspecified part of unspecified bronchus or lung: Secondary | ICD-10-CM | POA: Insufficient documentation

## 2023-12-18 DIAGNOSIS — Z902 Acquired absence of lung [part of]: Secondary | ICD-10-CM | POA: Diagnosis not present

## 2023-12-18 DIAGNOSIS — Z85118 Personal history of other malignant neoplasm of bronchus and lung: Secondary | ICD-10-CM | POA: Insufficient documentation

## 2023-12-18 DIAGNOSIS — J209 Acute bronchitis, unspecified: Secondary | ICD-10-CM | POA: Diagnosis present

## 2023-12-18 LAB — CMP (CANCER CENTER ONLY)
ALT: 29 U/L (ref 0–44)
AST: 25 U/L (ref 15–41)
Albumin: 4.5 g/dL (ref 3.5–5.0)
Alkaline Phosphatase: 75 U/L (ref 38–126)
Anion gap: 10 (ref 5–15)
BUN: 15 mg/dL (ref 8–23)
CO2: 27 mmol/L (ref 22–32)
Calcium: 9.7 mg/dL (ref 8.9–10.3)
Chloride: 103 mmol/L (ref 98–111)
Creatinine: 0.73 mg/dL (ref 0.61–1.24)
GFR, Estimated: >60 mL/min (ref >60)
Glucose, Bld: 96 mg/dL (ref 70–99)
Potassium: 4 mmol/L (ref 3.5–5.1)
Sodium: 139 mmol/L (ref 135–145)
Total Bilirubin: 0.5 mg/dL (ref 0.0–1.2)
Total Protein: 7.7 g/dL (ref 6.5–8.1)

## 2023-12-18 LAB — CBC WITH DIFFERENTIAL (CANCER CENTER ONLY)
Abs Immature Granulocytes: 0.01 K/uL (ref 0.00–0.07)
Basophils Absolute: 0.1 K/uL (ref 0.0–0.1)
Basophils Relative: 1 %
Eosinophils Absolute: 0.1 K/uL (ref 0.0–0.5)
Eosinophils Relative: 1 %
HCT: 37 % — ABNORMAL LOW (ref 39.0–52.0)
Hemoglobin: 12.5 g/dL — ABNORMAL LOW (ref 13.0–17.0)
Immature Granulocytes: 0 %
Lymphocytes Relative: 41 %
Lymphs Abs: 3.5 K/uL (ref 0.7–4.0)
MCH: 29.6 pg (ref 26.0–34.0)
MCHC: 33.8 g/dL (ref 30.0–36.0)
MCV: 87.5 fL (ref 80.0–100.0)
Monocytes Absolute: 0.8 K/uL (ref 0.1–1.0)
Monocytes Relative: 9 %
Neutro Abs: 4 K/uL (ref 1.7–7.7)
Neutrophils Relative %: 48 %
Platelet Count: 195 K/uL (ref 150–400)
RBC: 4.23 MIL/uL (ref 4.22–5.81)
RDW: 13.4 % (ref 11.5–15.5)
WBC Count: 8.4 K/uL (ref 4.0–10.5)
nRBC: 0 % (ref 0.0–0.2)

## 2023-12-18 MED ORDER — IOHEXOL 300 MG/ML  SOLN
75.0000 mL | Freq: Once | INTRAMUSCULAR | Status: AC | PRN
  Administered 2023-12-18: 75 mL via INTRAVENOUS

## 2023-12-18 MED ORDER — SODIUM CHLORIDE (PF) 0.9 % IJ SOLN
INTRAMUSCULAR | Status: AC
  Filled 2023-12-18: qty 50

## 2023-12-25 ENCOUNTER — Inpatient Hospital Stay: Admitting: Internal Medicine

## 2023-12-25 VITALS — BP 129/79 | HR 96 | Temp 97.8°F | Resp 17 | Ht 68.0 in | Wt 216.0 lb

## 2023-12-25 DIAGNOSIS — J209 Acute bronchitis, unspecified: Secondary | ICD-10-CM | POA: Diagnosis not present

## 2023-12-25 DIAGNOSIS — C3490 Malignant neoplasm of unspecified part of unspecified bronchus or lung: Secondary | ICD-10-CM | POA: Diagnosis not present

## 2023-12-25 MED ORDER — AZITHROMYCIN 250 MG PO TABS: ORAL_TABLET | ORAL | 0 refills | Status: AC

## 2023-12-25 NOTE — Progress Notes (Signed)
 [facility] [phone]   [phone]  OFFICE PROGRESS NOTE  [name], DO [address] [place] [address]  DIAGNOSIS: Stage IIB (T3, N0, M0) non-small cell lung cancer, squamous cell carcinoma presented with left suprahilar mass with suspicious postobstructive pneumonitis/lymphangitic spread and satellite nodules diagnosed in November 2024.   Biomarker Findings HRD signature - HRDsig Negative Microsatellite status - MS-Stable Tumor Mutational Burden - 7 Muts/Mb Genomic Findings For a complete list of the genes assayed, please refer to the Appendix. PTCH1 K529* RB1 splice site 1499-2A>T TP53 H178P, E271K 8 Disease relevant genes with no reportable alterations: ALK, BRAF, EGFR, ERBB2, KRAS, MET, RET, ROS1  PDL1 TPS: 90%  PRIOR THERAPY:  1) Neoadjuvant chemoimmunotherapy with carboplatin  for AUC of 6, paclitaxel  200 Mg/M2 and nivolumab  360 Mg IV every 3 weeks with Neulasta  support for total of 4 cycles.  Status post 4 cycles. 2) left upper lobectomy with lymph node dissection under the care of Dr. Elmo at [facility] and the final pathology showed no evidence for residual disease on 04/19/2023.  CURRENT THERAPY: Observation  INTERVAL HISTORY: Raymond Velez 68 y.o. male returns to the clinic today for follow-up visit.  Discussed the use of AI scribe software for clinical note transcription with the patient, who gave verbal consent to proceed.  History of Present Illness Raymond Velez is a 68 year old male with stage 2B non-small cell lung cancer, status post neoadjuvant chemoimmunotherapy and left upper lobectomy, who presents for routine surveillance and restaging imaging.  Diagnosed with stage 2B non-small cell lung cancer, squamous cell carcinoma of the left upper lobe in November 2024, he completed neoadjuvant chemoimmunotherapy followed by left upper lobectomy with lymph node dissection. Final pathology demonstrated  no residual disease. At this visit, he presents for evaluation and repeat chest CT for restaging.  Since the last visit, he has been generally well. He developed a productive cough following exposure to his wife with upper respiratory symptoms. Sputum is described as thick and occasionally green, without hemoptysis. He denies fever, chills, chest pain, or dyspnea. He has used over-the-counter medications, including Nyquil and Robitussin, with some symptomatic relief. Imaging showed bronchial wall thickening.  He received a recent COVID vaccine. He has no medication allergies.    MEDICAL HISTORY: Past Medical History:  Diagnosis Date   Arthritis    Hyperlipidemia    RBBB     ALLERGIES:  has no known allergies.  MEDICATIONS:  Current Outpatient Medications  Medication Sig Dispense Refill   atorvastatin  (LIPITOR) 20 MG tablet Take 20 mg by mouth daily.     No current facility-administered medications for this visit.   Facility-Administered Medications Ordered in Other Visits  Medication Dose Route Frequency Provider Last Rate Last Admin   0.9 %  sodium chloride  infusion   Intravenous Continuous Sherrod Sherrod, MD   Stopped at 03/01/23 1417    SURGICAL HISTORY:  Past Surgical History:  Procedure Laterality Date   BRONCHIAL BIOPSY  11/14/2022   Procedure: BRONCHIAL BIOPSIES;  Surgeon: Shelah Lamar RAMAN, MD;  Location: [facility] ENDOSCOPY;  Service: Pulmonary;;   BRONCHIAL BRUSHINGS  11/14/2022   Procedure: BRONCHIAL BRUSHINGS;  Surgeon: Shelah Lamar RAMAN, MD;  Location: [facility] ENDOSCOPY;  Service: Pulmonary;;   COLONOSCOPY      x 3   HEMOSTASIS CONTROL  11/14/2022   Procedure: HEMOSTASIS CONTROL;  Surgeon: Shelah Lamar RAMAN, MD;  Location: [facility] ENDOSCOPY;  Service: Pulmonary;;  epi and cold saline   TOTAL HIP ARTHROPLASTY  Left 10/09/2018   Procedure: TOTAL HIP ARTHROPLASTY ANTERIOR APPROACH;  Surgeon: Ernie Cough, MD;  Location: [facility] ORS;  Service: Orthopedics;  Laterality: Left;  70 mins   VIDEO  BRONCHOSCOPY N/A 11/14/2022   Procedure: VIDEO BRONCHOSCOPY;  Surgeon: Shelah Lamar RAMAN, MD;  Location: [facility] ENDOSCOPY;  Service: Pulmonary;  Laterality: N/A;    REVIEW OF SYSTEMS:  Constitutional: positive for fatigue Eyes: negative Ears, nose, mouth, throat, and face: negative Respiratory: positive for cough and sputum Cardiovascular: negative Gastrointestinal: negative Genitourinary:negative Integument/breast: negative Hematologic/lymphatic: negative Musculoskeletal:negative Neurological: negative Behavioral/Psych: negative Endocrine: negative Allergic/Immunologic: negative   PHYSICAL EXAMINATION: General appearance: alert, cooperative, fatigued, and no distress Head: Normocephalic, without obvious abnormality, atraumatic Neck: no adenopathy, no JVD, supple, symmetrical, trachea midline, and thyroid  not enlarged, symmetric, no tenderness/mass/nodules Lymph nodes: Cervical, supraclavicular, and axillary nodes normal. Resp: clear to auscultation bilaterally Back: symmetric, no curvature. ROM normal. No CVA tenderness. Cardio: regular rate and rhythm, S1, S2 normal, no murmur, click, rub or gallop GI: soft, non-tender; bowel sounds normal; no masses,  no organomegaly Extremities: extremities normal, atraumatic, no cyanosis or edema Neurologic: Alert and oriented X 3, normal strength and tone. Normal symmetric reflexes. Normal coordination and gait  ECOG PERFORMANCE STATUS: 1 - Symptomatic but completely ambulatory  Blood pressure 129/79, pulse 96, temperature 97.8 F (36.6 C), temperature source Temporal, resp. rate 17, height 5' 8 (1.727 m), weight 216 lb (98 kg), SpO2 99%.  LABORATORY DATA: Lab Results  Component Value Date   WBC 8.4 12/18/2023   HGB 12.5 (L) 12/18/2023   HCT 37.0 (L) 12/18/2023   MCV 87.5 12/18/2023   PLT 195 12/18/2023      Chemistry      Component Value Date/Time   NA 139 12/18/2023 1223   K 4.0 12/18/2023 1223   CL 103 12/18/2023 1223   CO2 27  12/18/2023 1223   BUN 15 12/18/2023 1223   CREATININE 0.73 12/18/2023 1223   CREATININE 0.69 (L) 09/24/2014 0828      Component Value Date/Time   CALCIUM  9.7 12/18/2023 1223   ALKPHOS 75 12/18/2023 1223   AST 25 12/18/2023 1223   ALT 29 12/18/2023 1223   BILITOT 0.5 12/18/2023 1223       RADIOGRAPHIC STUDIES: CT Chest W Contrast Result Date: 12/22/2023 EXAM: CT CHEST WITH CONTRAST 12/18/2023 01:36:36 PM TECHNIQUE: CT of the chest was performed with the administration of 75 mL iohexol  (OMNIPAQUE ) 300 MG/ML solution. Multiplanar reformatted images are provided for review. Automated exposure control, iterative reconstruction, and/or weight based adjustment of the mA/kV was utilized to reduce the radiation dose to as low as reasonably achievable. COMPARISON: 11/04/2022. CLINICAL HISTORY: Non-small cell lung cancer (NSCLC), staging. Non-small cell lung cancer (NSCLC), staging. * Tracking Code: BO * FINDINGS: MEDIASTINUM: Heart and pericardium are unremarkable. The central airways are clear. Thoracic aortic, coronary artery, and branch vessel atheromatous vascular calcifications. Mitral valve calcifications and faint aortic valve calcifications. LYMPH NODES: No current pathologic thoracic adenopathy observed. No mediastinal, hilar or axillary lymphadenopathy. LUNGS AND PLEURA: Interval left upper lobectomy. Paraseptal emphysema. Subsolid 0.5 cm stable nodule at the apical segment right upper lobe, image 22 series 6, no change back through earliest available comparison of 10/07/2022, surveillance suggested. Airway thickening suggest bronchitis or reactive airways disease. Calcified benign granuloma in the right middle lobe. No focal consolidation or pulmonary edema. No pleural effusion or pneumothorax. SOFT TISSUES/BONES: Left 6th rib osteotomy posteriorly. Mild thoracic spondylosis. No acute abnormality of the bones or soft tissues. UPPER ABDOMEN: Limited  images of the upper abdomen demonstrates no acute  abnormality. IMPRESSION: 1. No findings of active malignancy. 2. Interval left upper lobectomy. 3. Paraseptal emphysema. 4. Airway thickening suggestive of bronchitis or reactive airways disease. 5. Subsolid 0.5 cm stable right upper lobe apical nodule, unchanged since 10/07/2022. Surveillance suggested. 6. Thoracic aortic, coronary artery, and branch vessel atherosclerotic calcifications, with mitral and faint aortic valvular calcifications. 7. Mild thoracic spondylosis. Electronically signed by: Ryan Salvage MD 12/22/2023 08:51 AM EST RP Workstation: [id number]    ASSESSMENT AND PLAN: This is a very pleasant 68 years old white male with Stage IIB (T3, N0, M0) non-small cell lung cancer, squamous cell carcinoma presented with left suprahilar mass with suspicious postobstructive pneumonitis/lymphangitic spread and satellite nodules diagnosed in November 2024.  He is currently undergoing neoadjuvant chemoimmunotherapy with carboplatin  for AUC of 6, paclitaxel  200 Mg/M2 and nivolumab  360 mg IV every 3 weeks with growth factor support status post 4 cycles. He underwent left upper lobectomy with lymph node dissection under the care of Dr. Elmo at [facility] and the final pathology showed no evidence of residual disease. The patient is currently on observation and he is feeling fine he is not except for cough productive of yellowish sputum suspicious for acute bronchitis. He had repeat CT scan of the chest performed recently.  I personally independently reviewed the scan and discussed the result with the patient today. Assessment and Plan Assessment & Plan Acute bronchitis He developed a productive cough with thick, occasionally green sputum after exposure to his wife's upper respiratory infection. Denies hemoptysis, fever, chills, chest pain, or dyspnea. Imaging revealed bronchial thickening consistent with bronchitis. Symptoms are likely infectious and mild, without evidence of pneumonia or  severe infection. - Prescribed azithromycin  (Z-pack): two capsules on day one, then one capsule daily for four days. - Sent prescription to preferred [facility] pharmacy.  Stage 2B non-small cell lung cancer of the left upper lobe, post-resection, in remission Status post neoadjuvant chemoimmunotherapy and left upper lobectomy with lymph node dissection for stage 2B squamous cell carcinoma. He remains in remission with no evidence of residual or recurrent disease on recent chest CT. Currently under surveillance. - Reviewed recent chest CT, which showed no evidence of recurrence or metastasis. - Scheduled follow-up in six months for ongoing surveillance. Patient was advised to call immediately if he has any concerning symptoms in the interval.  The patient voices understanding of current disease status and treatment options and is in agreement with the current care plan.  All questions were answered. The patient knows to call the clinic with any problems, questions or concerns. We can certainly see the patient much sooner if necessary.  The total time spent in the appointment was 30 minutes.  Disclaimer: This note was dictated with voice recognition software. Similar sounding words can inadvertently be transcribed and may not be corrected upon review.

## 2023-12-26 ENCOUNTER — Other Ambulatory Visit: Payer: Self-pay

## 2024-06-17 ENCOUNTER — Inpatient Hospital Stay

## 2024-06-25 ENCOUNTER — Inpatient Hospital Stay: Admitting: Internal Medicine
